# Patient Record
Sex: Female | Born: 1965
Health system: Southern US, Community
[De-identification: ages and names within clinical notes are randomized; demographics above are authoritative.]

## PROBLEM LIST (undated history)

## (undated) DIAGNOSIS — F419 Anxiety disorder, unspecified: Secondary | ICD-10-CM

## (undated) DIAGNOSIS — G4700 Insomnia, unspecified: Secondary | ICD-10-CM

## (undated) DIAGNOSIS — T7840XA Allergy, unspecified, initial encounter: Secondary | ICD-10-CM

## (undated) DIAGNOSIS — I639 Cerebral infarction, unspecified: Secondary | ICD-10-CM

## (undated) DIAGNOSIS — E785 Hyperlipidemia, unspecified: Secondary | ICD-10-CM

## (undated) DIAGNOSIS — F329 Major depressive disorder, single episode, unspecified: Secondary | ICD-10-CM

## (undated) DIAGNOSIS — F32A Depression, unspecified: Secondary | ICD-10-CM

## (undated) DIAGNOSIS — I10 Essential (primary) hypertension: Secondary | ICD-10-CM

## (undated) HISTORY — DX: Anxiety disorder, unspecified: F41.9

## (undated) HISTORY — DX: Hyperlipidemia, unspecified: E78.5

## (undated) HISTORY — PX: OTHER SURGICAL HISTORY: SHX169

## (undated) HISTORY — DX: Allergy, unspecified, initial encounter: T78.40XA

## (undated) HISTORY — DX: Insomnia, unspecified: G47.00

## (undated) HISTORY — DX: Essential (primary) hypertension: I10

## (undated) HISTORY — DX: Major depressive disorder, single episode, unspecified: F32.9

## (undated) HISTORY — PX: BREAST BIOPSY: SHX20

## (undated) HISTORY — DX: Depression, unspecified: F32.A

## (undated) HISTORY — PX: CHOLECYSTECTOMY: SHX55

## (undated) HISTORY — DX: Cerebral infarction, unspecified: I63.9

---

## 1996-03-10 HISTORY — PX: OTHER SURGICAL HISTORY: SHX169

## 1997-10-17 ENCOUNTER — Encounter: Admission: RE | Admit: 1997-10-17 | Discharge: 1997-10-17 | Payer: Self-pay | Admitting: Family Medicine

## 1998-01-17 ENCOUNTER — Encounter: Admission: RE | Admit: 1998-01-17 | Discharge: 1998-01-17 | Payer: Self-pay | Admitting: Family Medicine

## 1998-02-07 ENCOUNTER — Emergency Department (HOSPITAL_COMMUNITY): Admission: EM | Admit: 1998-02-07 | Discharge: 1998-02-07 | Payer: Self-pay | Admitting: Emergency Medicine

## 1998-02-14 ENCOUNTER — Encounter: Admission: RE | Admit: 1998-02-14 | Discharge: 1998-02-14 | Payer: Self-pay | Admitting: Family Medicine

## 1998-06-21 ENCOUNTER — Encounter: Admission: RE | Admit: 1998-06-21 | Discharge: 1998-06-21 | Payer: Self-pay | Admitting: Family Medicine

## 1998-08-23 ENCOUNTER — Encounter: Admission: RE | Admit: 1998-08-23 | Discharge: 1998-08-23 | Payer: Self-pay | Admitting: Family Medicine

## 2003-03-11 HISTORY — PX: OTHER SURGICAL HISTORY: SHX169

## 2003-08-25 ENCOUNTER — Ambulatory Visit (HOSPITAL_COMMUNITY): Admission: RE | Admit: 2003-08-25 | Discharge: 2003-08-25 | Payer: Self-pay | Admitting: General Surgery

## 2004-02-28 ENCOUNTER — Ambulatory Visit: Payer: Self-pay | Admitting: Family Medicine

## 2004-02-28 ENCOUNTER — Ambulatory Visit (HOSPITAL_COMMUNITY): Admission: RE | Admit: 2004-02-28 | Discharge: 2004-02-28 | Payer: Self-pay | Admitting: Family Medicine

## 2004-03-10 HISTORY — PX: OTHER SURGICAL HISTORY: SHX169

## 2004-03-18 ENCOUNTER — Ambulatory Visit (HOSPITAL_COMMUNITY): Admission: RE | Admit: 2004-03-18 | Discharge: 2004-03-18 | Payer: Self-pay | Admitting: Family Medicine

## 2004-03-22 ENCOUNTER — Ambulatory Visit (HOSPITAL_COMMUNITY): Admission: RE | Admit: 2004-03-22 | Discharge: 2004-03-22 | Payer: Self-pay | Admitting: Thoracic Surgery

## 2004-03-26 ENCOUNTER — Encounter (INDEPENDENT_AMBULATORY_CARE_PROVIDER_SITE_OTHER): Payer: Self-pay | Admitting: *Deleted

## 2004-03-26 ENCOUNTER — Ambulatory Visit (HOSPITAL_COMMUNITY): Admission: RE | Admit: 2004-03-26 | Discharge: 2004-03-26 | Payer: Self-pay | Admitting: Thoracic Surgery

## 2004-03-29 ENCOUNTER — Ambulatory Visit: Payer: Self-pay | Admitting: Pulmonary Disease

## 2004-04-02 ENCOUNTER — Ambulatory Visit: Admission: RE | Admit: 2004-04-02 | Discharge: 2004-04-02 | Payer: Self-pay | Admitting: Pulmonary Disease

## 2004-04-02 ENCOUNTER — Encounter (INDEPENDENT_AMBULATORY_CARE_PROVIDER_SITE_OTHER): Payer: Self-pay | Admitting: Specialist

## 2004-04-02 ENCOUNTER — Encounter (INDEPENDENT_AMBULATORY_CARE_PROVIDER_SITE_OTHER): Payer: Self-pay | Admitting: *Deleted

## 2004-04-02 ENCOUNTER — Ambulatory Visit: Payer: Self-pay | Admitting: Pulmonary Disease

## 2004-04-04 ENCOUNTER — Ambulatory Visit: Payer: Self-pay | Admitting: Pulmonary Disease

## 2004-04-09 ENCOUNTER — Ambulatory Visit: Payer: Self-pay | Admitting: Pulmonary Disease

## 2004-04-22 ENCOUNTER — Ambulatory Visit: Payer: Self-pay

## 2004-05-03 ENCOUNTER — Ambulatory Visit: Payer: Self-pay | Admitting: Pulmonary Disease

## 2004-05-20 ENCOUNTER — Ambulatory Visit (HOSPITAL_COMMUNITY): Admission: RE | Admit: 2004-05-20 | Discharge: 2004-05-20 | Payer: Self-pay | Admitting: Pulmonary Disease

## 2004-06-28 ENCOUNTER — Ambulatory Visit: Payer: Self-pay | Admitting: Pulmonary Disease

## 2004-07-04 ENCOUNTER — Ambulatory Visit: Payer: Self-pay | Admitting: Family Medicine

## 2004-07-24 ENCOUNTER — Encounter (HOSPITAL_COMMUNITY): Admission: RE | Admit: 2004-07-24 | Discharge: 2004-08-23 | Payer: Self-pay | Admitting: Oncology

## 2004-07-24 ENCOUNTER — Encounter: Admission: RE | Admit: 2004-07-24 | Discharge: 2004-07-24 | Payer: Self-pay | Admitting: Oncology

## 2004-07-24 ENCOUNTER — Ambulatory Visit (HOSPITAL_COMMUNITY): Payer: Self-pay | Admitting: Oncology

## 2004-09-06 ENCOUNTER — Ambulatory Visit: Payer: Self-pay | Admitting: Internal Medicine

## 2004-09-06 ENCOUNTER — Ambulatory Visit: Payer: Self-pay | Admitting: Infectious Diseases

## 2004-09-06 ENCOUNTER — Encounter (INDEPENDENT_AMBULATORY_CARE_PROVIDER_SITE_OTHER): Payer: Self-pay | Admitting: Specialist

## 2004-09-06 ENCOUNTER — Inpatient Hospital Stay (HOSPITAL_COMMUNITY): Admission: RE | Admit: 2004-09-06 | Discharge: 2004-09-13 | Payer: Self-pay | Admitting: Thoracic Surgery

## 2004-09-18 ENCOUNTER — Encounter: Admission: RE | Admit: 2004-09-18 | Discharge: 2004-09-18 | Payer: Self-pay | Admitting: Thoracic Surgery

## 2004-10-03 ENCOUNTER — Ambulatory Visit: Payer: Self-pay | Admitting: Family Medicine

## 2004-10-04 ENCOUNTER — Ambulatory Visit: Payer: Self-pay | Admitting: Psychiatry

## 2004-10-09 ENCOUNTER — Encounter: Admission: RE | Admit: 2004-10-09 | Discharge: 2004-10-09 | Payer: Self-pay | Admitting: Thoracic Surgery

## 2004-10-30 ENCOUNTER — Ambulatory Visit: Payer: Self-pay | Admitting: Infectious Diseases

## 2004-11-12 ENCOUNTER — Encounter: Admission: RE | Admit: 2004-11-12 | Discharge: 2004-11-12 | Payer: Self-pay | Admitting: Thoracic Surgery

## 2004-11-14 ENCOUNTER — Ambulatory Visit: Payer: Self-pay | Admitting: Family Medicine

## 2004-12-23 ENCOUNTER — Ambulatory Visit: Payer: Self-pay | Admitting: Family Medicine

## 2005-01-21 ENCOUNTER — Ambulatory Visit (HOSPITAL_COMMUNITY): Admission: RE | Admit: 2005-01-21 | Discharge: 2005-01-21 | Payer: Self-pay | Admitting: Obstetrics and Gynecology

## 2005-02-24 ENCOUNTER — Emergency Department (HOSPITAL_COMMUNITY): Admission: EM | Admit: 2005-02-24 | Discharge: 2005-02-24 | Payer: Self-pay | Admitting: Emergency Medicine

## 2005-02-24 ENCOUNTER — Ambulatory Visit: Payer: Self-pay | Admitting: Orthopedic Surgery

## 2005-03-07 ENCOUNTER — Ambulatory Visit (HOSPITAL_COMMUNITY): Admission: RE | Admit: 2005-03-07 | Discharge: 2005-03-07 | Payer: Self-pay | Admitting: Orthopedic Surgery

## 2005-04-03 ENCOUNTER — Ambulatory Visit: Payer: Self-pay | Admitting: Orthopedic Surgery

## 2005-07-10 ENCOUNTER — Ambulatory Visit (HOSPITAL_COMMUNITY): Admission: RE | Admit: 2005-07-10 | Discharge: 2005-07-10 | Payer: Self-pay | Admitting: Family Medicine

## 2005-07-10 ENCOUNTER — Ambulatory Visit: Payer: Self-pay | Admitting: Family Medicine

## 2005-08-18 ENCOUNTER — Ambulatory Visit: Payer: Self-pay | Admitting: Family Medicine

## 2006-01-01 ENCOUNTER — Ambulatory Visit: Payer: Self-pay | Admitting: Family Medicine

## 2006-01-02 ENCOUNTER — Ambulatory Visit (HOSPITAL_COMMUNITY): Admission: RE | Admit: 2006-01-02 | Discharge: 2006-01-02 | Payer: Self-pay | Admitting: Family Medicine

## 2006-01-28 IMAGING — CT CT CHEST W/ CM
1 of 2 series · 14 of 32 positions shown, 18 images · IV contrast (CONTRAST)
Comparison: none

CLINICAL DATA: Pulmonary masses in the right lower lobe.  The patient has reportedly been biopsied twice with no diagnosis of malignancy.
CT CHEST WITH INTRAVENOUS CONTRAST:
There are multiple nodules of various sizes in the right lower lobe, unchanged since several prior exams. The right middle and upper lobes and the entire left lung are clear. There is no hilar or mediastinal adenopathy.  The heart size is normal.  There is no bony abnormality.   The visualized portion of the abdomen which includes the entire liver, spleen, pancreas, adrenal glands and the majority of each kidney appear normal.

[Series 9746: — · axial · 0.64mm/px · z∈[+1563,+1813]mm · 14 of 60 slices shown, 18 images]
[im 5/60  mediastinal]
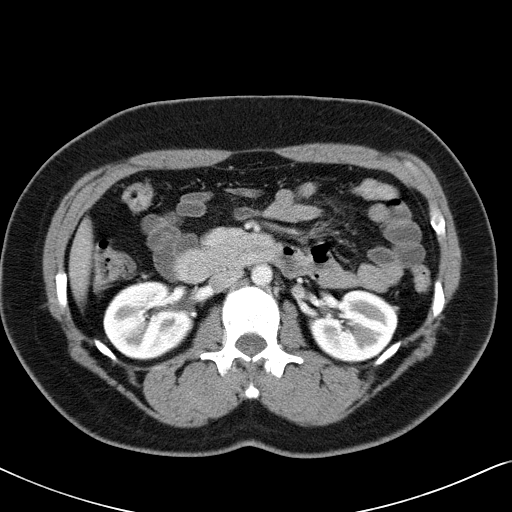
[im 5/60  lung]
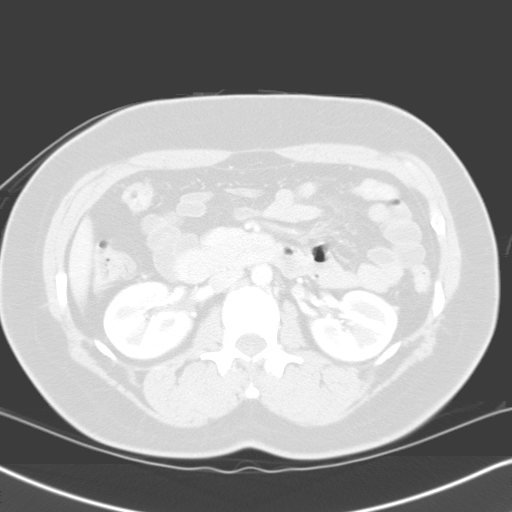
[im 10/60  lung]
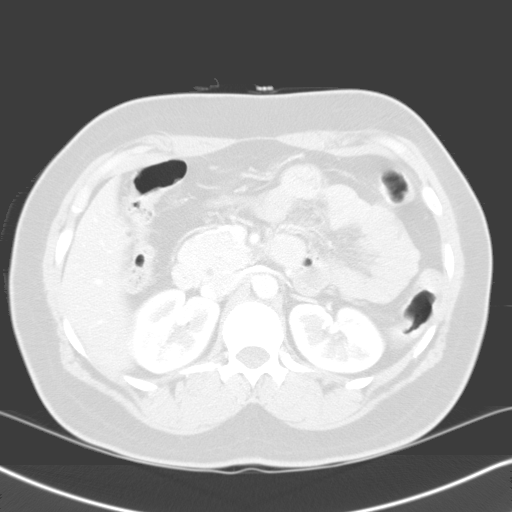
[im 14/60  lung]
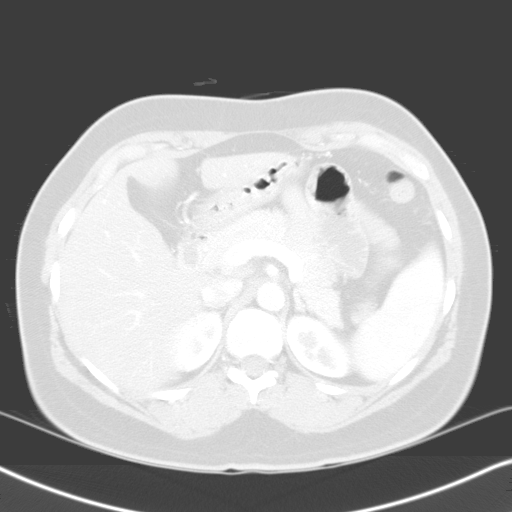
[im 19/60  lung]
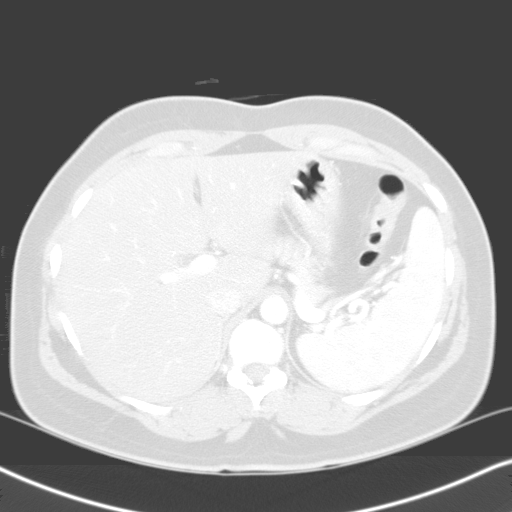
[im 23/60  mediastinal]
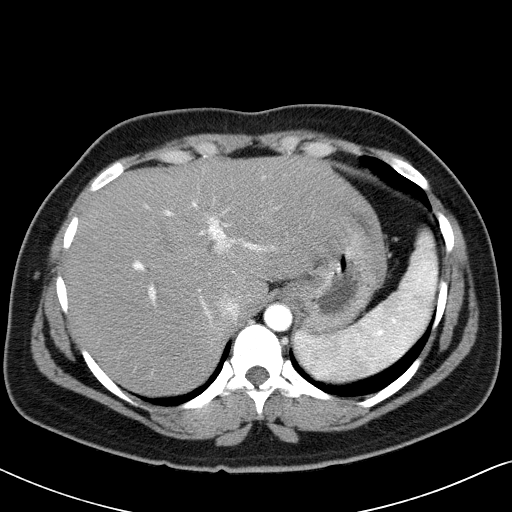
[im 23/60  lung]
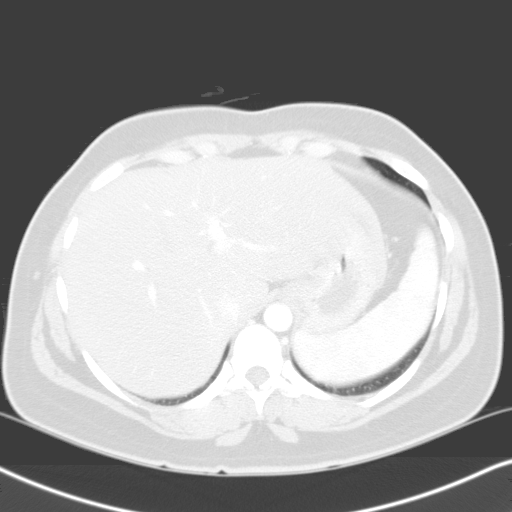
[im 28/60  lung]
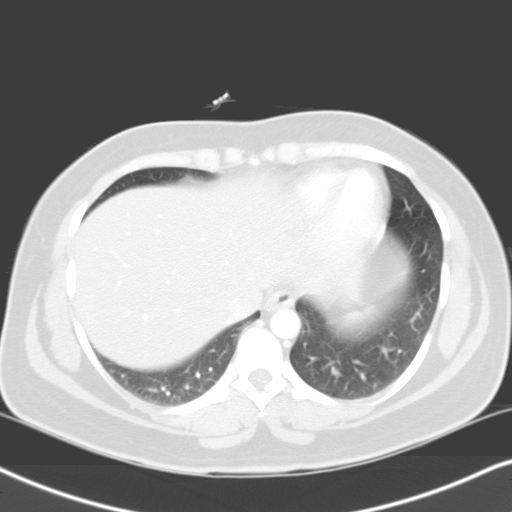
[im 29/60  lung]
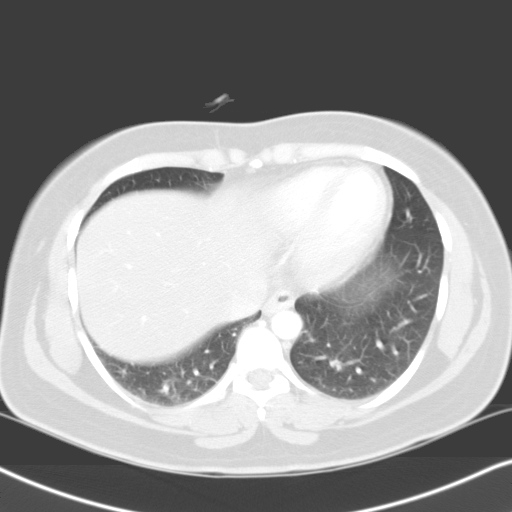
[im 30/60  lung]
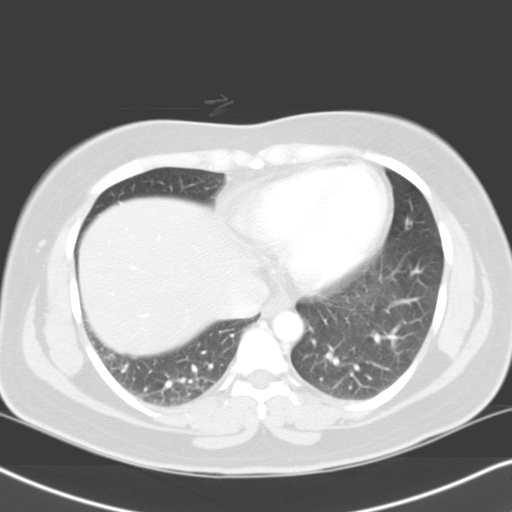
[im 32/60  mediastinal]
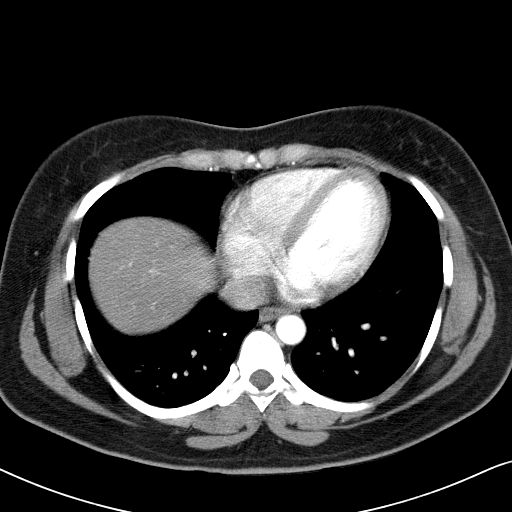
[im 32/60  lung]
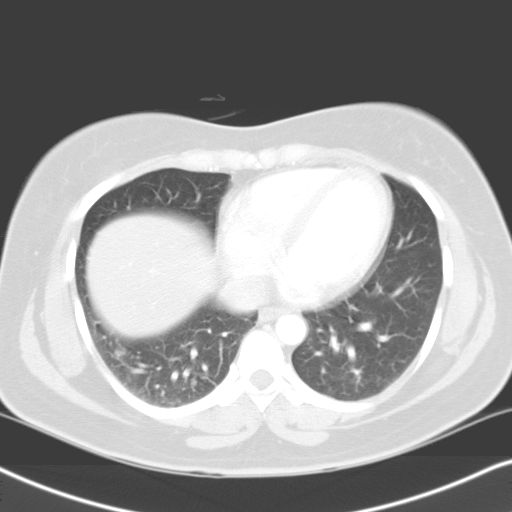
[im 37/60  lung]
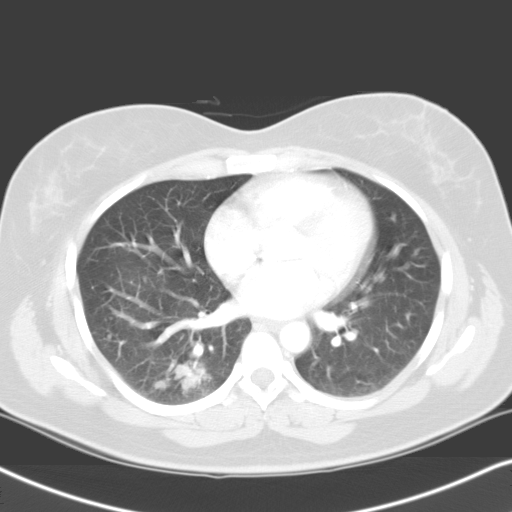
[im 41/60  lung]
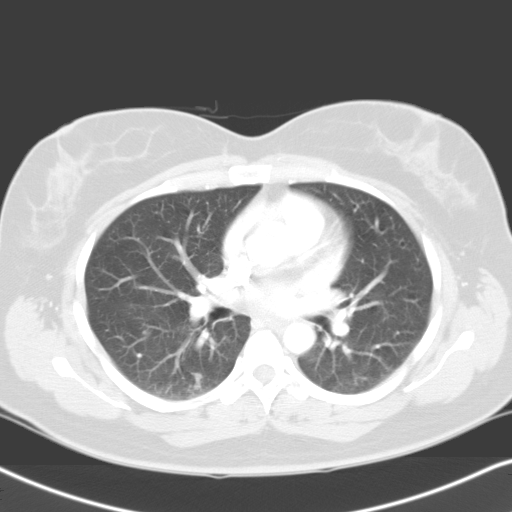
[im 46/60  lung]
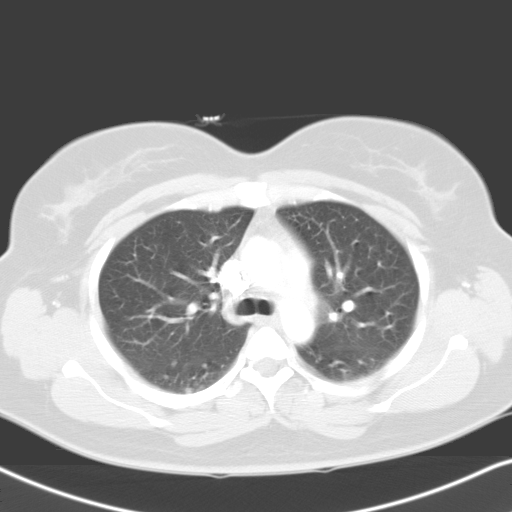
[im 50/60  mediastinal]
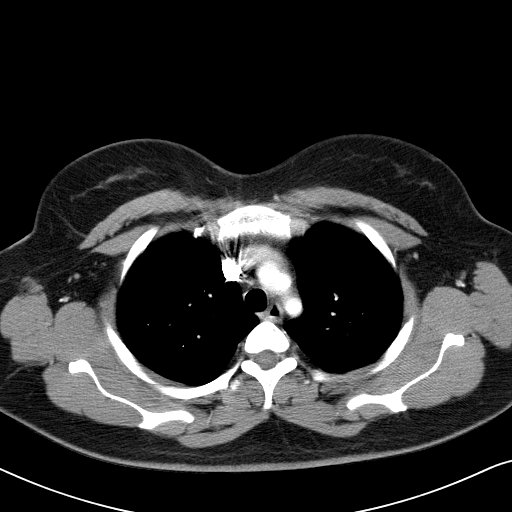
[im 50/60  lung]
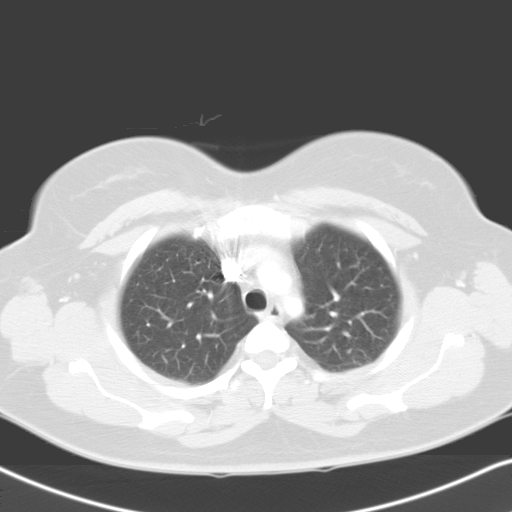
[im 55/60  lung]
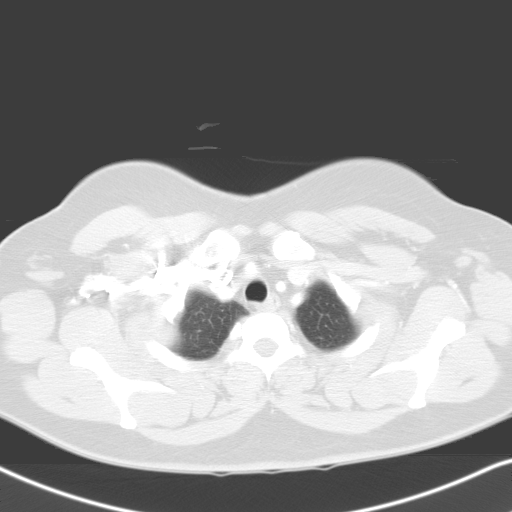

[14 of 32 positions shown; findings below may reference images not displayed]

IMPRESSION: Stable extensive pulmonary nodules in the right lower lobe. The findings are unusual, given their extent and stability and lack of adenopathy. However, an unusual malignancy should be considered.

## 2006-02-26 ENCOUNTER — Ambulatory Visit (HOSPITAL_COMMUNITY): Admission: RE | Admit: 2006-02-26 | Discharge: 2006-02-26 | Payer: Self-pay | Admitting: Obstetrics and Gynecology

## 2006-06-02 ENCOUNTER — Ambulatory Visit: Payer: Self-pay | Admitting: Family Medicine

## 2006-06-03 ENCOUNTER — Encounter: Payer: Self-pay | Admitting: Family Medicine

## 2006-06-30 ENCOUNTER — Ambulatory Visit: Payer: Self-pay | Admitting: Family Medicine

## 2007-02-02 ENCOUNTER — Ambulatory Visit: Payer: Self-pay | Admitting: Family Medicine

## 2007-02-03 ENCOUNTER — Encounter: Payer: Self-pay | Admitting: Family Medicine

## 2007-04-14 ENCOUNTER — Ambulatory Visit: Payer: Self-pay | Admitting: Family Medicine

## 2007-05-05 ENCOUNTER — Encounter: Payer: Self-pay | Admitting: Family Medicine

## 2007-05-05 LAB — CONVERTED CEMR LAB
AST: 14 units/L (ref 0–37)
Albumin: 4.3 g/dL (ref 3.5–5.2)
Alkaline Phosphatase: 80 units/L (ref 39–117)
Basophils Absolute: 0 10*3/uL (ref 0.0–0.1)
Basophils Relative: 0 % (ref 0–1)
CO2: 23 meq/L (ref 19–32)
Calcium: 9.2 mg/dL (ref 8.4–10.5)
Chloride: 105 meq/L (ref 96–112)
Eosinophils Absolute: 0.1 10*3/uL (ref 0.0–0.7)
Eosinophils Relative: 1 % (ref 0–5)
Glucose, Bld: 80 mg/dL (ref 70–99)
HCT: 39.5 % (ref 36.0–46.0)
LDL Cholesterol: 103 mg/dL — ABNORMAL HIGH (ref 0–99)
MCHC: 32.9 g/dL (ref 30.0–36.0)
MCV: 93.2 fL (ref 78.0–100.0)
Monocytes Absolute: 0.5 10*3/uL (ref 0.1–1.0)
RBC: 4.24 M/uL (ref 3.87–5.11)
Sodium: 138 meq/L (ref 135–145)
Total Bilirubin: 0.3 mg/dL (ref 0.3–1.2)
Total CHOL/HDL Ratio: 4
WBC: 9.8 10*3/uL (ref 4.0–10.5)

## 2007-05-06 ENCOUNTER — Ambulatory Visit: Payer: Self-pay | Admitting: Family Medicine

## 2007-05-26 ENCOUNTER — Ambulatory Visit (HOSPITAL_COMMUNITY): Admission: RE | Admit: 2007-05-26 | Discharge: 2007-05-26 | Payer: Self-pay | Admitting: Family Medicine

## 2007-05-26 ENCOUNTER — Ambulatory Visit: Payer: Self-pay | Admitting: Family Medicine

## 2007-05-29 ENCOUNTER — Telehealth (INDEPENDENT_AMBULATORY_CARE_PROVIDER_SITE_OTHER): Payer: Self-pay | Admitting: Internal Medicine

## 2007-05-30 ENCOUNTER — Emergency Department (HOSPITAL_COMMUNITY): Admission: EM | Admit: 2007-05-30 | Discharge: 2007-05-30 | Payer: Self-pay | Admitting: Emergency Medicine

## 2007-08-04 DIAGNOSIS — E785 Hyperlipidemia, unspecified: Secondary | ICD-10-CM | POA: Insufficient documentation

## 2007-08-04 DIAGNOSIS — F5105 Insomnia due to other mental disorder: Secondary | ICD-10-CM

## 2007-08-04 DIAGNOSIS — F409 Phobic anxiety disorder, unspecified: Secondary | ICD-10-CM | POA: Insufficient documentation

## 2007-08-04 DIAGNOSIS — R32 Unspecified urinary incontinence: Secondary | ICD-10-CM | POA: Insufficient documentation

## 2007-08-11 ENCOUNTER — Ambulatory Visit: Payer: Self-pay | Admitting: Family Medicine

## 2007-08-11 ENCOUNTER — Encounter: Payer: Self-pay | Admitting: Family Medicine

## 2007-09-07 ENCOUNTER — Ambulatory Visit (HOSPITAL_COMMUNITY): Admission: RE | Admit: 2007-09-07 | Discharge: 2007-09-07 | Payer: Self-pay | Admitting: Obstetrics and Gynecology

## 2007-10-18 ENCOUNTER — Other Ambulatory Visit: Admission: RE | Admit: 2007-10-18 | Discharge: 2007-10-18 | Payer: Self-pay | Admitting: Obstetrics and Gynecology

## 2007-11-24 ENCOUNTER — Ambulatory Visit: Payer: Self-pay | Admitting: Family Medicine

## 2007-12-24 ENCOUNTER — Emergency Department (HOSPITAL_COMMUNITY): Admission: EM | Admit: 2007-12-24 | Discharge: 2007-12-24 | Payer: Self-pay | Admitting: Emergency Medicine

## 2008-01-06 ENCOUNTER — Ambulatory Visit: Payer: Self-pay | Admitting: Family Medicine

## 2008-01-06 DIAGNOSIS — N3001 Acute cystitis with hematuria: Secondary | ICD-10-CM | POA: Insufficient documentation

## 2008-01-06 LAB — CONVERTED CEMR LAB
Glucose, Urine, Semiquant: NEGATIVE
Protein, U semiquant: NEGATIVE
Specific Gravity, Urine: 1.015
Urobilinogen, UA: 0.2

## 2008-01-07 ENCOUNTER — Encounter: Payer: Self-pay | Admitting: Family Medicine

## 2008-01-18 ENCOUNTER — Encounter: Payer: Self-pay | Admitting: Family Medicine

## 2008-02-23 ENCOUNTER — Ambulatory Visit: Payer: Self-pay | Admitting: Family Medicine

## 2008-02-23 DIAGNOSIS — R5383 Other fatigue: Secondary | ICD-10-CM

## 2008-02-23 DIAGNOSIS — R5381 Other malaise: Secondary | ICD-10-CM | POA: Insufficient documentation

## 2008-03-28 ENCOUNTER — Ambulatory Visit (HOSPITAL_COMMUNITY): Payer: Self-pay | Admitting: Psychiatry

## 2008-03-30 ENCOUNTER — Encounter: Payer: Self-pay | Admitting: Family Medicine

## 2008-05-04 ENCOUNTER — Ambulatory Visit (HOSPITAL_COMMUNITY): Payer: Self-pay | Admitting: Psychiatry

## 2008-05-09 ENCOUNTER — Telehealth: Payer: Self-pay | Admitting: Family Medicine

## 2008-06-22 ENCOUNTER — Encounter: Payer: Self-pay | Admitting: Family Medicine

## 2008-06-22 ENCOUNTER — Ambulatory Visit (HOSPITAL_COMMUNITY): Payer: Self-pay | Admitting: Psychiatry

## 2008-06-25 LAB — CONVERTED CEMR LAB
Albumin: 4.3 g/dL (ref 3.5–5.2)
Basophils Relative: 0 % (ref 0–1)
Bilirubin, Direct: 0.1 mg/dL (ref 0.0–0.3)
CO2: 24 meq/L (ref 19–32)
Calcium: 9.4 mg/dL (ref 8.4–10.5)
Cholesterol: 187 mg/dL (ref 0–200)
Eosinophils Relative: 2 % (ref 0–5)
Glucose, Bld: 100 mg/dL — ABNORMAL HIGH (ref 70–99)
HDL: 54 mg/dL (ref >39)
Lymphs Abs: 1.8 10*3/uL (ref 0.7–4.0)
MCHC: 33 g/dL (ref 30.0–36.0)
Monocytes Absolute: 0.4 10*3/uL (ref 0.1–1.0)
Monocytes Relative: 5 % (ref 3–12)
Neutro Abs: 4.4 10*3/uL (ref 1.7–7.7)
Neutrophils Relative %: 66 % (ref 43–77)
RBC: 4.28 M/uL (ref 3.87–5.11)
Total Bilirubin: 0.5 mg/dL (ref 0.3–1.2)
Total CHOL/HDL Ratio: 3.5
VLDL: 29 mg/dL (ref 0–40)
WBC: 6.7 10*3/uL (ref 4.0–10.5)

## 2008-06-26 ENCOUNTER — Ambulatory Visit: Payer: Self-pay | Admitting: Family Medicine

## 2008-07-01 DIAGNOSIS — E663 Overweight: Secondary | ICD-10-CM

## 2008-07-01 DIAGNOSIS — E66811 Obesity, class 1: Secondary | ICD-10-CM | POA: Insufficient documentation

## 2008-07-01 DIAGNOSIS — E669 Obesity, unspecified: Secondary | ICD-10-CM | POA: Insufficient documentation

## 2008-07-03 ENCOUNTER — Encounter: Payer: Self-pay | Admitting: Family Medicine

## 2008-07-28 ENCOUNTER — Encounter: Payer: Self-pay | Admitting: Family Medicine

## 2008-08-22 ENCOUNTER — Ambulatory Visit: Payer: Self-pay | Admitting: Family Medicine

## 2008-09-18 ENCOUNTER — Telehealth: Payer: Self-pay | Admitting: Family Medicine

## 2008-09-22 ENCOUNTER — Encounter: Payer: Self-pay | Admitting: Family Medicine

## 2008-12-07 ENCOUNTER — Ambulatory Visit: Payer: Self-pay | Admitting: Family Medicine

## 2008-12-10 DIAGNOSIS — F329 Major depressive disorder, single episode, unspecified: Secondary | ICD-10-CM | POA: Insufficient documentation

## 2008-12-10 DIAGNOSIS — F3289 Other specified depressive episodes: Secondary | ICD-10-CM | POA: Insufficient documentation

## 2008-12-21 ENCOUNTER — Telehealth: Payer: Self-pay | Admitting: Family Medicine

## 2008-12-28 ENCOUNTER — Encounter: Payer: Self-pay | Admitting: Family Medicine

## 2009-01-16 ENCOUNTER — Ambulatory Visit (HOSPITAL_COMMUNITY): Payer: Self-pay | Admitting: Psychiatry

## 2009-01-30 ENCOUNTER — Ambulatory Visit: Payer: Self-pay | Admitting: Family Medicine

## 2009-01-30 LAB — CONVERTED CEMR LAB
Bilirubin Urine: NEGATIVE
Glucose, Urine, Semiquant: NEGATIVE
Protein, U semiquant: NEGATIVE
Urobilinogen, UA: 0.2
pH: 6

## 2009-01-31 ENCOUNTER — Encounter: Payer: Self-pay | Admitting: Family Medicine

## 2009-02-08 ENCOUNTER — Ambulatory Visit (HOSPITAL_COMMUNITY): Payer: Self-pay | Admitting: Psychiatry

## 2009-03-19 ENCOUNTER — Telehealth: Payer: Self-pay | Admitting: Family Medicine

## 2009-05-03 ENCOUNTER — Ambulatory Visit: Payer: Self-pay | Admitting: Family Medicine

## 2009-05-03 ENCOUNTER — Encounter (INDEPENDENT_AMBULATORY_CARE_PROVIDER_SITE_OTHER): Payer: Self-pay

## 2009-05-03 DIAGNOSIS — J209 Acute bronchitis, unspecified: Secondary | ICD-10-CM | POA: Insufficient documentation

## 2009-06-08 ENCOUNTER — Ambulatory Visit (HOSPITAL_COMMUNITY): Admission: RE | Admit: 2009-06-08 | Discharge: 2009-06-08 | Payer: Self-pay | Admitting: Obstetrics and Gynecology

## 2009-06-14 ENCOUNTER — Ambulatory Visit (HOSPITAL_COMMUNITY): Payer: Self-pay | Admitting: Psychiatry

## 2009-08-01 ENCOUNTER — Other Ambulatory Visit: Admission: RE | Admit: 2009-08-01 | Discharge: 2009-08-01 | Payer: Self-pay | Admitting: Obstetrics and Gynecology

## 2009-08-15 ENCOUNTER — Ambulatory Visit: Payer: Self-pay | Admitting: Family Medicine

## 2009-08-16 ENCOUNTER — Encounter: Payer: Self-pay | Admitting: Family Medicine

## 2009-08-16 LAB — CONVERTED CEMR LAB
AST: 14 units/L (ref 0–37)
Albumin: 4.4 g/dL (ref 3.5–5.2)
Alkaline Phosphatase: 73 units/L (ref 39–117)
Basophils Relative: 0 % (ref 0–1)
CO2: 25 meq/L (ref 19–32)
Calcium: 9.3 mg/dL (ref 8.4–10.5)
Chloride: 100 meq/L (ref 96–112)
Creatinine, Ser: 0.73 mg/dL (ref 0.40–1.20)
Eosinophils Absolute: 0.1 10*3/uL (ref 0.0–0.7)
Eosinophils Relative: 2 % (ref 0–5)
Hemoglobin: 13.2 g/dL (ref 12.0–15.0)
Hgb A1c MFr Bld: 5.6 % (ref <5.7)
Indirect Bilirubin: 0.3 mg/dL (ref 0.0–0.9)
Lymphocytes Relative: 35 % (ref 12–46)
Lymphs Abs: 2.6 10*3/uL (ref 0.7–4.0)
Potassium: 4 meq/L (ref 3.5–5.3)
RBC: 4.21 M/uL (ref 3.87–5.11)
RDW: 13.6 % (ref 11.5–15.5)
Sodium: 137 meq/L (ref 135–145)
Total Bilirubin: 0.4 mg/dL (ref 0.3–1.2)
Total CHOL/HDL Ratio: 4.3
VLDL: 37 mg/dL (ref 0–40)
WBC: 7.4 10*3/uL (ref 4.0–10.5)

## 2009-08-17 ENCOUNTER — Encounter: Payer: Self-pay | Admitting: Family Medicine

## 2009-10-11 ENCOUNTER — Encounter: Payer: Self-pay | Admitting: Family Medicine

## 2009-11-16 ENCOUNTER — Ambulatory Visit: Payer: Self-pay | Admitting: Family Medicine

## 2009-11-16 DIAGNOSIS — E1159 Type 2 diabetes mellitus with other circulatory complications: Secondary | ICD-10-CM | POA: Insufficient documentation

## 2009-11-16 DIAGNOSIS — R7303 Prediabetes: Secondary | ICD-10-CM

## 2009-11-16 DIAGNOSIS — E1149 Type 2 diabetes mellitus with other diabetic neurological complication: Secondary | ICD-10-CM | POA: Insufficient documentation

## 2009-11-16 DIAGNOSIS — E1169 Type 2 diabetes mellitus with other specified complication: Secondary | ICD-10-CM | POA: Insufficient documentation

## 2009-12-12 ENCOUNTER — Encounter: Payer: Self-pay | Admitting: Family Medicine

## 2010-01-07 ENCOUNTER — Telehealth: Payer: Self-pay | Admitting: Family Medicine

## 2010-01-08 ENCOUNTER — Encounter: Payer: Self-pay | Admitting: Family Medicine

## 2010-02-11 ENCOUNTER — Encounter: Payer: Self-pay | Admitting: Family Medicine

## 2010-02-11 ENCOUNTER — Telehealth: Payer: Self-pay | Admitting: Family Medicine

## 2010-03-18 ENCOUNTER — Encounter: Payer: Self-pay | Admitting: Family Medicine

## 2010-03-18 ENCOUNTER — Ambulatory Visit
Admission: RE | Admit: 2010-03-18 | Discharge: 2010-03-18 | Payer: Self-pay | Source: Home / Self Care | Attending: Family Medicine | Admitting: Family Medicine

## 2010-03-24 DIAGNOSIS — R5382 Chronic fatigue, unspecified: Secondary | ICD-10-CM | POA: Insufficient documentation

## 2010-03-24 DIAGNOSIS — G9332 Myalgic encephalomyelitis/chronic fatigue syndrome: Secondary | ICD-10-CM | POA: Insufficient documentation

## 2010-03-31 ENCOUNTER — Encounter: Payer: Self-pay | Admitting: Family Medicine

## 2010-03-31 ENCOUNTER — Encounter: Payer: Self-pay | Admitting: Thoracic Surgery

## 2010-03-31 ENCOUNTER — Encounter: Payer: Self-pay | Admitting: Obstetrics and Gynecology

## 2010-04-09 NOTE — Letter (Signed)
Summary: Out of Work  [facility]  [address]   [place], [place] [address]   Phone: [phone]  Fax: [phone]    May 03, 2009   Employee:  SHIRLEYMAE HAUTH    To Whom It May Concern:   For Medical reasons, please excuse the above named employee from work for the following dates:  Start:   05/03/2009  End:   05/07/2009 Without Restrictions  If you need additional information, please feel free to contact our office.         Sincerely,    Milus Mallick. Lodema Hong, M.D.

## 2010-04-09 NOTE — Medication Information (Signed)
Summary: Tax adviser   Imported By: Lind Guest 01/09/2010 11:31:12  _____________________________________________________________________  External Attachment:    Type:   Image     Comment:   External Document

## 2010-04-09 NOTE — Progress Notes (Signed)
Summary: rx  Phone Note Call from Patient   Summary of Call: wants to know is her rx ready scott is in [place]  left message call let her know Initial call taken by: Lind Guest,  January 07, 2010 1:01 PM  Follow-up for Phone Call        called patient, no answer Follow-up by: Adella Hare LPN,  January 07, 2010 1:32 PM  Additional Follow-up for Phone Call Additional follow up Details #1::        called patient, no answer Additional Follow-up by: Adella Hare LPN,  January 08, 2010 8:58 AM

## 2010-04-09 NOTE — Progress Notes (Signed)
Summary: sick  Phone Note Call from Patient   Summary of Call: pt son has a stomach virus and she is getting it. would you please call her in [place]. [facility] [place]. [phone] Initial call taken by: Rudene Anda,  March 19, 2009 2:35 PM  Follow-up for Phone Call        adevise and erx phenergan 25mg  Take 1 tablet by mouth three times a day as needed #20 refill zero, al;so offer lomotil Take 1 tablet by mouth four times a day as needed #20 refill zero discuss bRAt, offer zofran 4mg  im x 1 if she needs it in am Follow-up by: Syliva Overman MD,  March 19, 2009 3:40 PM  Additional Follow-up for Phone Call Additional follow up Details #1::        rx sent , patient aware, states no diarrhea Additional Follow-up by: Worthy Keeler LPN,  March 19, 2009 3:43 PM    New/Updated Medications: PROMETHAZINE HCL 25 MG TABS (PROMETHAZINE HCL) one tab by mouth three times a day as needed Prescriptions: PROMETHAZINE HCL 25 MG TABS (PROMETHAZINE HCL) one tab by mouth three times a day as needed  #20 x 0   Entered by:   Worthy Keeler LPN   Authorized by:   Syliva Overman MD   Signed by:   Worthy Keeler LPN on 14/78/2956   Method used:   Electronically to        [facility]* (retail)       [address]       [address] Shipping/mailing       [place], [place]  [address]       Ph: [phone]       Fax: [phone]   RxID:   [id number]

## 2010-04-09 NOTE — Assessment & Plan Note (Signed)
Summary: FEVER, COUGH, BRON   Vital Signs:  Patient profile:   45 year old female Menstrual status:  regular Height:      60 inches Weight:      159 pounds BMI:     31.16 O2 Sat:      95 % Temp:     98.8 degrees F oral Pulse rate:   106 / minute Pulse rhythm:   regular Resp:     16 per minute BP sitting:   122 / 84  (left arm) Cuff size:   regular  Vitals Entered By: Everitt Amber LPN (May 03, 2009 10:45 AM)  Nutrition Counseling: Patient's BMI is greater than 25 and therefore counseled on weight management options. CC: cough, chest tightness, producing yellowish thick phlegm, fever started yesterday. Been feeling bad for a week now. Coughing so bad at night she can't sleep   CC:  cough, chest tightness, producing yellowish thick phlegm, and fever started yesterday. Been feeling bad for a week now. Coughing so bad at night she can't sleep.  History of Present Illness: 1 week h/o cough , chills fever , Ribs hurt alot from coughing, using xopenex she has available to her chest is tight, yellow sputum. No sinus pressure, sore throat or ear pain. She still has chronic fatigue, was recently changed by psych towellbutrin, no improvement noted, wants to resumephentermine.  Allergies: No Known Drug Allergies  Review of Systems      See HPI General:  Complains of chills, fatigue, loss of appetite, malaise, and weakness. Eyes:  Denies discharge and red eye. ENT:  See HPI. CV:  Denies chest pain or discomfort, palpitations, and swelling of feet. Resp:  Complains of cough and sputum productive. GI:  Denies abdominal pain, constipation, diarrhea, nausea, and vomiting blood. GU:  Denies dysuria and urinary frequency. MS:  Complains of mid back pain; denies stiffness. Neuro:  Denies headaches, seizures, and sensation of room spinning. Psych:  Denies suicidal thoughts/plans, thoughts of violence, and unusual visions or sounds.  Physical Exam  General:  alert, well-developed, and  well-nourished.  ill appearing, coughing repeatedly. HEENT: No facial asymmetry,  EOMI, No sinus tenderness, TM's Clear, oropharynx  pink and moist.   Chest: decreased air entry, bilateral crackles , few wheezes CVS: S1, S2, No murmurs, No S3.   Abd: Soft, Nontender.  MS: Adequate ROM spine, hips, shoulders and knees.  Ext: No edema.   CNS: CN 2-12 intact, power tone and sensation normal throughout.   Skin: Intact, no visible lesions or rashes.  Psych: Good eye contact, normal affect.  Memory intact, not anxious or depressed appearing.    Impression & Recommendations:  Problem # 1:  ACUTE BRONCHITIS (ICD-466.0) Assessment Comment Only  Her updated medication list for this problem includes:    Ciprofloxacin Hcl 500 Mg Tabs (Ciprofloxacin hcl) ..... One tab by mouth bid    Penicillin V Potassium 500 Mg Tabs (Penicillin v potassium) .Marland Kitchen... Take 1 tablet by mouth three times a day    Tessalon Perles 100 Mg Caps (Benzonatate) .Marland Kitchen... Take 1 capsule by mouth three times a day    Tussionex Pennkinetic Er 8-10 Mg/66ml Lqcr (Chlorpheniramine-hydrocodone) ..... One teaspoon twice daily  Orders: Rocephin  250mg  (Y1017) Depo- Medrol 80mg  (J1040) Admin of Therapeutic Inj  intramuscular or subcutaneous (51025)  Problem # 2:  OBESITY (ICD-278.00) Assessment: Unchanged  Ht: 60 (05/03/2009)   Wt: 159 (05/03/2009)   BMI: 31.16 (05/03/2009)  Problem # 3:  FATIGUE (ICD-780.79) Assessment: Unchanged  Orders:  T-Basic Metabolic Panel [id number]) T-CBC w/Diff [id number]) T-TSH [phone])  Problem # 4:  INSOMNIA (ICD-780.52) Assessment: Deteriorated  Her updated medication list for this problem includes:    Restoril 15 Mg Caps (Temazepam) .Marland Kitchen... Take 1 capsule by mouth at bedtime  Discussed sleep hygiene.   Complete Medication List: 1)  Enablex 15 Mg Tb24 (Darifenacin hydrobromide) .... Take 1 tablet by mouth once a day 2)  Furosemide 20 Mg Tabs (Furosemide) .... Take 1 tablet by mouth  once a day as needed 3)  Prempro 0.45-1.5 Mg Tabs (Conj estrog-medroxyprogest ace) .... Take 1 tablet by mouth once a day 4)  Restoril 15 Mg Caps (Temazepam) .... Take 1 capsule by mouth at bedtime 5)  Venlafaxine Hcl 75 Mg Tabs (Venlafaxine hcl) .... One tab by mouth bid 6)  Phentermine Hcl 37.5 Mg Tabs (Phentermine hcl) .... Take 1 tablet by mouth once a day 7)  Ciprofloxacin Hcl 500 Mg Tabs (Ciprofloxacin hcl) .... One tab by mouth bid 8)  Fluconazole 150 Mg Tabs (Fluconazole) .... One tab by mouth 9)  Promethazine Hcl 25 Mg Tabs (Promethazine hcl) .... One tab by mouth three times a day as needed 10)  Penicillin V Potassium 500 Mg Tabs (Penicillin v potassium) .... Take 1 tablet by mouth three times a day 11)  Tessalon Perles 100 Mg Caps (Benzonatate) .... Take 1 capsule by mouth three times a day 12)  Prednisone (pak) 5 Mg Tabs (Prednisone) .... Use as directed 13)  Phentermine Hcl 37.5 Mg Caps (Phentermine hcl) .... Take 1 tablet by mouth once a day 14)  Tussionex Pennkinetic Er 8-10 Mg/63ml Lqcr (Chlorpheniramine-hydrocodone) .... One teaspoon twice daily  Other Orders: T-Hepatic Function [id number]) T-Lipid Profile [id number]) T-Vitamin D (25-Hydroxy) [id number])  Patient Instructions: 1)  Please schedule a follow-up appointment in 3 .5months. 2)  It is important that you exercise regularly at least 20 minutes 5 times a week. If you develop chest pain, have severe difficulty breathing, or feel very tired , stop exercising immediately and seek medical attention. 3)  You need to lose weight. Consider a lower calorie diet and regular exercise. 4)  you are being treated for bronchitis injections in the office and meds sent in 5)  BMP prior to visit, ICD-9: 6)  Hepatic Panel prior to visit, ICD-9: 7)  Lipid Panel prior to visit, ICD-9: 8)  TSH prior to visit, ICD-9:   fasting last week in April  9)  CBC w/ Diff prior to visit, ICD-9: 10)  vit d level Prescriptions: TUSSIONEX  PENNKINETIC ER 8-10 MG/5ML LQCR (CHLORPHENIRAMINE-HYDROCODONE) one teaspoon twice daily  #300cc x 0   Entered and Authorized by:   Syliva Overman MD   Signed by:   Syliva Overman MD on 05/03/2009   Method used:   Printed then faxed to ...       [facility]* (retail)       [address]       [address] Shipping/mailing       [place], [place]  [address]       Ph: [phone]       Fax: [phone]   RxID:   [id number] PHENTERMINE HCL 37.5 MG CAPS (PHENTERMINE HCL) Take 1 tablet by mouth once a day  #30 x 1   Entered and Authorized by:   Syliva Overman MD   Signed by:   Syliva Overman MD on 05/03/2009   Method used:   Printed then faxed to .Marland KitchenMarland Kitchen  [facility]* (retail)       [address]       [address] Shipping/mailing       [place], [place]  [address]       Ph: [phone]       Fax: [phone]   RxID:   [id number] PREDNISONE (PAK) 5 MG TABS (PREDNISONE) Use as directed  #21 x 0   Entered and Authorized by:   Syliva Overman MD   Signed by:   Syliva Overman MD on 05/03/2009   Method used:   Electronically to        [facility]* (retail)       [address]       [address] Shipping/mailing       [place], [place]  [address]       Ph: [phone]       Fax: [phone]   RxID:   [id number] TESSALON PERLES 100 MG CAPS (BENZONATATE) Take 1 capsule by mouth three times a day  #30 x 0   Entered and Authorized by:   Syliva Overman MD   Signed by:   Syliva Overman MD on 05/03/2009   Method used:   Electronically to        [facility]* (retail)       [address]       [address] Shipping/mailing       [place], [place]  [address]       Ph: [phone]       Fax: [phone]   RxID:   [id number] PENICILLIN V POTASSIUM 500 MG TABS (PENICILLIN V POTASSIUM) Take 1 tablet by mouth three times a day  #30 x 0   Entered and Authorized by:   Syliva Overman MD    Signed by:   Syliva Overman MD on 05/03/2009   Method used:   Electronically to        [facility]* (retail)       [address]       [address] Shipping/mailing       [place], [place]  [address]       Ph: [phone]       Fax: [phone]   RxID:   [id number]    Medication Administration  Injection # 1:    Medication: Rocephin  250mg     Diagnosis: ACUTE BRONCHITIS (ICD-466.0)    Route: IM    Site: RUOQ gluteus    Exp Date: 06/2011    Lot #: [id number]    Mfr: [email or web address]    Comments: 500mg  given     Patient tolerated injection without complications    Given by: Everitt Amber LPN (May 03, 2009 11:35 AM)  Injection # 2:    Medication: Depo- Medrol 80mg     Diagnosis: ACUTE BRONCHITIS (ICD-466.0)    Route: IM    Site: LUOQ gluteus    Exp Date: 01/2010    Lot #: [id number]    Mfr: Pharmacia    Comments: 80mg  given     Patient tolerated injection without complications    Given by: Everitt Amber LPN (May 03, 2009 11:36 AM)  Orders Added: 1)  Est. Patient Level IV [10258] 2)  T-Basic Metabolic Panel [52778-24235] 3)  T-Hepatic Function [80076-22960] 4)  T-Lipid Profile [80061-22930] 5)  T-CBC w/Diff [36144-31540] 6)  T-TSH [08676-19509] 7)  T-Vitamin D (25-Hydroxy) [32671-24580] 8)  Rocephin  250mg  [J0696] 9)  Depo- Medrol 80mg  [J1040] 10)  Admin of Therapeutic Inj  intramuscular or subcutaneous [95284]

## 2010-04-09 NOTE — Miscellaneous (Signed)
Summary: NARC REFILL  Clinical Lists Changes  Medications: Rx of ADDERALL XR 10 MG XR24H-CAP (AMPHETAMINE-DEXTROAMPHETAMINE) One tab every morning;  #30 x 0;  Signed;  Entered by: Everitt Amber LPN;  Authorized by: Syliva Overman MD;  Method used: Handwritten    Prescriptions: ADDERALL XR 10 MG XR24H-CAP (AMPHETAMINE-DEXTROAMPHETAMINE) One tab every morning  #30 x 0   Entered by:   Everitt Amber LPN   Authorized by:   Syliva Overman MD   Signed by:   Everitt Amber LPN on 16/12/9602   Method used:   Handwritten   RxID:   [id number]

## 2010-04-09 NOTE — Assessment & Plan Note (Signed)
Summary: office visit   Vital Signs:  Patient profile:   45 year old female Menstrual status:  regular Height:      60 inches Weight:      155.13 pounds BMI:     30.41 O2 Sat:      98 % Pulse rate:   80 / minute Pulse rhythm:   regular Resp:     16 per minute BP sitting:   138 / 84  (left arm)  Vitals Entered By: Syliva Overman MD (August 15, 2009 10:39 AM)  Nutrition Counseling: Patient's BMI is greater than 25 and therefore counseled on weight management options. CC: Follow up chronic problems   CC:  Follow up chronic problems.  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 She denies overt depression symptoms, but has noted recent mopod instability.  She was last started on cymbalta, which she did not tolerate at all, in fact it seemed it made her more tearful.  She had a disagreement , with her treating psychiatrist who refused to treat chronic fatigue, he  wanted to have neuropsych evaluate her, she is unwilling to do this and neither wants to go back to him or to se another doc, she just wants to be treated through this office. She otherwise has no concerns and is doing well. She has modified her diet , is more active , has a Education officer, community and feels at home in her community. Work is going well, she works for 2 days per week.  Current Medications (verified): 1)  Restoril 15 Mg Caps (Temazepam) .... Take 1 Capsule By Mouth At Bedtime 2)  Phentermine Hcl 37.5 Mg Caps (Phentermine Hcl) .... Take 1 Tablet By Mouth Once A Day 3)  Est Estrogens-Methyltest Hs 0.625-1.25 Mg Tabs (Est Estrogens-Methyltest) .... Take 1 Tablet By Mouth Once A Day 4)  Zyrtec Hives Relief 10 Mg Tabs (Cetirizine Hcl) .... Take 1 Tablet By Mouth Once A Day  Allergies (verified): No Known Drug  Allergies  Review of Systems General:  Complains of fatigue and sleep disorder; denies chills and fever. Eyes:  Denies blurring and discharge. ENT:  Denies nasal congestion, postnasal drainage, sinus pressure, and sore throat. CV:  Denies chest pain or discomfort, difficulty breathing while lying down, palpitations, and swelling of feet. Resp:  Denies cough and sputum productive. GI:  Denies abdominal pain, constipation, diarrhea, nausea, and vomiting. GU:  Complains of incontinence; denies dysuria and urinary frequency; improved and less dependent on meds. MS:  Denies joint pain and stiffness. Derm:  Denies itching, lesion(s), and rash. Neuro:  Denies headaches, poor balance, seizures, and visual disturbances. Psych:  Complains of anxiety and depression; denies easily angered, suicidal thoughts/plans, thoughts of violence, and unusual visions or sounds; mild symptoms. Endo:  Denies cold intolerance, excessive hunger, excessive thirst, excessive urination, heat intolerance, polyuria, and weight change. Heme:  Denies abnormal bruising and enlarge lymph nodes. Allergy:  Complains of seasonal allergies; denies hives or rash and itching eyes; mild.  Physical Exam  General:  alert, well-developed, and well-nourished. HEENT: No facial asymmetry,  EOMI, No sinus tenderness, TM's Clear, oropharynx  pink and moist.   Chest: clrear to ascultation CVS: S1, S2, No murmurs, No S3.   Abd: Soft, Nontender.  MS: Adequate ROM spine, hips, shoulders and  knees.  Ext: No edema.   CNS: CN 2-12 intact, power tone and sensation normal throughout.   Skin: Intact, no visible lesions or rashes.  Psych: Good eye contact, normal affect.  Memory intact, not anxious or depressed appearing.    Impression & Recommendations:  Problem # 1:  FATIGUE (ICD-780.79) Assessment Deteriorated tril of adderall for chronic fatigue symptoms  Problem # 2:  DEPRESSION, CHRONIC (ICD-311) Assessment: Improved  The  following medications were removed from the medication list:    Venlafaxine Hcl 75 Mg Tabs (Venlafaxine hcl) ..... One tab by mouth bid Her updated medication list for this problem includes:    Sertraline Hcl 25 Mg Tabs (Sertraline hcl) .Marland Kitchen... Take 1 tablet by mouth once a day  Problem # 3:  URINARY INCONTINENCE (ICD-788.30) Assessment: Improved  Problem # 4:  OBESITY (ICD-278.00) Assessment: Improved  Ht: 60 (08/15/2009)   Wt: 155.13 (08/15/2009)   BMI: 30.41 (08/15/2009)  Problem # 5:  DYSLIPIDEMIA (ICD-272.4) Assessment: Comment Only  Labs Reviewed: SGOT: 15 (06/22/2008)   SGPT: 13 (06/22/2008)   HDL:54 (06/22/2008), 50 (05/05/2007)  LDL:104 (06/22/2008), 103 (05/05/2007)  Chol:187 (06/22/2008), 199 (05/05/2007)  Trig:144 (06/22/2008), 229 (05/05/2007), fasting labs past due, low fat diet discussed and encouraged  Complete Medication List: 1)  Restoril 15 Mg Caps (Temazepam) .... Take 1 capsule by mouth at bedtime 2)  Est Estrogens-methyltest Hs 0.625-1.25 Mg Tabs (Est estrogens-methyltest) .... Take 1 tablet by mouth once a day 3)  Zyrtec Hives Relief 10 Mg Tabs (Cetirizine hcl) .... Take 1 tablet by mouth once a day 4)  Sertraline Hcl 25 Mg Tabs (Sertraline hcl) .... Take 1 tablet by mouth once a day 5)  Adderall Xr 10 Mg Xr24h-cap (Amphetamine-dextroamphetamine) .... One tab every morning  Patient Instructions: 1)  Please schedule a follow-up appointment in 3 months. 2)  Med changes as discussed. Prescriptions: SERTRALINE HCL 25 MG TABS (SERTRALINE HCL) Take 1 tablet by mouth once a day  #90 x 0   Entered and Authorized by:   Syliva Overman MD   Signed by:   Syliva Overman MD on 08/15/2009   Method used:   Electronically to        [facility]* (retail)       [address]       [address] Shipping/mailing       [place], [place]  [address]       Ph: [phone]       Fax: [phone]   RxID:   [id number]

## 2010-04-09 NOTE — Letter (Signed)
Summary: narcotic contract  narcotic contract   Imported By: Lind Guest 08/17/2009 16:18:51  _____________________________________________________________________  External Attachment:    Type:   Image     Comment:   External Document

## 2010-04-09 NOTE — Miscellaneous (Signed)
Summary: NARC REFILL  Clinical Lists Changes  Medications: Rx of ADDERALL XR 10 MG XR24H-CAP (AMPHETAMINE-DEXTROAMPHETAMINE) One tab every morning;  #30 x 0;  Signed;  Entered by: Everitt Amber LPN;  Authorized by: Syliva Overman MD;  Method used: Historical    Prescriptions: ADDERALL XR 10 MG XR24H-CAP (AMPHETAMINE-DEXTROAMPHETAMINE) One tab every morning  #30 x 0   Entered by:   Everitt Amber LPN   Authorized by:   Syliva Overman MD   Signed by:   Everitt Amber LPN on 16/12/9602   Method used:   Historical   RxID:   [id number]

## 2010-04-09 NOTE — Assessment & Plan Note (Signed)
Summary: F UP   Vital Signs:  Patient profile:   45 year old female Menstrual status:  regular Height:      60 inches Weight:      159.75 pounds BMI:     31.31 O2 Sat:      98 % Pulse rate:   75 / minute Pulse rhythm:   regular Resp:     16 per minute BP sitting:   124 / 82  (left arm) Cuff size:   regular  Vitals Entered By: Everitt Amber LPN (November 16, 2009 8:28 AM)  Nutrition Counseling: Patient's BMI is greater than 25 and therefore counseled on weight management options. CC: Follow up chronic problems   CC:  Follow up chronic problems.  History of Present Illness: Reports  that she is much improved on adderall. She "fels normal". Sheis able to function without disabling fatigue. She has been involved with a Systems analyst, her diet has notbeen as good as it should have been, she is somewhat dosappointedwith her weight , but will strive to improve this.  Denies recent fever or chills. Denies sinus pressure, nasal congestion , ear pain or sore throat. Denies chest congestion, or cough productive of sputum. Denies chest pain, palpitations, PND, orthopnea or leg swelling. Denies abdominal pain, nausea, vomitting, diarrhea or constipation. Denies change in bowel movements or bloody stool. Denies dysuria , frequency, or hesitancy. Denies  joint pain, swelling, or reduced mobility. Denies headaches, vertigo, seizures. Denies depression, anxiety or insomnia.she is on medicatio for these Denies  rash, lesions, or itch.     Current Medications (verified): 1)  Restoril 15 Mg Caps (Temazepam) .... Take 1 Capsule By Mouth At Bedtime 2)  Est Estrogens-Methyltest Hs 0.625-1.25 Mg Tabs (Est Estrogens-Methyltest) .... Take 1 Tablet By Mouth Once A Day 3)  Zyrtec Hives Relief 10 Mg Tabs (Cetirizine Hcl) .... Take 1 Tablet By Mouth Once A Day 4)  Sertraline Hcl 25 Mg Tabs (Sertraline Hcl) .... Take 1 Tablet By Mouth Once A Day 5)  Adderall Xr 10 Mg Xr24h-Cap  (Amphetamine-Dextroamphetamine) .... One Tab Every Morning  Allergies (verified): No Known Drug Allergies  Review of Systems      See HPI General:  Complains of fatigue and sleep disorder. Eyes:  Denies blurring and discharge. GU:  Complains of incontinence; chronic stress incontinence. Psych:  Complains of anxiety and depression; denies mental problems, panic attacks, suicidal thoughts/plans, thoughts of violence, and unusual visions or sounds; improved, will try to taper off zoloft. Endo:  Denies excessive thirst and excessive urination. Heme:  Denies bleeding. Allergy:  Complains of seasonal allergies; denies hives or rash and itching eyes.  Physical Exam  General:  alert, well-developed, and well-nourished. HEENT: No facial asymmetry,  EOMI, No sinus tenderness, TM's Clear, oropharynx  pink and moist.   Chest: clrear to ascultation CVS: S1, S2, No murmurs, No S3.   Abd: Soft, Nontender.  MS: Adequate ROM spine, hips, shoulders and knees.  Ext: No edema.   CNS: CN 2-12 intact, power tone and sensation normal throughout.   Skin: Intact, no visible lesions or rashes.  Psych: Good eye contact, normal affect.  Memory intact, not anxious or depressed appearing.    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.21) Assessment Comment Only  Orders: T-Glucose, Blood (59563-87564) T- Hemoglobin A1C (33295-18841)  Labs Reviewed: Creat: 0.73 (08/15/2009)   Pt advised to reduce carbohydrate intake, espescially sweets, and to start regular physical activity, at least 30 minutes  5 days weekly, to enable weight loss, and reduce the risk of becoming diabetic   Problem # 2:  FATIGUE (ICD-780.79) Assessment: Improved  Problem # 3:  OBESITY (ICD-278.00) Assessment: Deteriorated  Ht: 60 (11/16/2009)   Wt: 159.75 (11/16/2009)   BMI: 31.31 (11/16/2009)  Problem # 4:  INSOMNIA (ICD-780.52) Assessment: Improved  Her updated medication list for this  problem includes:    Restoril 15 Mg Caps (Temazepam) .Marland Kitchen... Take 1 capsule by mouth at bedtime  Discussed sleep hygiene.   Problem # 5:  DYSLIPIDEMIA (ICD-272.4) Assessment: Improved  Labs Reviewed: SGOT: 14 (08/15/2009)   SGPT: 12 (08/15/2009)   HDL:36 (08/15/2009), 54 (06/22/2008)  LDL:81 (08/15/2009), 104 (34/74/2595)  Chol:154 (08/15/2009), 187 (06/22/2008)  Trig:187 (08/15/2009), 144 (06/22/2008) Low fat diet discussed and encouraged, and patient given low fat diet information also.  Complete Medication List: 1)  Restoril 15 Mg Caps (Temazepam) .... Take 1 capsule by mouth at bedtime 2)  Est Estrogens-methyltest Hs 0.625-1.25 Mg Tabs (Est estrogens-methyltest) .... Take 1 tablet by mouth once a day 3)  Zyrtec Hives Relief 10 Mg Tabs (Cetirizine hcl) .... Take 1 tablet by mouth once a day 4)  Sertraline Hcl 25 Mg Tabs (Sertraline hcl) .... Take 1 tablet by mouth once a day 5)  Adderall Xr 10 Mg Xr24h-cap (Amphetamine-dextroamphetamine) .... One tab every morning  Patient Instructions: 1)  F/U in mid January. 2)  It is important that you exercise regularly at least 40 minutes 6 times a week. If you develop chest pain, have severe difficulty breathing, or feel very tired , stop exercising immediately and seek medical attention. 3)  You need to lose weight. Consider a lower calorie diet and regular exercise.  4)  Fasting blood sugar and HBA1C October 9 or after 5)  You will get a 1500 cal diet sheet and also a low carb diet equivalent. 6)  Pls  reduce the zoloft to half tablet, daily for the next 4 weeks, if this works tnen discontinue the zoloft entirely after 1 month.  7)  If you do not feel as normal when you try to cut back the dose pls continue te way you are currently tasking it. 8)  Call and lv me a msg to contacyt you if you are able to stop the drug pls. 9)  Pls cut down on fats Prescriptions: SERTRALINE HCL 25 MG TABS (SERTRALINE HCL) Take 1 tablet by mouth once a day  #90 x  0   Entered by:   Everitt Amber LPN   Authorized by:   Syliva Overman MD   Signed by:   Everitt Amber LPN on 63/87/5643   Method used:   Printed then faxed to ...       [facility]* (retail)       [address]       [address]       [place], [place]  [address]       Ph: [phone]       Fax: [phone]   RxID:   [id number] RESTORIL 15 MG CAPS (TEMAZEPAM) Take 1 capsule by mouth at bedtime  #30 x 3   Entered by:   Everitt Amber LPN   Authorized by:   Syliva Overman MD   Signed by:   Everitt Amber LPN on 62/83/1517   Method used:   Printed then faxed to ...       [facility]* (retail)  [address]       [address]mail[address]       [place], [place]  [address]       Ph: [phone]       Fax: [phone]   RxID:   [id number] ADDERALL XR 10 MG XR24H-CAP (AMPHETAMINE-DEXTROAMPHETAMINE) One tab every morning  #30 x 0   Entered by:   Everitt Amber LPN   Authorized by:   Syliva Overman MD   Signed by:   Everitt Amber LPN on 84/13/2440   Method used:   Handwritten   RxID:   [id number]

## 2010-04-09 NOTE — Miscellaneous (Signed)
Summary: [facility] REFILL  Clinical Lists Changes  Medications: Added new medication of ADDERALL XR 10 MG XR24H-CAP (AMPHETAMINE-DEXTROAMPHETAMINE) One tab every morning - Signed Rx of ADDERALL XR 10 MG XR24H-CAP (AMPHETAMINE-DEXTROAMPHETAMINE) One tab every morning;  #30 x 0;  Signed;  Entered by: Everitt Amber LPN;  Authorized by: Syliva Overman MD;  Method used: Handwritten    Prescriptions: ADDERALL XR 10 MG XR24H-CAP (AMPHETAMINE-DEXTROAMPHETAMINE) One tab every morning  #30 x 0   Entered by:   Everitt Amber LPN   Authorized by:   Syliva Overman MD   Signed by:   Everitt Amber LPN on 16/12/9602   Method used:   Handwritten   RxID:   [id number]

## 2010-04-09 NOTE — Progress Notes (Signed)
Summary: Medication  Phone Note Call from Patient   Summary of Call: patient called wanted to pick up medication today for Adderrall Initial call taken by: Eugenio Hoes,  February 11, 2010 10:30 AM  Follow-up for Phone Call        script picked up today Follow-up by: Adella Hare LPN,  February 11, 2010 4:57 PM

## 2010-04-11 NOTE — Medication Information (Signed)
Summary: Tax adviser   Imported By: Lind Guest 03/18/2010 13:05:41  _____________________________________________________________________  External Attachment:    Type:   Image     Comment:   External Document

## 2010-04-11 NOTE — Assessment & Plan Note (Signed)
Summary: office visit   Vital Signs:  Patient profile:   45 year old female Menstrual status:  regular Height:      60 inches Weight:      158.25 pounds BMI:     31.02 O2 Sat:      99 % Pulse rate:   109 / minute Pulse rhythm:   regular Resp:     16 per minute BP sitting:   140 / 90  (left arm) Cuff size:   regular  Vitals Entered By: Everitt Amber LPN (March 18, 2010 9:13 AM)  Nutrition Counseling: Patient's BMI is greater than 25 and therefore counseled on weight management options. CC: Follow up visit   CC:  Follow up visit.  History of Present Illness: Reports  that she has been  doing well. She has noted however inc fatigue in the early afternioon, and actually uses her adderall on a as needed basis. She hjas also not been as active as previously. She did attempt to wean off her zoloft, but within 2 weeks she resumed this. Today she is tired, having worked all night, states her bP is normal when she checks it generally.  Denies recent fever or chills. Denies sinus pressure, nasal congestion , ear pain or sore throat. Denies chest congestion, or cough productive of sputum. Denies chest pain, palpitations, PND, orthopnea or leg swelling. Denies abdominal pain, nausea, vomitting, diarrhea or constipation. Denies change in bowel movements or bloody stool. Denies dysuria , frequency, incontinence or hesitancy.Has stopped med for incontiinencetemporarily, but anticipates the need for it once she resumes exercise. Denies  joint pain, swelling, or reduced mobility. Denies headaches, vertigo, seizures.  Denies  rash, lesions, or itch.     Current Medications (verified): 1)  Restoril 15 Mg Caps (Temazepam) .... Take 1 Capsule By Mouth At Bedtime 2)  Zyrtec Hives Relief 10 Mg Tabs (Cetirizine Hcl) .... Take 1 Tablet By Mouth Once A Day 3)  Sertraline Hcl 25 Mg Tabs (Sertraline Hcl) .... Take 1 Tablet By Mouth Once A Day 4)  Adderall Xr 10 Mg Xr24h-Cap  (Amphetamine-Dextroamphetamine) .... One Tab Every Morning  Allergies (verified): No Known Drug Allergies  Review of Systems      See HPI General:  Complains of fatigue and sleep disorder. Eyes:  Denies blurring and discharge. Psych:  Complains of anxiety and depression. Endo:  Denies cold intolerance, excessive hunger, excessive thirst, and excessive urination. Heme:  Denies abnormal bruising and bleeding. Allergy:  Complains of seasonal allergies; denies hives or rash and itching eyes.  Physical Exam  General:  Well-developed,well-nourished,in no acute distress; alert,appropriate and cooperative throughout examination HEENT: No facial asymmetry,  EOMI, No sinus tenderness, TM's Clear, oropharynx  pink and moist.   Chest: Clear to auscultation bilaterally.  CVS: S1, S2, No murmurs, No S3.   Abd: Soft, Nontender.  MS: Adequate ROM spine, hips, shoulders and knees.  Ext: No edema.   CNS: CN 2-12 intact, power tone and sensation normal throughout.   Skin: Intact, no visible lesions or rashes.  Psych: Good eye contact, normal affect.  Memory intact, not anxious or depressed appearing.    Impression & Recommendations:  Problem # 1:  DEPRESSION, CHRONIC (ICD-311) Assessment Unchanged  Her updated medication list for this problem includes:    Sertraline Hcl 25 Mg Tabs (Sertraline hcl) .Marland Kitchen... Take 1 tablet by mouth once a day  Problem # 2:  OBESITY (ICD-278.00) Assessment: Unchanged  Ht: 60 (03/18/2010)   Wt: 158.25 (03/18/2010)  BMI: 31.02 (03/18/2010) therapeutic lifestyle change discussed and encouraged  Problem # 3:  URINARY INCONTINENCE (ICD-788.30) Assessment: Improved  Problem # 4:  INSOMNIA (ICD-780.52) Assessment: Improved  Her updated medication list for this problem includes:    Restoril 15 Mg Caps (Temazepam) .Marland Kitchen... Take 1 capsule by mouth at bedtime  Discussed sleep hygiene.   Problem # 5:  CHRONIC FATIGUE SYNDROME (ICD-780.71) Assessment:  Deteriorated increased dose of adderall to be std  Complete Medication List: 1)  Restoril 15 Mg Caps (Temazepam) .... Take 1 capsule by mouth at bedtime 2)  Zyrtec Hives Relief 10 Mg Tabs (Cetirizine hcl) .... Take 1 tablet by mouth once a day 3)  Sertraline Hcl 25 Mg Tabs (Sertraline hcl) .... Take 1 tablet by mouth once a day 4)  Adderall Xr 15 Mg Xr24h-cap (Amphetamine-dextroamphetamine) .... Take 1 capsule by mouth once a day  Other Orders: T- Hemoglobin A1C ([id number])  Patient Instructions: 1)  Please schedule a follow-up appointment in 4 months. 2)  It is important that you exercise regularly at least 20 minutes 5 times a week. If you develop chest pain, have severe difficulty breathing, or feel very tired , stop exercising immediately and seek medical attention. 3)  You need to lose weight. Consider a lower calorie diet and regular exercise.  4)  Dose increase as discussed  Prescriptions: ADDERALL XR 15 MG XR24H-CAP (AMPHETAMINE-DEXTROAMPHETAMINE) Take 1 capsule by mouth once a day  #30 x 0   Entered by:   Adella Hare LPN   Authorized by:   Syliva Overman MD   Signed by:   Adella Hare LPN on 09/81/1914   Method used:   Handwritten   RxID:   [id number]    Orders Added: 1)  Est. Patient Level IV [[address]] 2)  T- Hemoglobin A1C [[address]-23375]

## 2010-04-12 NOTE — Medication Information (Signed)
Summary: Tax adviser   Imported By: Lind Guest 02/12/2010 09:33:15  _____________________________________________________________________  External Attachment:    Type:   Image     Comment:   External Document

## 2010-04-17 ENCOUNTER — Encounter: Payer: Self-pay | Admitting: Family Medicine

## 2010-04-25 NOTE — Medication Information (Signed)
Summary: Tax adviser   Imported By: Lind Guest 04/17/2010 11:09:14  _____________________________________________________________________  External Attachment:    Type:   Image     Comment:   External Document

## 2010-05-14 ENCOUNTER — Encounter: Payer: Self-pay | Admitting: Family Medicine

## 2010-05-21 NOTE — Miscellaneous (Signed)
Summary: [facility] REFILL  Clinical Lists Changes  Medications: Rx of ADDERALL XR 15 MG XR24H-CAP (AMPHETAMINE-DEXTROAMPHETAMINE) Take 1 capsule by mouth once a day;  #30 x 0;  Signed;  Entered by: Everitt Amber LPN;  Authorized by: Syliva Overman MD;  Method used: Handwritten    Prescriptions: ADDERALL XR 15 MG XR24H-CAP (AMPHETAMINE-DEXTROAMPHETAMINE) Take 1 capsule by mouth once a day  #30 x 0   Entered by:   Everitt Amber LPN   Authorized by:   Syliva Overman MD   Signed by:   Everitt Amber LPN on 51/88/4166   Method used:   Handwritten   RxID:   [id number]

## 2010-06-07 ENCOUNTER — Other Ambulatory Visit: Payer: Self-pay | Admitting: *Deleted

## 2010-06-07 ENCOUNTER — Other Ambulatory Visit: Payer: Self-pay

## 2010-06-07 MED ORDER — AMPHETAMINE-DEXTROAMPHET ER 10 MG PO CP24: 10.0000 mg | ORAL_CAPSULE | ORAL | Status: DC

## 2010-06-07 MED ORDER — AMPHETAMINE-DEXTROAMPHET ER 15 MG PO CP24: 15.0000 mg | ORAL_CAPSULE | ORAL | Status: DC

## 2010-07-05 ENCOUNTER — Other Ambulatory Visit: Payer: Self-pay

## 2010-07-05 MED ORDER — AMPHETAMINE-DEXTROAMPHET ER 15 MG PO CP24: 15.0000 mg | ORAL_CAPSULE | ORAL | Status: DC

## 2010-07-16 ENCOUNTER — Telehealth: Payer: Self-pay | Admitting: Family Medicine

## 2010-07-16 NOTE — Telephone Encounter (Signed)
Carmen Mcintyre picked up script

## 2010-07-23 ENCOUNTER — Other Ambulatory Visit: Payer: Self-pay | Admitting: Orthopedic Surgery

## 2010-07-23 DIAGNOSIS — IMO0002 Reserved for concepts with insufficient information to code with codable children: Secondary | ICD-10-CM

## 2010-07-23 MED ORDER — CEPHALEXIN 500 MG PO CAPS: 500.0000 mg | ORAL_CAPSULE | Freq: Four times a day (QID) | ORAL | Status: AC

## 2010-07-23 NOTE — Progress Notes (Signed)
Pain along the edge of the finger with tenderness and swelling   Advised to soak and take antibiotic

## 2010-07-26 NOTE — Op Note (Signed)
Carmen Mcintyre, Carmen Mcintyre                ACCOUNT NO.:  [id number]   MEDICAL RECORD NO.:  [id number]          PATIENT TYPE:  INP   LOCATION:  2899                         FACILITY:  [facility]   PHYSICIAN:  Ines Bloomer, M.D. DATE OF BIRTH:  Sep 04, 1965   DATE OF PROCEDURE:  09/06/2004  DATE OF DISCHARGE:                                 OPERATIVE REPORT   PREOPERATIVE DIAGNOSIS:  Multiple lesions right lower lobe.   POSTOPERATIVE DIAGNOSIS:  Necrotizing granulomatous process right lower  lobe.   OPERATION PERFORMED:  Right VATS, minithoracotomy, right lower lobectomy.   SURGEON:  Ines Bloomer, M.D.   FIRST ASSISTANT:  Rowe Clack, P.A.-C.   ANESTHESIA:  General.   DESCRIPTION OF PROCEDURE:  After percutaneous insertion of all monitoring  lines, the patient underwent general anesthesia, was prepped and draped in  the usual sterile manner.  She was turned to the right lateral thoracotomy  position.  A dual-lumen tube was inserted.  Two trocar sites were made in  the anterior and posterior axillary line at the seventh intercostal space.  Two trocars were inserted.  A 0-degree scope was inserted, and we could see  the lesions in the right lower lobe.  The rest of her lung appeared to be  normal.  There were actually multiple lesions in the right lower lobe, the  largest being 3 cm in size but extended from the superior segment and all  other basilar segments of one region.  A third incision was made over the  sixth intercostal space about 7 cm long.  The latissimus was partially  divided.  The serratus was split, and a small pediatric retractor was  inserted.  It was grasped with the [facility] ring forceps and resected with two  applications of the EZ45 stapler.  It was sent for culture, and on frozen  section revealed a necrotizing granulomatous process.  Because of this, it  was decided to do a right lower lobectomy, since we could not identify  really if this was the whole process or  not and because of the extensive  involvement of the right lower lobe.  The inferior pulmonary ligament was  taken down with electrocautery, and the inferior pulmonary vein was  dissected out, stapled, and divided with the Autosuture 30 white reticular.  Attention was then turned to the fissure, and dissection was carried down to  the pulmonary artery, dissecting out 11R nodes.  The superior portion of the  fissure was divided with the EZ45 stapler, and the pulmonary artery was  dissected up and stapled and divided with the Autosuture stapler.  The  inferior portion of the fissure was then divided with the EZ45 stapler.  This left the bronchus which was then stapled after making sure that the  right upper lobe and right lower lobe expanded.  Stapled and divided with  the EZ45 stapler.  The right lower lobe was removed.  CoSeal was applied to  the staple line.  Two chest tubes were run through the trocar sites and tied  in place with 0 silk.  Prior to placing  the chest tubes, though, a stab  wound was made at the posterior axillary line, and a tunneler was used to  tunnel in the __________ catheter, and this was done under direct vision  with the scope.  A Marcaine block was also  done.  Then, the chest was closed with two pericostals, #1 Vicryl in the  muscle layer, and 3-0 Vicryl as a subcuticular stitch.  The patient  tolerated the procedure well and was returned to the recovery room in stable  condition.       DPB/MEDQ  D:  09/06/2004  T:  09/06/2004  Job:  [id number]   cc:   Ines Bloomer, M.D.  [address]  [place]  [place] [address]

## 2010-07-26 NOTE — H&P (Signed)
Carmen Mcintyre, Carmen Mcintyre                          ACCOUNT NO.:  [id number]   MEDICAL RECORD NO.:  [id number]                   PATIENT TYPE:  AMB   LOCATION:  DAY                                  FACILITY:  [facility]   PHYSICIAN:  Dalia Heading, M.D.               DATE OF BIRTH:  [birth year]   DATE OF ADMISSION:  DATE OF DISCHARGE:                                HISTORY & PHYSICAL   CHIEF COMPLAINT:  Biliary colic.   HISTORY OF PRESENT ILLNESS:  The patient is a 45 year old white female who  is referred for evaluation and treatment of biliary colic secondary to  cholelithiasis.  She has been having intermittent episodes of right upper  quadrant abdominal pain with radiation to the right flank, nausea, bloating  for several weeks.  She was admitted to a hospital in [place] and found to  have cholelithiasis.  Some fatty food intolerance has been noted.  No fever,  chills or jaundice has been noted.  The symptoms have been worsening.   PAST MEDICAL HISTORY:  Unremarkable.   PAST SURGICAL HISTORY:  C sections.   CURRENT MEDICATIONS:  Phenergan p.r.n.   ALLERGIES:  No known drug allergies.   REVIEW OF SYSTEMS:  Noncontributory.   PHYSICAL EXAMINATION:  GENERAL APPEARANCE:  The patient is a well-developed,  well-nourished white female in no acute distress.  VITAL SIGNS:  Afebrile and vital signs are stable.  HEENT:  No scleral icterus.  LUNGS:  Clear to auscultation with good breath sounds bilaterally.  HEART:  Regular rate and rhythm without S3, S4 or murmurs.  ABDOMEN:  Soft with slight tenderness in the right upper quadrant to  palpation.  No hepatosplenomegaly, masses or hernias are identified.   STUDIES:  Ultrasound of the gallbladder reveals cholelithiasis with the  common bile duct being 5 mm.  Liver tests and CBC were within normal limits  in [place].   IMPRESSION:  Cholecystitis, cholelithiasis.   PLAN:  The patient was scheduled for laparoscopic cholecystectomy on August 25, 2003.  Risks and benefits of the procedure including bleeding,  infection, hepatobiliary injury and the possibility of an open procedure  were fully explained to the patient.  Gave informed consent.     ___________________________________________                                         Dalia Heading, M.D.   MAJ/MEDQ  D:  08/24/2003  T:  08/24/2003  Job:  [id number]

## 2010-07-26 NOTE — Op Note (Signed)
Carmen Mcintyre, Carmen Mcintyre                ACCOUNT NO.:  [id number]   MEDICAL RECORD NO.:  [id number]          PATIENT TYPE:  AMB   LOCATION:  CARD                         FACILITY[facility]  [facility]   PHYSICIAN:  Oley Balm. Sung Amabile, M.D. [facility] OF BIRTH:  1965/06/21   DATE OF PROCEDURE:  04/02/2004  DATE OF DISCHARGE:                                 OPERATIVE REPORT   PROCEDURE:  Bronchoscopy.   OPERATOR:  Oley Balm. Sung Amabile, M.D.   INDICATIONS FOR PROCEDURE:  Cavitary lesion in the right lower lobe.   SEDATION:  Versed 4 mg IV, fentanyl 100 mcg IV.   ANESTHESIA:  Topical anesthesia was applied to the nose and throat, and 40  mL of 1% lidocaine were used during the course of the procedure.   DESCRIPTION OF PROCEDURE:  After adequate sedation and anesthesia, the  bronchoscope was introduced via the right naris and advanced into the  posterior pharynx.  This demonstrated a normal upper airway anatomy.  The  vocal cords moved symmetrically.  Further anesthesia was achieved with 1%  lidocaine, and the scope was advanced into the trachea.  Complete airway  anesthesia was achieved with 1% lidocaine, and an airway examination was  performed.  This demonstrated normal bronchial mucosa, normal bronchial  anatomy and no endobronchial tumors, masses, or foreign bodies.  After the  completion of the airway examination, the bronchoscope was advanced into the  medial basilar segment of the right lower lobe, corresponding to the area of  abnormality on the CT scan of the chest.  Washings, brushings and trans-  bronchial biopsies were obtained in this segment.  There were no immediate  complications other than a cough.  There was minimal bleeding, which had  stopped by the time the scope was removed.  A follow-up chest x-ray is  pending.   SPECIMENS:  The bronchial specimens were sent for routine, AFB and fungal  studies, as well as cytology and surgical pathology.      DBS/MEDQ  D:  04/02/2004  T:  04/02/2004   Job:  [id number]   cc:   Milus Mallick. Lodema Hong, M.D.  [address]  [place], [place] [address]  Fax: [phone]   Ines Bloomer, M.D.  [address]  [place]  [place] [address]

## 2010-07-26 NOTE — H&P (Signed)
NAMEHIAWATHA, Carmen Mcintyre                ACCOUNT NO.:  [id number]   MEDICAL RECORD NO.:  [id number]          PATIENT TYPE:  INP   LOCATION:  NA                           FACILITY:  [facility]   PHYSICIAN:  Ines Bloomer, M.D. DATE OF BIRTH:  [birth year]   DATE OF ADMISSION:  09/05/2004  DATE OF DISCHARGE:                                HISTORY & PHYSICAL   PRIMARY DISCHARGE DIAGNOSIS:  Right lower lobe mass.   HISTORY AND PHYSICAL AND HOSPITAL COURSE:  Carmen Mcintyre is a pleasant 45-year-  old Caucasian female who had an episode of acute viral syndrome in December  of 2006.  Patient experienced aching and significant pain x2 days.  Also  experienced headache, fever and chills.  Chest x-ray was done at this time  showing a right lower lobe lesion.  CT scan done showed a 3 x 2.1 cm right  lower lobe mass with additional smaller masses in right lower lobe.  Two  others, one measuring 1.2 cm and the other measuring 1.0 cm.  Also noted a  lot of subcentimeter nodules.  No adenopathy noted.  PET scan done showed an  SUV to be 2.9.  Patient denies any weight loss, hemoptysis, excess sputum.  Noted, patient underwent video bronchoscopy by Dr. Edwyna Shell on March 22, 2004, showing no endobronchial lesions.  Patient states that several days  ago she had a tick bite and was placed on doxycycline due to her friend  coming back positive for rocky [place] spotted fever who was also bitten by  a tick same day as patient.   PAST MEDICAL HISTORY:  None.   PAST SURGICAL HISTORY:  1.  Cholecystectomy.  2.  C-section x2.   ALLERGIES:  NO KNOWN DRUG ALLERGIES.   MEDICATIONS:  1.  Ortho Tri-Cyclen p.o. daily.  2.  Lunesta 2 mg p.r.n.  3.  Doxycycline, patient unaware of dose but will be taking it for 7 days.  4.  Niravam 0.25 mg t.i.d.   SOCIAL HISTORY:  Currently patient married with 2 children, lives at home  with family.  She denies any tobacco, alcohol or illicit drug use.  Patient  currently employed,  working at [employer] as a Designer, jewellery on the ICU.  Currently drives.  She lives in [place].  No medical history in mother or  father __________.   REVIEW OF SYSTEMS:  See HPI for pertinent positives and negatives, otherwise  review of systems within normal limits.   PHYSICAL EXAM:  GENERAL:  A well-developed, well-nourished white female in  no acute distress.  VITAL SIGNS:  Blood pressure 120/64, pulse of 84, respirations of 18.  HEENT:  Normocephalic, atraumatic.  Pupils equal, round and reactive to  light and accommodation.  Extraocular movements intact. Oral mucosa is pink  and moist.  NECK:  Supple.  No carotid bruits noted.  RESPIRATORY:  Clear to auscultation bilaterally.  CARDIAC:  Regular rate and rhythm with S1 and S2 noted.  No murmurs, gallops  or rubs noted.  ABDOMEN:  Bowel sounds x4.  Soft, nontender on palpitation.  No  hepatosplenomegaly  noted.  GENITALIA:  Deferred.  RECTAL:  Deferred.  EXTREMITIES:  No edema, cyanosis or clubbing noted.  Patient has 2+  bilateral radial, femoral, dorsalis pedis and posterior tibial pulses noted.  NEUROLOGIC:  Cranial nerves II-XII intact.  Patient is alert and oriented  x4.  Gait steady.  Muscle strength 5/5 noted bilateral upper and lower  extremities.  Deep tendon reflexes 2+.   IMPRESSION AND PLAN:  Carmen Mcintyre is seen with a right lower lobe mass with PET  scan showing SUV to be 2.9.  Patient status post video bronchoscopy showing  no endobronchial lesions.  Patient seen and evaluated by Dr. Edwyna Shell.  Dr.  Edwyna Shell has discussed with the patient undergoing a right VATS with biopsy of  right lower lobe mass.  He then discussed with patient if frozen section  shows positive he may need to do a right lower lobectomy, converting the  right VATS into a right mini thoracotomy.  He discussed the risks and  benefits of this procedure.  Patient acknowledged her understanding and  wished to proceed.  She was scheduled to undergo VATS  biopsy of right lower  lobe mass with possible right lower lobectomy and mini thoracotomy on September 05, 2004.  Patient has already done her preadmission testing on September 03, 2004, at 9:00 a.m.      Ria Bush   KMD/MEDQ  D:  09/03/2004  T:  09/03/2004  Job:  [id number]

## 2010-07-26 NOTE — Op Note (Signed)
NAMEISLA, SABREE                ACCOUNT NO.:  [id number]   MEDICAL RECORD NO.:  [id number]          PATIENT TYPE:  OUT   LOCATION:  XRAY                         FACILITY:  [facility]   PHYSICIAN:  Ines Bloomer, M.D. DATE OF BIRTH:  05-03-1965   DATE OF PROCEDURE:  DATE OF DISCHARGE:  03/22/2004                                 OPERATIVE REPORT   PREOPERATIVE DIAGNOSIS:  Right lower lobe inflammatory lesions.   POSTOPERATIVE DIAGNOSIS:  Right lower lobe inflammatory lesions.   OPERATION PERFORMED:  Video bronchoscopy.   SURGEON:  Ines Bloomer, M.D.   ANESTHESIA:  General.  Local anesthesia with Cetacaine and Xylocaine.   After IV sedation and local anesthesia with Cetacaine and Xylocaine, the  fiberoptic bronchoscope was passed through the mouth and the cords were  normal.  The carina was in the midline.  The left main stem, left upper lobe  and left lower lobe orifices were normal.  The right upper lobe orifice and  right main stem bronchus, the right and middle lobe and right lower lobe  orifices were also normal.  No endobronchial lesions were seen.  Brushings  were taken and washings were taken from the right lobe.  The bronchoscope  was removed.  The patient returned to the recovery room in stable condition.       DPB/MEDQ  D:  03/26/2004  T:  03/26/2004  Job:  [id number]

## 2010-07-26 NOTE — Discharge Summary (Signed)
Carmen Mcintyre, Carmen Mcintyre                ACCOUNT NO.:  [id number]   MEDICAL RECORD NO.:  [id number]          PATIENT TYPE:  INP   LOCATION:  3310                         FACILITY:  [facility]   PHYSICIAN:  Carmen Mcintyre, M.D. DATE OF BIRTH:  1965-09-20   DATE OF ADMISSION:  09/06/2004  DATE OF DISCHARGE:  09/13/2004                                 DISCHARGE SUMMARY   HISTORY OF PRESENT ILLNESS:  The patient is a 45 year old Caucasian female  who had an episode of an acute viral syndrome in December __________.  She  experienced aching and significant pain x2 days, associated with headaches,  fever and chills.  Chest x-ray done at that time showed a right lower lobe  lesion.  CT scan showed a 3 x 2.1 cm right lower lobe mass with additional  smaller masses in the right lower lobe.  Two others, one measuring 1.2 and  the other measuring 1.0 cm.  Also noted was some sub-centimeter nodules.  No  adenopathy was noted.  A PET scan showed an SUV to be 2.9.  The patient  denied weight loss, hemoptysis, excess sputum.  The patient underwent video  bronchoscopy by Dr. Edwyna Shell on September 19, 2004 showing no endobronchial  lesions.  The patient also had a history of a recent tick bite and was  placed on Doxycycline.  She was seen in thoracic surgical consultation by  Dr. Edwyna Shell who recommended admitting the patient for video-assisted  thoracoscopy and possible thoracotomy for resection of these lesions.   PAST MEDICAL HISTORY:  No other significant.   PAST SURGICAL HISTORY:  1.  Cholecystectomy.  2.  C-section x2.   ALLERGIES:  NO KNOWN DRUG ALLERGIES.   MEDICATIONS PRIOR TO ADMISSION:  1.  Ortho Tri-Cyclen p.o. daily.  2.  Lunesta 2 mg p.r.n.  3.  Doxycycline unaware of dose, but was taking for a 7-day course.  4.  NIRAVAM 0.25 mg t.i.d.   Family history, social history, review of systems and physical exam --  please see History and Physical done at the time of admission.   HOSPITAL COURSE:  The  patient was admitted electively and on September 06, 2004  at taken to the operating room where she underwent a right video-assisted  thoracoscopy with thoracotomy and right lower lobe wedge resection, right  lower lobe lobectomy and lymph node sampling.  Frozen sections revealed  necrotizing granulomas.  She tolerated the procedure well and was taken to  the postanesthesia care unit in stable condition.   POSTOPERATIVE HOSPITAL COURSE:  The patient was seen in Infectious Disease  consultation due to the granulomatous lung disease. Multiple tests and  cultures were done and final status was positive for cryptococcal disease.  The patient's routine lines, monitors and drainage devices were discontinued  in a standard fashion.  The patient was placed on a course of Diflucan for a  total of two weeks.  The patient's overall status was deemed to be  acceptable for discharge on September 13, 2004.   MEDICATIONS ON DISCHARGE:  Diflucan 400 mg daily for additional  11 days.  She was also instructed to continue her:  1.  Ortho Tri-Cyclen.  2.  Lunesta.  3.  NIRAVAM.  4.  For pain Darvocet-N 100 1-2 every 4-6 hours as needed.   INSTRUCTIONS:  The patient was to follow-up with Dr. Edwyna Shell on September 18, 2004  at 10:30.   OTHER INSTRUCTIONS:  The patient received written instructions regarding  medications, activity, diet, wound care, and followup.   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   OTHER DIAGNOSES:  As previously listed per the history.   CONDITION ON DISCHARGE:  Stable and improved.      Rowe Clack, P.A.-C.    ______________________________  Carmen Mcintyre, M.D.    Sherryll Burger  D:  12/11/2004  T:  12/11/2004  Job:  [id number]   cc:   Carmen Mcintyre, M.D.  [address]  [place]  [place] [address]   Lacretia Leigh. Ninetta Lights, M.D.  Fax: [phone]

## 2010-07-26 NOTE — Op Note (Signed)
Carmen Mcintyre, Carmen Mcintyre                          ACCOUNT NO.:  [id number]   MEDICAL RECORD NO.:  [id number]                   PATIENT TYPE:  AMB   LOCATION:  DAY                                  FACILITY:  [facility]   PHYSICIAN:  Dalia Heading, M.D.               DATE OF BIRTH:  11/27/1965   DATE OF PROCEDURE:  08/25/2003  DATE OF DISCHARGE:                                 OPERATIVE REPORT   PREOPERATIVE DIAGNOSIS:  Cholecystitis, cholelithiasis.   POSTOPERATIVE DIAGNOSIS:  Cholecystitis, cholelithiasis.   PROCEDURE:  Laparoscopic cholecystectomy   SURGEON:  Dalia Heading, M.D.   ANESTHESIA:  General endotracheal.   INDICATIONS:  The patient is a 45 year old white female who presents with  biliary colic secondary to cholelithiasis.  The risks and benefits of the  procedure including bleeding, infection, hepatobiliary injury, and the  possibility of an open procedure were fully explained to the patient, who  gave informed consent.   DESCRIPTION OF PROCEDURE:  The patient was placed in the supine position.  After induction of general endotracheal anesthesia, the abdomen was prepped  and draped using the usual sterile technique with Betadine.  Surgical site  confirmation was performed.   A supraumbilical incision was made down to the fascia.  A Veress needle was  introduced into the abdominal cavity and confirmation of placement was done  using the saline drop test.  The abdomen was then insufflated to 16 mmHg  pressure.  An 11-mm trocar was introduced into the abdominal cavity under  direct visualization without difficulty.  The patient was placed in reverse  Trendelenburg position and an additional 11-mm trocar was placed in the  epigastric region and 5-mm trocars were placed in the right upper quadrant  and right flank regions. The liver was inspected and noted to be within  normal limits.   The gallbladder was retracted superiorly and laterally.  The dissection was  begun  around the infundibulum of the gallbladder.  The cystic duct was first  identified.  Its juncture to the infundibulum fully identified.  Endoclips  were placed proximally and distally on the cystic duct; and the cystic duct  was divided.  This was likewise done on the cystic artery.  The gallbladder  was then freed away from the gallbladder fossa using Bovie electrocautery.  The gallbladder was delivered through the epigastric trocar site using an  EndoCatch bag.  The gallbladder fossa was inspected and no abnormal bleeding  or bile leakage was noted.  Surgicel was placed in the gallbladder fossa.  All fluid and air were then evacuated from the abdominal cavity prior to  removal of the trocars.   All wounds were irrigated with normal saline.  All wounds were injected with  0.5% Sensorcaine.  The supraumbilical fascia was reapproximated using an #0  Vicryl interrupted suture. All skin incisions were closed using staples.  Betadine ointment and dry sterile dressings were  applied.   All tape and needle counts correct at the end of the procedure.  The patient  was extubated in the operating room and went back to recovery room in awake  and stable condition.   COMPLICATIONS:  None.   SPECIMENS:  Gallbladder with stones.   BLOOD LOSS:  Minimal.      ___________________________________________                                            Dalia Heading, M.D.   MAJ/MEDQ  D:  08/25/2003  T:  08/26/2003  Job:  [id number]   cc:   Dalia Heading, M.D.  [address] [place]  [place] [address]  Fax: [phone]

## 2010-08-09 ENCOUNTER — Other Ambulatory Visit: Payer: Self-pay

## 2010-08-09 MED ORDER — AMPHETAMINE-DEXTROAMPHET ER 15 MG PO CP24: 15.0000 mg | ORAL_CAPSULE | Freq: Every day | ORAL | Status: DC

## 2010-08-12 ENCOUNTER — Encounter: Payer: Self-pay | Admitting: Orthopedic Surgery

## 2010-08-12 ENCOUNTER — Ambulatory Visit (INDEPENDENT_AMBULATORY_CARE_PROVIDER_SITE_OTHER): Payer: 59 | Admitting: Orthopedic Surgery

## 2010-08-12 VITALS — Ht 60.0 in | Wt 150.0 lb

## 2010-08-12 DIAGNOSIS — IMO0002 Reserved for concepts with insufficient information to code with codable children: Secondary | ICD-10-CM

## 2010-08-12 MED ORDER — SULFAMETHOXAZOLE-TRIMETHOPRIM 800-160 MG PO TABS: 1.0000 | ORAL_TABLET | Freq: Two times a day (BID) | ORAL | Status: DC

## 2010-08-12 MED ORDER — SULFAMETHOXAZOLE-TRIMETHOPRIM 800-160 MG PO TABS: 1.0000 | ORAL_TABLET | Freq: Two times a day (BID) | ORAL | Status: AC

## 2010-08-12 MED ORDER — HYDROCODONE-ACETAMINOPHEN 5-325 MG PO TABS: 1.0000 | ORAL_TABLET | ORAL | Status: DC | PRN

## 2010-08-12 NOTE — Progress Notes (Signed)
X-rays RIGHT hand.  Reason for x-ray, paronychia, RIGHT long finger.  Bony alignment is normal. There is no evidence of periosteal erosion. There is soft tissue swelling at the distal portion of the RIGHT long finger.  Impression soft tissue swelling, RIGHT long finger, distal aspect

## 2010-08-12 NOTE — Progress Notes (Signed)
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 ago patient noted swelling along the edge of the nail of the RIGHT long finger on the RIGHT hand. I ordered some Keflex for her and soaks she did not improve. The redness is tracking across the top of the finger.  She came in for evaluation complaining of increasing pain. Increasing swelling.  We did take an x-ray. There is no evidence of infection.  Onset gradual  No injury Duration 10 days  Treatments soaks and keflex 2/10 pain  Swelling    General: The patient is normally developed, with normal grooming and hygiene. There are no gross deformities. The body habitus is normal   CDV: The pulse and perfusion of the extremities are normal   LYMPH: There is no gross lymphadenopathy in the extremities   Skin: There are no rashes, ulcers or cafe-au-lait spot   Psyche: The patient is alert, awake and oriented.  Mood is normal   Neuro:  The coordination and balance are normal.  Sensation is normal. Reflexes are 2+ and equal   Musculoskeletal  RLF tender nail edge With redness and swelling consistent with paronychia. Joint is not involved.  A digital block was done with 5 cc of plain lidocaine on each side of the Finger.  The border of the nail was removed, and a paronychia was decompressed. There was no purulent drainage. A wick was placed. A dressing was placed.  She will start on Bactrim, and she was given Norco for pain.  Return tomorrow  for dressing change

## 2010-08-13 ENCOUNTER — Ambulatory Visit (INDEPENDENT_AMBULATORY_CARE_PROVIDER_SITE_OTHER): Payer: 59 | Admitting: Orthopedic Surgery

## 2010-08-13 DIAGNOSIS — IMO0002 Reserved for concepts with insufficient information to code with codable children: Secondary | ICD-10-CM

## 2010-08-13 NOTE — Progress Notes (Signed)
Status post removal of nail for paronychia.  Wound check.  Wound looks clean.  Patient on Bactrim twice a day.  New dressing applied. Patient given instructions to change daily and soak with his salt water and drop of discharge.  Return one week for wound check.

## 2010-08-20 ENCOUNTER — Ambulatory Visit: Payer: 59 | Admitting: Orthopedic Surgery

## 2010-08-21 ENCOUNTER — Ambulatory Visit (INDEPENDENT_AMBULATORY_CARE_PROVIDER_SITE_OTHER): Payer: 59 | Admitting: Orthopedic Surgery

## 2010-08-21 DIAGNOSIS — IMO0002 Reserved for concepts with insufficient information to code with codable children: Secondary | ICD-10-CM

## 2010-08-21 NOTE — Patient Instructions (Signed)
Finish antibiotics  Band aid dressing

## 2010-08-21 NOTE — Progress Notes (Signed)
Status post removal of nail for paronychia. June 4 Wound check.  Patient on Bactrim twice a day.  Followup visit  RIGHT ring finger paronychia status post removal of portion of the nail.  Other than some mild soreness. The redness has resolved. The drainage has resolved. The motion is normal. The finger looks good.  Recommend cover with a Band-Aid. Finish antibiotics call us back if any problems develop.

## 2010-09-09 ENCOUNTER — Other Ambulatory Visit: Payer: Self-pay

## 2010-09-09 MED ORDER — AMPHETAMINE-DEXTROAMPHET ER 15 MG PO CP24: 15.0000 mg | ORAL_CAPSULE | Freq: Every day | ORAL | Status: DC

## 2010-09-18 ENCOUNTER — Encounter: Payer: Self-pay | Admitting: Family Medicine

## 2010-09-20 ENCOUNTER — Other Ambulatory Visit: Payer: Self-pay | Admitting: Family Medicine

## 2010-09-24 ENCOUNTER — Ambulatory Visit (INDEPENDENT_AMBULATORY_CARE_PROVIDER_SITE_OTHER): Payer: 59 | Admitting: Family Medicine

## 2010-09-24 VITALS — BP 120/80 | HR 86 | Resp 14 | Ht 60.5 in | Wt 158.1 lb

## 2010-09-24 DIAGNOSIS — F411 Generalized anxiety disorder: Secondary | ICD-10-CM

## 2010-09-24 DIAGNOSIS — J302 Other seasonal allergic rhinitis: Secondary | ICD-10-CM

## 2010-09-24 DIAGNOSIS — G9332 Myalgic encephalomyelitis/chronic fatigue syndrome: Secondary | ICD-10-CM

## 2010-09-24 DIAGNOSIS — R7301 Impaired fasting glucose: Secondary | ICD-10-CM

## 2010-09-24 DIAGNOSIS — J309 Allergic rhinitis, unspecified: Secondary | ICD-10-CM

## 2010-09-24 DIAGNOSIS — F419 Anxiety disorder, unspecified: Secondary | ICD-10-CM | POA: Insufficient documentation

## 2010-09-24 DIAGNOSIS — G4700 Insomnia, unspecified: Secondary | ICD-10-CM

## 2010-09-24 DIAGNOSIS — R5382 Chronic fatigue, unspecified: Secondary | ICD-10-CM

## 2010-09-24 MED ORDER — ALPRAZOLAM 0.25 MG PO TABS: ORAL_TABLET | ORAL | Status: DC

## 2010-09-24 NOTE — Patient Instructions (Signed)
F/u in 6 months.  A healthy diet is rich in fruit, vegetables and whole grains. Poultry fish, nuts and beans are a healthy choice for protein rather then red meat. A low sodium diet and drinking 64 ounces of water daily is generally recommended. Oils and sweet should be limited. Carbohydrates especially for those who are diabetic or overweight, should be limited to 34-45 gram per meal. It is important to eat on a regular schedule, at least 3 times daily. Snacks should be primarily fruits, vegetables or nuts.   It is important that you exercise regularly at least 30 minutes 5 times a week. If you develop chest pain, have severe difficulty breathing, or feel very tired, stop exercising immediately and seek medical attention    pls get fasting lipid, chem 7 cbc and tSH in the fall and leave labs here.   hBA1C in 6 months, non fasting  MAMMOGRAM and pap are past due , please schedule asap

## 2010-09-26 ENCOUNTER — Encounter: Payer: Self-pay | Admitting: Family Medicine

## 2010-09-26 DIAGNOSIS — J302 Other seasonal allergic rhinitis: Secondary | ICD-10-CM | POA: Insufficient documentation

## 2010-09-26 NOTE — Assessment & Plan Note (Signed)
Mild symptoms currently, uses med as needed only

## 2010-09-26 NOTE — Assessment & Plan Note (Signed)
Reports no response to restoril, increased symptoms due to inc work hours and swing shifts, requests xanax as needed. Pt aware of potential addictive potential , and will be responsible in her use of the xanax

## 2010-09-26 NOTE — Progress Notes (Signed)
  Subjective:    Patient ID: Carmen Mcintyre, female    DOB: [birth year], 45 y.o.   MRN: [id number]  HPI The PT is here for follow up and re-evaluation of chronic medical conditions, medication management and review of recent lab and radiology data.  Preventive health is updated, specifically  Cancer screening,  and Immunization.    The PT denies any adverse reactions to current medications since the last visit. She has actually cut back significantly on medications which she takes regulalrly There are no new concerns.  There are no specific complaints with the exception of increased difficulty sleeping primarily due toinc workload and swing shifts.      Review of Systems Denies recent fever or chills. Denies sinus pressure, nasal congestion, ear pain or sore throat. Denies chest congestion, productive cough or wheezing. Denies chest pains, palpitations, paroxysmal nocturnal dyspnea, orthopnea and leg swelling Denies abdominal pain, nausea, vomiting,diarrhea or constipation.  Denies rectal bleeding or change in bowel movement. Denies dysuria, frequency, hesitancy or incontinence. Denies joint pain, swelling and limitation in mobility. Denies headaches, seizure, numbness, or tingling. Denies depression and has been off zoloft for approx 3 months, will call in the winter if she needs to resume Denies skin break down or rash.        Objective:   Physical Exam Patient alert and oriented and in no Cardiopulmonary distress.  HEENT: No facial asymmetry, EOMI, no sinus tenderness, TM's clear, Oropharynx pink and moist.  Neck supple no adenopathy.  Chest: Clear to auscultation bilaterally.  CVS: S1, S2 no murmurs, no S3.  ABD: Soft non tender. Bowel sounds normal.  Ext: No edema  MS: Adequate ROM spine, shoulders, hips and knees.  Skin: Intact, no ulcerations or rash noted.  Psych: Good eye contact, normal affect. Memory intact not anxious or depressed appearing.  CNS: CN 2-12  intact, power, tone and sensation normal throughout.        Assessment & Plan:

## 2010-09-26 NOTE — Assessment & Plan Note (Signed)
Excellent response to adderall which she will continue indefinitely

## 2010-09-26 NOTE — Assessment & Plan Note (Signed)
Unchanged, continued dietary change with weight loss needed, also needs to increase exercise

## 2010-10-14 ENCOUNTER — Other Ambulatory Visit: Payer: Self-pay

## 2010-10-14 MED ORDER — AMPHETAMINE-DEXTROAMPHET ER 15 MG PO CP24: 15.0000 mg | ORAL_CAPSULE | Freq: Every day | ORAL | Status: DC

## 2010-10-16 ENCOUNTER — Telehealth: Payer: Self-pay | Admitting: Family Medicine

## 2010-10-16 ENCOUNTER — Other Ambulatory Visit: Payer: Self-pay | Admitting: Family Medicine

## 2010-10-16 DIAGNOSIS — B001 Herpesviral vesicular dermatitis: Secondary | ICD-10-CM

## 2010-10-16 MED ORDER — ACYCLOVIR 400 MG PO TABS: 400.0000 mg | ORAL_TABLET | Freq: Every day | ORAL | Status: DC

## 2010-10-16 NOTE — Telephone Encounter (Signed)
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

## 2010-10-16 NOTE — Telephone Encounter (Signed)
noted 

## 2010-10-17 ENCOUNTER — Other Ambulatory Visit: Payer: Self-pay | Admitting: Family Medicine

## 2010-10-17 ENCOUNTER — Other Ambulatory Visit: Payer: Self-pay | Admitting: *Deleted

## 2010-10-17 ENCOUNTER — Telehealth: Payer: Self-pay | Admitting: *Deleted

## 2010-10-17 DIAGNOSIS — B001 Herpesviral vesicular dermatitis: Secondary | ICD-10-CM

## 2010-10-17 MED ORDER — ACYCLOVIR 5 % EX CREA: 1.0000 "application " | TOPICAL_CREAM | CUTANEOUS | Status: DC

## 2010-10-17 MED ORDER — ACYCLOVIR 400 MG PO TABS: 400.0000 mg | ORAL_TABLET | Freq: Every day | ORAL | Status: AC

## 2010-10-17 NOTE — Telephone Encounter (Signed)
Patient aware, med sent 

## 2010-10-17 NOTE — Telephone Encounter (Signed)
Cream entered historically pls send in [facility] pharmacy of her choice

## 2010-12-02 LAB — CBC
MCHC: 35.7
MCV: 90.3
Platelets: 268
RDW: 12.4

## 2010-12-02 LAB — DIFFERENTIAL
Basophils Absolute: 0
Basophils Relative: 0
Eosinophils Absolute: 0
Neutrophils Relative %: 82 — ABNORMAL HIGH

## 2010-12-02 LAB — URINALYSIS, ROUTINE W REFLEX MICROSCOPIC
Glucose, UA: NEGATIVE
Leukocytes, UA: NEGATIVE
Specific Gravity, Urine: 1.015
Urobilinogen, UA: 0.2

## 2010-12-02 LAB — BASIC METABOLIC PANEL
BUN: 10
CO2: 25
Calcium: 10.1
Chloride: 96
Creatinine, Ser: 0.7

## 2010-12-02 LAB — URINE MICROSCOPIC-ADD ON

## 2010-12-19 ENCOUNTER — Telehealth: Payer: Self-pay | Admitting: Family Medicine

## 2010-12-19 NOTE — Telephone Encounter (Signed)
Letter written pls type

## 2010-12-25 NOTE — Telephone Encounter (Signed)
Patient aware letter is typed

## 2011-01-07 ENCOUNTER — Telehealth: Payer: Self-pay | Admitting: Family Medicine

## 2011-01-10 ENCOUNTER — Other Ambulatory Visit: Payer: Self-pay

## 2011-01-10 MED ORDER — AMPHETAMINE-DEXTROAMPHET ER 15 MG PO CP24: 15.0000 mg | ORAL_CAPSULE | Freq: Every day | ORAL | Status: DC

## 2011-01-13 NOTE — Telephone Encounter (Signed)
Already collected  

## 2011-02-19 ENCOUNTER — Other Ambulatory Visit: Payer: Self-pay | Admitting: Obstetrics and Gynecology

## 2011-02-19 DIAGNOSIS — Z139 Encounter for screening, unspecified: Secondary | ICD-10-CM

## 2011-02-24 ENCOUNTER — Ambulatory Visit (HOSPITAL_COMMUNITY)
Admission: RE | Admit: 2011-02-24 | Discharge: 2011-02-24 | Disposition: A | Discharge status: Home / Self Care | Payer: 59 | Source: Ambulatory Visit | Attending: Obstetrics and Gynecology | Admitting: Obstetrics and Gynecology

## 2011-02-24 ENCOUNTER — Ambulatory Visit (HOSPITAL_COMMUNITY): Payer: 59

## 2011-02-24 DIAGNOSIS — Z139 Encounter for screening, unspecified: Secondary | ICD-10-CM

## 2011-02-24 DIAGNOSIS — Z1231 Encounter for screening mammogram for malignant neoplasm of breast: Secondary | ICD-10-CM | POA: Insufficient documentation

## 2011-02-27 ENCOUNTER — Ambulatory Visit (HOSPITAL_COMMUNITY): Payer: 59

## 2011-04-02 ENCOUNTER — Encounter: Payer: Self-pay | Admitting: Family Medicine

## 2011-04-04 ENCOUNTER — Encounter: Payer: Self-pay | Admitting: Family Medicine

## 2011-04-07 ENCOUNTER — Ambulatory Visit (INDEPENDENT_AMBULATORY_CARE_PROVIDER_SITE_OTHER): Payer: 59 | Admitting: Family Medicine

## 2011-04-07 ENCOUNTER — Ambulatory Visit: Payer: 59 | Admitting: Family Medicine

## 2011-04-07 ENCOUNTER — Encounter: Payer: Self-pay | Admitting: Family Medicine

## 2011-04-07 VITALS — BP 120/82 | HR 87 | Resp 16 | Ht 60.5 in | Wt 160.0 lb

## 2011-04-07 DIAGNOSIS — R5381 Other malaise: Secondary | ICD-10-CM

## 2011-04-07 DIAGNOSIS — R7301 Impaired fasting glucose: Secondary | ICD-10-CM

## 2011-04-07 DIAGNOSIS — F419 Anxiety disorder, unspecified: Secondary | ICD-10-CM

## 2011-04-07 DIAGNOSIS — F411 Generalized anxiety disorder: Secondary | ICD-10-CM

## 2011-04-07 DIAGNOSIS — F3289 Other specified depressive episodes: Secondary | ICD-10-CM

## 2011-04-07 DIAGNOSIS — F32A Depression, unspecified: Secondary | ICD-10-CM

## 2011-04-07 DIAGNOSIS — E785 Hyperlipidemia, unspecified: Secondary | ICD-10-CM

## 2011-04-07 DIAGNOSIS — F324 Major depressive disorder, single episode, in partial remission: Secondary | ICD-10-CM | POA: Insufficient documentation

## 2011-04-07 DIAGNOSIS — F418 Other specified anxiety disorders: Secondary | ICD-10-CM | POA: Insufficient documentation

## 2011-04-07 DIAGNOSIS — F329 Major depressive disorder, single episode, unspecified: Secondary | ICD-10-CM

## 2011-04-07 DIAGNOSIS — G4700 Insomnia, unspecified: Secondary | ICD-10-CM

## 2011-04-07 DIAGNOSIS — E669 Obesity, unspecified: Secondary | ICD-10-CM

## 2011-04-07 MED ORDER — SERTRALINE HCL 25 MG PO TABS: 25.0000 mg | ORAL_TABLET | Freq: Every day | ORAL | Status: DC

## 2011-04-07 MED ORDER — TEMAZEPAM 15 MG PO CAPS: 15.0000 mg | ORAL_CAPSULE | Freq: Every evening | ORAL | Status: AC | PRN

## 2011-04-07 MED ORDER — ALPRAZOLAM 0.25 MG PO TABS: ORAL_TABLET | ORAL | Status: DC

## 2011-04-07 NOTE — Patient Instructions (Addendum)
F/u in 2 months.  Fasting cbc, chem 7, lipid, hepatic, hBA1C, tsh asap.Past due.   Please start getting on the treadmill twice weekly with music, have fun, aim for 30 mins each time.  Continue tennis.  Resume restoril for improved sleep and zoloft for mood/energy.Continue aderall as before  Xanax will be sent in, use only if absolutely needed for sleep Hope you feel better soon, call if not doing any better in the next month

## 2011-04-14 ENCOUNTER — Other Ambulatory Visit: Payer: Self-pay

## 2011-04-14 MED ORDER — AMPHETAMINE-DEXTROAMPHET ER 15 MG PO CP24: 15.0000 mg | ORAL_CAPSULE | Freq: Every day | ORAL | Status: DC

## 2011-04-14 NOTE — Assessment & Plan Note (Signed)
Deteriorated. Patient re-educated about  the importance of commitment to a  minimum of 150 minutes of exercise per week. The importance of healthy food choices with portion control discussed. Encouraged to start a food diary, count calories and to consider  joining a support group. Sample diet sheets offered. Goals set by the patient for the next several months.    

## 2011-04-14 NOTE — Progress Notes (Signed)
  Subjective:    Patient ID: Carmen Mcintyre, female    DOB: 11/13/1965, 46 y.o.   MRN: [id number]  HPI The PT is here for follow up and re-evaluation of chronic medical conditions, medication management and review of any available recent lab and radiology data.  Preventive health is updated, specifically  Cancer screening and Immunization.   Questions or concerns regarding consultations or procedures which the PT has had in the interim are  addressed. The PT denies any adverse reactions to current medications since the last visit.  She reports decompensating in the last 4 months, not much energy, excessive fatigue, poor concentration, poor sleep, feeling overwhelmed, feels tearful though denies excesssive crying. No regular exercise      Review of Systems See HPI Denies recent fever or chills. Denies sinus pressure, nasal congestion, ear pain or sore throat. Denies chest congestion, productive cough or wheezing. Denies chest pains, palpitations and leg swelling Denies abdominal pain, nausea, vomiting,diarrhea or constipation.   Denies dysuria, frequency, hesitancy or incontinence. Denies joint pain, swelling and limitation in mobility. Denies headaches, seizures, numbness, or tingling. Denies skin break down or rash.        Objective:   Physical Exam  Patient alert and oriented and in no cardiopulmonary distress.  HEENT: No facial asymmetry, EOMI, no sinus tenderness,  oropharynx pink and moist.  Neck supple no adenopathy.  Chest: Clear to auscultation bilaterally.  CVS: S1, S2 no murmurs, no S3.  ABD: Soft non tender. Bowel sounds normal.  Ext: No edema  MS: Adequate ROM spine, shoulders, hips and knees.  Skin: Intact, no ulcerations or rash noted.  Psych: Good eye contact, normal affect. Memory intact not anxious mildly  depressed appearing.  CNS: CN 2-12 intact, power, tone and sensation normal throughout.       Assessment & Plan:

## 2011-04-14 NOTE — Assessment & Plan Note (Signed)
Updated lipids ae past due , pt to have this done asap

## 2011-04-14 NOTE — Assessment & Plan Note (Signed)
Deteriorated, though not overtly sad has multiple symptoms which provide a positive screen. 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

## 2011-04-14 NOTE — Assessment & Plan Note (Addendum)
Deteriorated, sleep hygiene discussed and encouraged, pt to resume restoril, xanax to be used only as needed, sparingly

## 2011-05-09 ENCOUNTER — Other Ambulatory Visit: Payer: Self-pay

## 2011-05-09 MED ORDER — AMPHETAMINE-DEXTROAMPHET ER 15 MG PO CP24: 15.0000 mg | ORAL_CAPSULE | Freq: Every day | ORAL | Status: DC

## 2011-06-05 ENCOUNTER — Ambulatory Visit: Payer: 59 | Admitting: Family Medicine

## 2011-06-16 ENCOUNTER — Encounter: Payer: Self-pay | Admitting: Family Medicine

## 2011-06-16 ENCOUNTER — Ambulatory Visit (INDEPENDENT_AMBULATORY_CARE_PROVIDER_SITE_OTHER): Payer: 59 | Admitting: Family Medicine

## 2011-06-16 VITALS — BP 114/78 | HR 55 | Resp 15 | Ht 60.5 in | Wt 156.1 lb

## 2011-06-16 DIAGNOSIS — F329 Major depressive disorder, single episode, unspecified: Secondary | ICD-10-CM

## 2011-06-16 DIAGNOSIS — E663 Overweight: Secondary | ICD-10-CM

## 2011-06-16 DIAGNOSIS — F32A Depression, unspecified: Secondary | ICD-10-CM

## 2011-06-16 DIAGNOSIS — J0120 Acute ethmoidal sinusitis, unspecified: Secondary | ICD-10-CM

## 2011-06-16 DIAGNOSIS — F3289 Other specified depressive episodes: Secondary | ICD-10-CM

## 2011-06-16 DIAGNOSIS — J45909 Unspecified asthma, uncomplicated: Secondary | ICD-10-CM

## 2011-06-16 DIAGNOSIS — Z6825 Body mass index (BMI) 25.0-25.9, adult: Secondary | ICD-10-CM

## 2011-06-16 MED ORDER — PREDNISONE (PAK) 5 MG PO TABS: 5.0000 mg | ORAL_TABLET | ORAL | Status: DC

## 2011-06-16 MED ORDER — PROMETHAZINE-DM 6.25-15 MG/5ML PO SYRP
ORAL_SOLUTION | ORAL | Status: AC
Start: 2011-06-16 — End: 2011-06-23

## 2011-06-16 MED ORDER — FLUCONAZOLE 150 MG PO TABS: ORAL_TABLET | ORAL | Status: AC

## 2011-06-16 MED ORDER — ALBUTEROL SULFATE HFA 108 (90 BASE) MCG/ACT IN AERS: 2.0000 | INHALATION_SPRAY | Freq: Four times a day (QID) | RESPIRATORY_TRACT | Status: DC | PRN

## 2011-06-16 MED ORDER — SULFAMETHOXAZOLE-TRIMETHOPRIM 800-160 MG PO TABS: 1.0000 | ORAL_TABLET | Freq: Two times a day (BID) | ORAL | Status: AC

## 2011-06-16 NOTE — Progress Notes (Signed)
  Subjective:    Patient ID: Carmen Mcintyre, female    DOB: September 05, 1965, 46 y.o.   MRN: [id number]  HPI  2 week h/o ethmoid sinus pressure with yellow post nasal drainage, cough with deep breathing and on lying down down, intermittent chills, no documented fever. Feels sick. Pt has been working on lifestyle change for improved health sporadically, anticipates more diligence in exercise as the weather changes. Mental health improved on current regime  Review of Systems See HPI Denies chest pains, palpitations and leg swelling Denies abdominal pain, nausea, vomiting,diarrhea or constipation.   Denies dysuria, frequency, hesitancy or incontinence. Denies joint pain, swelling and limitation in mobility. Denies headaches, seizures, numbness, or tingling. Denies depression, anxiety or insomnia. Denies skin break down or rash.        Objective:   Physical Exam  Patient alert and oriented and in no cardiopulmonary distress.Ill appearing  HEENT: No facial asymmetry, EOMI, ethmoidal  sinus tenderness,  oropharynx pink and moist.  Neck supple no adenopathy.  Chest: adequate air entry, scattered crackles, no wheezes CVS: S1, S2 no murmurs, no S3.  ABD: Soft non tender. Bowel sounds normal.  Ext: No edema  MS: Adequate ROM spine, shoulders, hips and knees.  Skin: Intact, no ulcerations or rash noted.  Psych: Good eye contact, normal affect. Memory intact not anxious or depressed appearing.  CNS: CN 2-12 intact, power, tone and sensation normal throughout.       Assessment & Plan:

## 2011-06-16 NOTE — Patient Instructions (Addendum)
F/u in 3 month  You are being treated for ethmoidal sinusitis and bronchitis.  Steroids, antibioitcs inhaler and cough suppressant are prescribed.  Call if no improvement in the next 2 weeks

## 2011-06-23 NOTE — Assessment & Plan Note (Signed)
Antibiotic prescribed 

## 2011-06-23 NOTE — Assessment & Plan Note (Signed)
Improved , continue current medication 

## 2011-06-23 NOTE — Assessment & Plan Note (Signed)
Steroid dose pack prescribed

## 2011-06-23 NOTE — Assessment & Plan Note (Signed)
Improved. Pt applauded on succesful weight loss through lifestyle change, and encouraged to continue same. Weight loss goal set for the next several months.  

## 2011-07-17 ENCOUNTER — Other Ambulatory Visit: Payer: Self-pay

## 2011-07-17 MED ORDER — AMPHETAMINE-DEXTROAMPHET ER 15 MG PO CP24: 15.0000 mg | ORAL_CAPSULE | Freq: Every day | ORAL | Status: DC

## 2011-09-15 ENCOUNTER — Encounter: Payer: Self-pay | Admitting: Family Medicine

## 2011-09-15 ENCOUNTER — Ambulatory Visit (INDEPENDENT_AMBULATORY_CARE_PROVIDER_SITE_OTHER): Payer: 59 | Admitting: Family Medicine

## 2011-09-15 VITALS — BP 150/94 | HR 91 | Resp 16 | Ht 60.5 in | Wt 150.1 lb

## 2011-09-15 DIAGNOSIS — F32A Depression, unspecified: Secondary | ICD-10-CM

## 2011-09-15 DIAGNOSIS — Z6825 Body mass index (BMI) 25.0-25.9, adult: Secondary | ICD-10-CM

## 2011-09-15 DIAGNOSIS — F3289 Other specified depressive episodes: Secondary | ICD-10-CM

## 2011-09-15 DIAGNOSIS — F329 Major depressive disorder, single episode, unspecified: Secondary | ICD-10-CM

## 2011-09-15 DIAGNOSIS — G9332 Myalgic encephalomyelitis/chronic fatigue syndrome: Secondary | ICD-10-CM

## 2011-09-15 DIAGNOSIS — R5382 Chronic fatigue, unspecified: Secondary | ICD-10-CM

## 2011-09-15 DIAGNOSIS — R7301 Impaired fasting glucose: Secondary | ICD-10-CM

## 2011-09-15 DIAGNOSIS — E663 Overweight: Secondary | ICD-10-CM

## 2011-09-15 MED ORDER — SERTRALINE HCL 50 MG PO TABS: 50.0000 mg | ORAL_TABLET | Freq: Every day | ORAL | Status: DC

## 2011-09-15 NOTE — Patient Instructions (Addendum)
F/u in 2 month, blood pressure slightly elevated today, I believe physical and emotional stress are the major factors  Dose increase on zoloft, as discussed   Fasting labs due , cBc, chem 7, lipid, hBA1C, tSH   It is important that you exercise regularly at least 30 minutes 5 times a week. If you develop chest pain, have severe difficulty breathing, or feel very tired, stop exercising immediately and seek medical attention    A healthy diet is rich in fruit, vegetables and whole grains. Poultry fish, nuts and beans are a healthy choice for protein rather then red meat. A low sodium diet and drinking 64 ounces of water daily is generally recommended. Oils and sweet should be limited. Carbohydrates especially for those who are diabetic or overweight, should be limited to 30-45 gram per meal. It is important to eat on a regular schedule, at least 3 times daily. Snacks should be primarily fruits, vegetables or nuts.

## 2011-09-25 ENCOUNTER — Other Ambulatory Visit: Payer: Self-pay | Admitting: Family Medicine

## 2011-10-01 NOTE — Assessment & Plan Note (Signed)
Improved. Pt applauded on succesful weight loss through lifestyle change, and encouraged to continue same. Weight loss goal set for the next several months.  

## 2011-10-01 NOTE — Progress Notes (Signed)
  Subjective:    Patient ID: Carmen Mcintyre, female    DOB: February 25, 1966, 46 y.o.   MRN: [id number]  HPI The PT is here for follow up and re-evaluation of chronic medical conditions, medication management and review of any available recent lab and radiology data.  Preventive health is updated, specifically  Cancer screening and Immunization.   Questions or concerns regarding consultations or procedures which the PT has had in the interim are  addressed. The PT denies any adverse reactions to current medications since the last visit.  Currently under increased stress due to illness of her stepfather in [place], she i travelling and spending 3 days per week continually for the last 6 to 8 weeks. Independently increased the dose of zoloft to help her cope. She has been comiting to exercise to some extent as well as  Healthy food choices and has had succesful weight loss      Review of Systems See HPI Denies recent fever or chills. Denies sinus pressure, nasal congestion, ear pain or sore throat. Denies chest congestion, productive cough or wheezing. Denies chest pains, palpitations and leg swelling Denies abdominal pain, nausea, vomiting,diarrhea or constipation.   Denies dysuria, frequency, hesitancy or incontinence. Denies joint pain, swelling and limitation in mobility. Denies headaches, seizures, numbness, or tingling. C/o increased stress, depression, anxiety and more difficulty sleeping depending on her day was Denies skin break down or rash.        Objective:   Physical Exam Patient alert and oriented and in no cardiopulmonary distress.  HEENT: No facial asymmetry, EOMI, no sinus tenderness,  oropharynx pink and moist.  Neck supple no adenopathy.  Chest: Clear to auscultation bilaterally.  CVS: S1, S2 no murmurs, no S3.  ABD: Soft non tender. Bowel sounds normal.  Ext: No edema  MS: Adequate ROM spine, shoulders, hips and knees.  Skin: Intact, no ulcerations or rash  noted.  Psych: Good eye contact, normal affect. Memory intact , tearful, anxious and depressed appearing.  CNS: CN 2-12 intact, power, tone and sensation normal throughout.        Assessment & Plan:

## 2011-10-01 NOTE — Assessment & Plan Note (Signed)
Good response to adderal continue same

## 2011-10-01 NOTE — Assessment & Plan Note (Signed)
Low carb diet encouraged updated labs needed

## 2011-10-01 NOTE — Assessment & Plan Note (Signed)
Increased stress with illness of her stepfather, dose increase on zoloft

## 2011-10-16 ENCOUNTER — Other Ambulatory Visit: Payer: Self-pay

## 2011-10-16 MED ORDER — AMPHETAMINE-DEXTROAMPHET ER 15 MG PO CP24: 15.0000 mg | ORAL_CAPSULE | Freq: Every day | ORAL | Status: DC

## 2011-11-13 ENCOUNTER — Encounter: Payer: Self-pay | Admitting: Family Medicine

## 2011-11-13 ENCOUNTER — Ambulatory Visit (INDEPENDENT_AMBULATORY_CARE_PROVIDER_SITE_OTHER): Payer: 59 | Admitting: Family Medicine

## 2011-11-13 VITALS — BP 116/68 | HR 86 | Resp 18 | Ht 60.5 in | Wt 150.1 lb

## 2011-11-13 DIAGNOSIS — R5382 Chronic fatigue, unspecified: Secondary | ICD-10-CM

## 2011-11-13 DIAGNOSIS — F3289 Other specified depressive episodes: Secondary | ICD-10-CM

## 2011-11-13 DIAGNOSIS — J302 Other seasonal allergic rhinitis: Secondary | ICD-10-CM

## 2011-11-13 DIAGNOSIS — E663 Overweight: Secondary | ICD-10-CM

## 2011-11-13 DIAGNOSIS — J309 Allergic rhinitis, unspecified: Secondary | ICD-10-CM

## 2011-11-13 DIAGNOSIS — G9332 Myalgic encephalomyelitis/chronic fatigue syndrome: Secondary | ICD-10-CM

## 2011-11-13 DIAGNOSIS — F329 Major depressive disorder, single episode, unspecified: Secondary | ICD-10-CM

## 2011-11-13 DIAGNOSIS — F32A Depression, unspecified: Secondary | ICD-10-CM

## 2011-11-13 DIAGNOSIS — Z6825 Body mass index (BMI) 25.0-25.9, adult: Secondary | ICD-10-CM

## 2011-11-13 DIAGNOSIS — G4700 Insomnia, unspecified: Secondary | ICD-10-CM

## 2011-11-13 NOTE — Patient Instructions (Addendum)
F/u in January.   It is important that you exercise regularly at least 30 minutes 5 times a week. If you develop chest pain, have severe difficulty breathing, or feel very tired, stop exercising immediately and seek medical attention   A healthy diet is rich in fruit, vegetables and whole grains. Poultry fish, nuts and beans are a healthy choice for protein rather then red meat. A low sodium diet and drinking 64 ounces of water daily is generally recommended. Oils and sweet should be limited. Carbohydrates especially for those who are diabetic or overweight, should be limited to 34-45 gram per meal. It is important to eat on a regular schedule, at least 3 times daily. Snacks should be primarily fruits, vegetables or nuts.   Weight loss goal of 5 to 8 pounds  Ensure you get pelvic exam and mammogram by december

## 2011-11-14 LAB — LIPID PANEL
Cholesterol: 149 mg/dL (ref 0–200)
HDL: 44 mg/dL (ref >39)
Triglycerides: 144 mg/dL (ref <150)

## 2011-11-14 LAB — CBC WITH DIFFERENTIAL/PLATELET
Basophils Absolute: 0 10*3/uL (ref 0.0–0.1)
Basophils Relative: 0 % (ref 0–1)
HCT: 40 % (ref 36.0–46.0)
MCHC: 34.3 g/dL (ref 30.0–36.0)
Monocytes Absolute: 0.4 10*3/uL (ref 0.1–1.0)
Neutro Abs: 3.2 10*3/uL (ref 1.7–7.7)
Platelets: 278 10*3/uL (ref 150–400)
RDW: 13.1 % (ref 11.5–15.5)
WBC: 5.9 10*3/uL (ref 4.0–10.5)

## 2011-11-14 LAB — HEPATIC FUNCTION PANEL
ALT: 16 U/L (ref 0–35)
Albumin: 3.9 g/dL (ref 3.5–5.2)
Alkaline Phosphatase: 72 U/L (ref 39–117)
Indirect Bilirubin: 0.3 mg/dL (ref 0.0–0.9)
Total Protein: 6.7 g/dL (ref 6.0–8.3)

## 2011-11-14 LAB — BASIC METABOLIC PANEL
BUN: 11 mg/dL (ref 6–23)
Creat: 0.71 mg/dL (ref 0.50–1.10)
Potassium: 4.2 mEq/L (ref 3.5–5.3)

## 2011-11-15 NOTE — Progress Notes (Signed)
  Subjective:    Patient ID: Carmen Mcintyre, female    DOB: [birth year], 46 y.o.   MRN: [id number]  HPI The PT is here for follow up and re-evaluation of chronic medical conditions, medication management and review of any available recent lab and radiology data.  Preventive health is updated, specifically  Cancer screening and Immunization.   Questions or concerns regarding consultations or procedures which the PT has had in the interim are  Addressed.Needs annual gyne exam, family h/o ovarian cancer in grandparent, will further address with gyne.Had question re genetic testing for breast cancer, however, no close family member, as in first degree relative with breast ca, therefore  No indicaion The PT denies any adverse reactions to current medications since the last visit.  There are no new concerns. Still has difficulty with sleep, very sound sensitive, states the crickets in a nearby pond disturb her, also snoring. I recommend ear plugs There are no specific complaints . Now looking for increased employment opportunity, children are growing up. Decreased zoloft dose , states felt zonked/spaced out. Since last visit, her stepfather has passed, Mom is doing fairly well Continues to work on healthy lifestyle, has had weight loss success since her last visit      Review of Systems See HPI Denies recent fever or chills. Denies sinus pressure, nasal congestion, ear pain or sore throat. Denies chest congestion, productive cough or wheezing. Denies chest pains, palpitations and leg swelling Denies abdominal pain, nausea, vomiting,diarrhea or constipation.   Denies dysuria, frequency, hesitancy or incontinence. Denies joint pain, swelling and limitation in mobility. Denies headaches, seizures, numbness, or tingling. Denies uncontrolled  depression, anxiety or insomnia. Denies skin break down or rash.        Objective:   Physical Exam  Patient alert and oriented and in no cardiopulmonary  distress.  HEENT: No facial asymmetry, EOMI, no sinus tenderness,  oropharynx pink and moist.  Neck supple no adenopathy.  Chest: Clear to auscultation bilaterally.  CVS: S1, S2 no murmurs, no S3.  ABD: Soft non tender. Bowel sounds normal.  Ext: No edema  MS: Adequate ROM spine, shoulders, hips and knees.  Skin: Intact, no ulcerations or rash noted.  Psych: Good eye contact, normal affect. Memory intact not anxious or depressed appearing.  CNS: CN 2-12 intact, power, tone and sensation normal throughout.       Assessment & Plan:

## 2011-11-15 NOTE — Assessment & Plan Note (Signed)
Controlled, no change in medication  

## 2011-11-15 NOTE — Assessment & Plan Note (Signed)
Pt to ensure no noise in background while sleeping, she is encouraged to use earplugs

## 2011-11-15 NOTE — Assessment & Plan Note (Signed)
Improved and controlled on lower dose of zoloft.Hopefully employment opportunities will also present themselves, this would help

## 2011-11-15 NOTE — Assessment & Plan Note (Signed)
Improved. Pt applauded on succesful weight loss through lifestyle change, and encouraged to continue same. Weight loss goal set for the next several months.  

## 2011-11-15 NOTE — Assessment & Plan Note (Signed)
Pt encouraged to continue daily exercise, increase to 1 hour daily

## 2012-01-12 ENCOUNTER — Telehealth: Payer: Self-pay | Admitting: Family Medicine

## 2012-01-13 ENCOUNTER — Other Ambulatory Visit: Payer: Self-pay

## 2012-01-13 MED ORDER — AMPHETAMINE-DEXTROAMPHET ER 15 MG PO CP24: 15.0000 mg | ORAL_CAPSULE | Freq: Every day | ORAL | Status: DC

## 2012-01-13 NOTE — Telephone Encounter (Signed)
Pt aware.

## 2012-03-17 ENCOUNTER — Ambulatory Visit: Payer: 59 | Admitting: Family Medicine

## 2012-04-13 ENCOUNTER — Other Ambulatory Visit: Payer: Self-pay

## 2012-04-13 MED ORDER — AMPHETAMINE-DEXTROAMPHET ER 15 MG PO CP24: 15.0000 mg | ORAL_CAPSULE | Freq: Every day | ORAL | Status: DC

## 2012-04-14 ENCOUNTER — Other Ambulatory Visit: Payer: Self-pay | Admitting: Family Medicine

## 2012-04-28 ENCOUNTER — Other Ambulatory Visit (HOSPITAL_COMMUNITY)
Admission: RE | Admit: 2012-04-28 | Discharge: 2012-04-28 | Disposition: A | Discharge status: Home / Self Care | Payer: 59 | Source: Ambulatory Visit | Attending: Obstetrics and Gynecology | Admitting: Obstetrics and Gynecology

## 2012-04-28 ENCOUNTER — Other Ambulatory Visit: Payer: Self-pay | Admitting: Obstetrics and Gynecology

## 2012-04-28 DIAGNOSIS — Z01419 Encounter for gynecological examination (general) (routine) without abnormal findings: Secondary | ICD-10-CM | POA: Insufficient documentation

## 2012-04-28 DIAGNOSIS — Z1151 Encounter for screening for human papillomavirus (HPV): Secondary | ICD-10-CM | POA: Insufficient documentation

## 2012-07-13 ENCOUNTER — Other Ambulatory Visit: Payer: Self-pay

## 2012-07-13 MED ORDER — AMPHETAMINE-DEXTROAMPHET ER 15 MG PO CP24: 15.0000 mg | ORAL_CAPSULE | Freq: Every day | ORAL | Status: DC

## 2012-08-25 ENCOUNTER — Other Ambulatory Visit: Payer: Self-pay

## 2012-08-25 MED ORDER — ALPRAZOLAM 0.25 MG PO TABS: ORAL_TABLET | ORAL | Status: DC

## 2012-09-27 ENCOUNTER — Telehealth: Payer: Self-pay | Admitting: Family Medicine

## 2012-09-28 NOTE — Telephone Encounter (Signed)
I contacted pt, she needs routine f/u in next 2 weeks. No labs needed before that visit. Pls sched and let her know (Late entry in computer, we discussed already, so if already done just sig off, thanks

## 2012-10-04 ENCOUNTER — Encounter: Payer: Self-pay | Admitting: Family Medicine

## 2012-10-04 ENCOUNTER — Ambulatory Visit (INDEPENDENT_AMBULATORY_CARE_PROVIDER_SITE_OTHER): Payer: 59 | Admitting: Family Medicine

## 2012-10-04 VITALS — BP 138/84 | HR 95 | Resp 16 | Ht 60.5 in | Wt 163.0 lb

## 2012-10-04 DIAGNOSIS — R5383 Other fatigue: Secondary | ICD-10-CM

## 2012-10-04 DIAGNOSIS — Z139 Encounter for screening, unspecified: Secondary | ICD-10-CM

## 2012-10-04 DIAGNOSIS — F32A Depression, unspecified: Secondary | ICD-10-CM

## 2012-10-04 DIAGNOSIS — Z6825 Body mass index (BMI) 25.0-25.9, adult: Secondary | ICD-10-CM

## 2012-10-04 DIAGNOSIS — G4700 Insomnia, unspecified: Secondary | ICD-10-CM

## 2012-10-04 DIAGNOSIS — E785 Hyperlipidemia, unspecified: Secondary | ICD-10-CM

## 2012-10-04 DIAGNOSIS — R5381 Other malaise: Secondary | ICD-10-CM

## 2012-10-04 DIAGNOSIS — E663 Overweight: Secondary | ICD-10-CM

## 2012-10-04 DIAGNOSIS — R5382 Chronic fatigue, unspecified: Secondary | ICD-10-CM

## 2012-10-04 DIAGNOSIS — F329 Major depressive disorder, single episode, unspecified: Secondary | ICD-10-CM

## 2012-10-04 DIAGNOSIS — F3289 Other specified depressive episodes: Secondary | ICD-10-CM

## 2012-10-04 DIAGNOSIS — G9332 Myalgic encephalomyelitis/chronic fatigue syndrome: Secondary | ICD-10-CM

## 2012-10-04 DIAGNOSIS — R7301 Impaired fasting glucose: Secondary | ICD-10-CM

## 2012-10-04 DIAGNOSIS — Z23 Encounter for immunization: Secondary | ICD-10-CM

## 2012-10-04 NOTE — Progress Notes (Signed)
  Subjective:    Patient ID: Carmen Mcintyre, female    DOB: 1965-05-13, 47 y.o.   MRN: [id number]  HPI  Excessive chronic fatigue which has worsened considerably in the last 4 month, actually recently doubled her dose of aderalwhen she had company. States that early afternoon totally whooped  Inconsistent exercise,, approx 3 times per week, overeating, and not much energiy to go out  Mom has CAd over age 56  Recently had head congestion attributed to uncontrolled allergies responded to flonase, no associated fever , chills , cough or green drainage, states she feels like she did when she had to have her lobectomy   Review of Systems See HPI Denies recent fever or chills. Denies sinus pressure, nasal congestion, ear pain or sore throat. Denies chest congestion, productive cough or wheezing.  Denies abdominal pain, nausea, vomiting,diarrhea or constipation.   Denies dysuria, frequency, hesitancy or incontinence. Denies joint pain, swelling and limitation in mobility. Denies headaches, seizures, numbness, or tingling. Denies depression, anxiety or insomnia. Denies skin break down or rash.        Objective:   Physical Exam  Patient alert and oriented and in no cardiopulmonary distress.  HEENT: No facial asymmetry, EOMI, no sinus tenderness,  oropharynx pink and moist.  Neck supple no adenopathy.  Chest: Clear to auscultation bilaterally.  CVS: S1, S2 no murmurs, no S3.  ABD: Soft non tender. Bowel sounds normal.  Ext: No edema  MS: Adequate ROM spine, shoulders, hips and knees.  Skin: Intact, no ulcerations or rash noted.  Psych: Good eye contact, normal affect. Memory intact not anxious or depressed appearing.  CNS: CN 2-12 intact, power, tone and sensation normal throughout.       Assessment & Plan:

## 2012-10-04 NOTE — Patient Instructions (Addendum)
F/u in mid to end September. Call if you need me before   CXR today. Fasting lipid, cmp, TSh, CBc, HBA1C, and vit D asap   You are  Referred for cardiology eval in [facility].  EKG today   No changes in medication at this time  I am SO HAPPY  THAT I CALLED YOU!  TDaP today  Please commit to walking every evening, remember you felt much better and weight less when you were training with your son  Ask your husband to become a part of your evening exercise program even 3 of the 5 work days initially, good for both of you as far as physical and mental health are concerned  Please try to increase vegetable and fruit intake and cut back on carbs sweet and fatty foods  After cardiology has evaluated you , we will reconsider addition of an appetite suppressant if you still need some help with weight loss

## 2012-10-06 ENCOUNTER — Encounter: Payer: Self-pay | Admitting: Cardiology

## 2012-10-06 ENCOUNTER — Ambulatory Visit (INDEPENDENT_AMBULATORY_CARE_PROVIDER_SITE_OTHER): Payer: 59 | Admitting: Cardiology

## 2012-10-06 ENCOUNTER — Ambulatory Visit: Payer: 59 | Admitting: Cardiology

## 2012-10-06 ENCOUNTER — Ambulatory Visit (HOSPITAL_COMMUNITY)
Admission: RE | Admit: 2012-10-06 | Discharge: 2012-10-06 | Disposition: A | Discharge status: Home / Self Care | Payer: 59 | Source: Ambulatory Visit | Attending: Family Medicine | Admitting: Family Medicine

## 2012-10-06 VITALS — BP 142/92 | HR 106 | Ht 67.0 in | Wt 162.0 lb

## 2012-10-06 DIAGNOSIS — R5383 Other fatigue: Secondary | ICD-10-CM

## 2012-10-06 DIAGNOSIS — R000 Tachycardia, unspecified: Secondary | ICD-10-CM

## 2012-10-06 DIAGNOSIS — I498 Other specified cardiac arrhythmias: Secondary | ICD-10-CM

## 2012-10-06 DIAGNOSIS — R5381 Other malaise: Secondary | ICD-10-CM

## 2012-10-06 DIAGNOSIS — R0602 Shortness of breath: Secondary | ICD-10-CM | POA: Insufficient documentation

## 2012-10-06 LAB — CBC WITH DIFFERENTIAL/PLATELET
Hemoglobin: 13.5 g/dL (ref 12.0–15.0)
Lymphocytes Relative: 37 % (ref 12–46)
Lymphs Abs: 2 10*3/uL (ref 0.7–4.0)
Monocytes Relative: 9 % (ref 3–12)
Neutro Abs: 2.9 10*3/uL (ref 1.7–7.7)
Neutrophils Relative %: 52 % (ref 43–77)
RBC: 4.29 MIL/uL (ref 3.87–5.11)

## 2012-10-06 LAB — COMPREHENSIVE METABOLIC PANEL
BUN: 12 mg/dL (ref 6–23)
CO2: 24 mEq/L (ref 19–32)
Calcium: 8.9 mg/dL (ref 8.4–10.5)
Chloride: 103 mEq/L (ref 96–112)
Creat: 0.8 mg/dL (ref 0.50–1.10)

## 2012-10-06 LAB — HEMOGLOBIN A1C
Hgb A1c MFr Bld: 5.3 % (ref <5.7)
Mean Plasma Glucose: 105 mg/dL (ref <117)

## 2012-10-06 LAB — LIPID PANEL
Cholesterol: 167 mg/dL (ref 0–200)
HDL: 34 mg/dL — ABNORMAL LOW (ref >39)
Triglycerides: 159 mg/dL — ABNORMAL HIGH (ref <150)
VLDL: 32 mg/dL (ref 0–40)

## 2012-10-06 LAB — TSH: TSH: 1.317 u[IU]/mL (ref 0.350–4.500)

## 2012-10-06 NOTE — Assessment & Plan Note (Signed)
I do not see any cardiac etiology for her fatigue or malaise. Her history and exam are unremarkable. She does have sinus tachycardia but she says she's always runs fast even before taking Adderall. She needs to followup on her blood work particularly her A1c, hemoglobin, and TSH. If these are normal, I would recommend daily exercise and increasing her Adderall dose per Dr. Lodema Hong. She needs to lose 15 pounds which make her feel better as well. I will see her back when necessary.

## 2012-10-06 NOTE — Patient Instructions (Addendum)
Your physician recommends that you schedule a follow-up appointment in: as needed  

## 2012-10-06 NOTE — Progress Notes (Signed)
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 Lodema Hong for the evaluation of fatigue.  She has a history of chronic fatigue syndrome. She is treated with Adderall. However, her symptoms of fatigue have been worse recently.  She generally sleeps well. She is he feels rested in the morning. By midafternoon  she feels like she hits a [place]. She has to lay down and rest.  She denies any dyspnea on exertion, orthopnea, edema, PND. She's had no tachycardia palpitations. Her heart rates 100 beats per minute today in sinus tach. She says she's always run high he before she started taking Adderall.  Her exercise tolerance is the same. She enjoys walking. She has gained about 15 pounds over the past 6 months two-year period she had blood work done today which is pending. 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 There is no cardiomegaly. TSH was checked today but has been normal in the past. Her lipids are unremarkable. Hemoglobin A1c has been below 6% of the last several years.  Past Medical History  Diagnosis Date  . Urinary incontinence   . Anxiety disorder   . Insomnia   . Dyslipidemia     Current Outpatient Prescriptions  Medication Sig Dispense Refill  . ALPRAZolam (XANAX) 0.25 MG tablet TAKE 1 TABLET BY MOUTH AT BEDTIME AS NEEDED FOR SLEEP, NOT TO EXCEED 3 TABLETS WEEKLY  90 tablet  0  . amphetamine-dextroamphetamine (ADDERALL XR) 15 MG 24 hr capsule Take 1 capsule (15 mg total) by mouth daily.  90 capsule  0  . cetirizine (ZYRTEC HIVES RELIEF) 10 MG tablet Take 10 mg by mouth daily.        Marland Kitchen estrogen-methyltestosterone (EST ESTROGENS-METHYLTEST HS) 0.625-1.25 MG per tablet Take 1 tablet by mouth daily.        . sertraline (ZOLOFT) 25 MG tablet Take 1 tablet (25 mg total) by mouth daily.  90 tablet  2   No current facility-administered medications for this visit.    Allergies  Allergen  Reactions  . Hydrocodone     nausea    Family History  Problem Relation Age of Onset  . Diabetes Mother   . Arthritis      History   Social History  . Marital Status: Married    Spouse Name: N/A    Number of Children: 2  . Years of Education: N/A   Occupational History  . Employed     Social History Main Topics  . Smoking status: Never Smoker   . Smokeless tobacco: Not on file  . Alcohol Use: Yes     Comment: occ.  . Drug Use: No  . Sexually Active: Not on file   Other Topics Concern  . Not on file   Social History Narrative  . No narrative on file    ROS ALL NEGATIVE EXCEPT THOSE NOTED IN HPI  PE  General Appearance: well developed, well nourished in no acute distress, overweight HEENT: symmetrical face, PERRLA, good dentition  Neck: no JVD, thyromegaly, or adenopathy, trachea midline Chest: symmetric without deformity Cardiac: PMI non-displaced, RRR, normal S1, S2, no gallop or murmur Lung: clear to ausculation and percussion Vascular: all pulses full without bruits  Abdominal: nondistended, nontender, good bowel sounds, no HSM, no bruits Extremities: no cyanosis, clubbing or edema, no sign of DVT, no varicosities  Skin: normal color, no rashes Neuro: alert and oriented x 3, non-focal Pysch: normal affect  EKG Sinus tachycardia, otherwise unremarkable. BMET    Component Value Date/Time   NA 137 11/13/2011 0847   K 4.2 11/13/2011 0847   CL 102 11/13/2011 0847   CO2 29 11/13/2011 0847   GLUCOSE 93 11/13/2011 0847   BUN 11 11/13/2011 0847   CREATININE 0.71 11/13/2011 0847   CREATININE 0.73 08/15/2009 1952   CALCIUM 9.0 11/13/2011 0847   GFRNONAA >60 05/30/2007 0840   GFRAA  Value: >60        The eGFR has been calculated using the MDRD equation. This calculation has not been validated in all clinical 05/30/2007 0840    Lipid Panel     Component Value Date/Time   CHOL 149 11/13/2011 0847   TRIG 144 11/13/2011 0847   HDL 44 11/13/2011 0847   CHOLHDL 3.4 11/13/2011 0847    VLDL 29 11/13/2011 0847   LDLCALC 76 11/13/2011 0847    CBC    Component Value Date/Time   WBC 5.9 11/13/2011 0847   RBC 4.35 11/13/2011 0847   HGB 13.7 11/13/2011 0847   HCT 40.0 11/13/2011 0847   PLT 278 11/13/2011 0847   MCV 92.0 11/13/2011 0847   MCH 31.5 11/13/2011 0847   MCHC 34.3 11/13/2011 0847   RDW 13.1 11/13/2011 0847   LYMPHSABS 2.2 11/13/2011 0847   MONOABS 0.4 11/13/2011 0847   EOSABS 0.1 11/13/2011 0847   BASOSABS 0.0 11/13/2011 0847

## 2012-10-08 ENCOUNTER — Other Ambulatory Visit: Payer: Self-pay | Admitting: Family Medicine

## 2012-10-08 ENCOUNTER — Telehealth: Payer: Self-pay

## 2012-10-08 ENCOUNTER — Other Ambulatory Visit: Payer: Self-pay

## 2012-10-08 MED ORDER — AMPHETAMINE-DEXTROAMPHET ER 15 MG PO CP24: 15.0000 mg | ORAL_CAPSULE | Freq: Every day | ORAL | Status: DC

## 2012-10-08 MED ORDER — AMPHETAMINE-DEXTROAMPHETAMINE 20 MG PO TABS: 20.0000 mg | ORAL_TABLET | Freq: Every day | ORAL | Status: DC

## 2012-10-08 NOTE — Telephone Encounter (Signed)
Dose increase effective 10/08/2012.Pt aware and she will collect script this pm

## 2012-10-10 NOTE — Assessment & Plan Note (Signed)
deteriorated Dietary modification and regular exercise with weight loss needed

## 2012-10-10 NOTE — Assessment & Plan Note (Signed)
Worsening symptoms in the setting of sporadic exercise and weight gain. Needs lifestyle change and will solicit support from her spouse Will increase adderal dose after cardiology clearance

## 2012-10-10 NOTE — Assessment & Plan Note (Signed)
Deterioration in symptoms, with f/h of CAD Office EKG normal, will still have cardiology input. Updated labs needed rept CXr also

## 2012-10-10 NOTE — Assessment & Plan Note (Signed)
Deteriorated. Patient re-educated about  the importance of commitment to a  minimum of 150 minutes of exercise per week. The importance of healthy food choices with portion control discussed. Encouraged to start a food diary, count calories and to consider  joining a support group. Sample diet sheets offered. Goals set by the patient for the next several months.    

## 2012-10-10 NOTE — Assessment & Plan Note (Signed)
Takes zoloft sporadically only , will consider discontinuing again though when this was done last year she had to go back on the medication I suspect that she may benefit more from daily med adherence will re address at next visit

## 2012-10-10 NOTE — Assessment & Plan Note (Signed)
Improved, since no longer working

## 2012-10-20 ENCOUNTER — Encounter: Payer: Self-pay | Admitting: Family Medicine

## 2012-11-02 ENCOUNTER — Other Ambulatory Visit: Payer: Self-pay

## 2012-11-02 DIAGNOSIS — F32A Depression, unspecified: Secondary | ICD-10-CM

## 2012-11-02 DIAGNOSIS — F329 Major depressive disorder, single episode, unspecified: Secondary | ICD-10-CM

## 2012-11-02 MED ORDER — SERTRALINE HCL 25 MG PO TABS: 25.0000 mg | ORAL_TABLET | Freq: Every day | ORAL | Status: DC

## 2012-11-22 ENCOUNTER — Other Ambulatory Visit: Payer: Self-pay | Admitting: Family Medicine

## 2012-11-22 ENCOUNTER — Ambulatory Visit (INDEPENDENT_AMBULATORY_CARE_PROVIDER_SITE_OTHER): Payer: 59 | Admitting: Family Medicine

## 2012-11-22 ENCOUNTER — Encounter: Payer: Self-pay | Admitting: Family Medicine

## 2012-11-22 VITALS — BP 126/78 | HR 96 | Resp 18 | Ht 60.5 in | Wt 164.1 lb

## 2012-11-22 DIAGNOSIS — Z23 Encounter for immunization: Secondary | ICD-10-CM

## 2012-11-22 DIAGNOSIS — Z139 Encounter for screening, unspecified: Secondary | ICD-10-CM

## 2012-11-22 DIAGNOSIS — N951 Menopausal and female climacteric states: Secondary | ICD-10-CM

## 2012-11-22 DIAGNOSIS — F32A Depression, unspecified: Secondary | ICD-10-CM

## 2012-11-22 DIAGNOSIS — F329 Major depressive disorder, single episode, unspecified: Secondary | ICD-10-CM

## 2012-11-22 DIAGNOSIS — R232 Flushing: Secondary | ICD-10-CM

## 2012-11-22 DIAGNOSIS — F3289 Other specified depressive episodes: Secondary | ICD-10-CM

## 2012-11-22 DIAGNOSIS — E663 Overweight: Secondary | ICD-10-CM

## 2012-11-22 DIAGNOSIS — Z6825 Body mass index (BMI) 25.0-25.9, adult: Secondary | ICD-10-CM

## 2012-11-22 DIAGNOSIS — G9332 Myalgic encephalomyelitis/chronic fatigue syndrome: Secondary | ICD-10-CM

## 2012-11-22 DIAGNOSIS — E785 Hyperlipidemia, unspecified: Secondary | ICD-10-CM

## 2012-11-22 DIAGNOSIS — R5382 Chronic fatigue, unspecified: Secondary | ICD-10-CM

## 2012-11-22 MED ORDER — PHENTERMINE HCL 37.5 MG PO TABS: ORAL_TABLET | ORAL | Status: DC

## 2012-11-22 MED ORDER — VENLAFAXINE HCL ER 37.5 MG PO CP24: 37.5000 mg | ORAL_CAPSULE | Freq: Every day | ORAL | Status: DC

## 2012-11-22 NOTE — Progress Notes (Signed)
  Subjective:    Patient ID: Carmen Mcintyre, female    DOB: 13-May-1965, 47 y.o.   MRN: [id number]  HPI The PT is here for follow up and re-evaluation of chronic medical conditions, medication management and review of any available recent lab and radiology data.  Preventive health is updated, specifically  Cancer screening and Immunization.   Questions or concerns regarding consultations or procedures which the PT has had in the interim are  Addressed.Has been evaluated byy cardiology, no pathology identified to contribute to excessive fatigue, advised by cardiology to adopt a lifestyle of regular exercise with weight loss to health weight  The PT denies any adverse reactions to current medications since the last visit.  C/o worsening hot flashes , disturbing sleep and contributing to fatigue and an overall feeling of malaise. Has started more regular exercise with her spouse, this has helped     Review of Systems See HPI Denies recent fever or chills. Denies sinus pressure, nasal congestion, ear pain or sore throat. Denies chest congestion, productive cough or wheezing. Denies chest pains, palpitations and leg swelling Denies abdominal pain, nausea, vomiting,diarrhea or constipation.   Denies dysuria, frequency, hesitancy or incontinence. Denies joint pain, swelling and limitation in mobility. Denies headaches, seizures, numbness, or tingling. Denies depression, anxiety or insomnia. Denies skin break down or rash.        Objective:   Physical Exam  Patient alert and oriented and in no cardiopulmonary distress.  HEENT: No facial asymmetry, EOMI, no sinus tenderness,  oropharynx pink and moist.  Neck supple no adenopathy.  Chest: Clear to auscultation bilaterally.  CVS: S1, S2 no murmurs, no S3.  ABD: Soft non tender. Bowel sounds normal.  Ext: No edema  MS: Adequate ROM spine, shoulders, hips and knees.  Skin: Intact, no ulcerations or rash noted.  Psych: Good eye  contact, normal affect. Memory intact not anxious or depressed appearing.  CNS: CN 2-12 intact, power, tone and sensation normal throughout.       Assessment & Plan:

## 2012-11-22 NOTE — Patient Instructions (Signed)
F/u in 3 month, call if you need me before   New for hot flashes , is effexor, discontinue zoloft as soon as you start the effexor please.  We will also provide information on hot flashes  New to help with weight loss is phentermine HALF daily, call if you develop any adverse side efffects as in palpitations or shortness of breath, and stop the medicine. This will also help with energy.  Please continue to have open discussions amongst your family and also with parents in similar situations regarding college safety issues, so that by next Fall, everyone is ready to take the plunge as far as whatever decision is made in your situation  Pls send me a message as far as effectiveness of medication changes, espescially if an issue  Weight loss goal of 2.5 pounds per month

## 2012-11-23 ENCOUNTER — Ambulatory Visit (HOSPITAL_COMMUNITY)
Admission: RE | Admit: 2012-11-23 | Discharge: 2012-11-23 | Disposition: A | Discharge status: Home / Self Care | Payer: 59 | Source: Ambulatory Visit | Attending: Family Medicine | Admitting: Family Medicine

## 2012-11-23 DIAGNOSIS — Z139 Encounter for screening, unspecified: Secondary | ICD-10-CM

## 2012-11-23 DIAGNOSIS — Z1231 Encounter for screening mammogram for malignant neoplasm of breast: Secondary | ICD-10-CM | POA: Insufficient documentation

## 2013-01-03 ENCOUNTER — Telehealth: Payer: Self-pay | Admitting: Adult Health

## 2013-01-03 DIAGNOSIS — R232 Flushing: Secondary | ICD-10-CM | POA: Insufficient documentation

## 2013-01-03 NOTE — Assessment & Plan Note (Signed)
Change to effexpor, as increased hot flashes, she has done well on effexor in the past

## 2013-01-03 NOTE — Assessment & Plan Note (Signed)
Worsened, behavioral modification discussed. Start effexor

## 2013-01-03 NOTE — Assessment & Plan Note (Signed)
Deteriorated. Patient re-educated about  the importance of commitment to a  minimum of 150 minutes of exercise per week. The importance of healthy food choices with portion control discussed. Encouraged to start a food diary, count calories and to consider  joining a support group. Sample diet sheets offered. Goals set by the patient for the next several months.   Start phentermine half daily 

## 2013-01-03 NOTE — Telephone Encounter (Signed)
Pt requesting Dr. Emelda Fear to order breast ultrasound due to fibroglandular tissue noted on last mammogram and after researching online. Pt informed recommendation was mammogram in 1 year, however pt would still like to have breast u/s done.

## 2013-01-03 NOTE — Assessment & Plan Note (Signed)
Hyperlipidemia:Low fat diet discussed and encouraged.  No meds indicated currently

## 2013-01-03 NOTE — Assessment & Plan Note (Signed)
Continue current dose aderall Pt encouraged to develop regular exercise routine, and to get sufficient rest

## 2013-01-04 ENCOUNTER — Other Ambulatory Visit: Payer: Self-pay

## 2013-01-04 MED ORDER — AMPHETAMINE-DEXTROAMPHETAMINE 20 MG PO TABS: 20.0000 mg | ORAL_TABLET | Freq: Every day | ORAL | Status: DC

## 2013-01-05 NOTE — Telephone Encounter (Signed)
Patient has read on internet about fibroglandular tissue ,, and is interested in u/s. Mammogram  Report reviewed , no suspicious areas noted.  Lengthy discussion of br cancer risk factors.  Pt fam history negative for first degree relatives with br ca, or premenopausal cancers.  Fam hx pos for distant hx of grandmother's sister with br ca, not thought to be premenopausal.(3rd degree relative). Pt risk felt normal for age. Pt advised to discuss with insurance, as it may be uncovered service, given absence of risk factors. If pt desires u/s even if uncovered, will arrange, pt to make appt after discussion and further info obtained.

## 2013-02-17 ENCOUNTER — Ambulatory Visit: Payer: 59 | Admitting: Family Medicine

## 2013-02-21 ENCOUNTER — Telehealth: Payer: Self-pay | Admitting: Obstetrics and Gynecology

## 2013-02-21 NOTE — Telephone Encounter (Signed)
See my last note from 10/14, please call pt , offer appt.

## 2013-02-23 ENCOUNTER — Encounter: Payer: Self-pay | Admitting: Family Medicine

## 2013-02-23 ENCOUNTER — Telehealth: Payer: Self-pay | Admitting: Obstetrics and Gynecology

## 2013-02-23 ENCOUNTER — Ambulatory Visit (INDEPENDENT_AMBULATORY_CARE_PROVIDER_SITE_OTHER): Payer: 59 | Admitting: Family Medicine

## 2013-02-23 VITALS — BP 120/70 | HR 100 | Resp 18 | Ht 60.5 in | Wt 163.1 lb

## 2013-02-23 DIAGNOSIS — E669 Obesity, unspecified: Secondary | ICD-10-CM

## 2013-02-23 DIAGNOSIS — E66811 Obesity, class 1: Secondary | ICD-10-CM

## 2013-02-23 DIAGNOSIS — J302 Other seasonal allergic rhinitis: Secondary | ICD-10-CM

## 2013-02-23 DIAGNOSIS — R7301 Impaired fasting glucose: Secondary | ICD-10-CM

## 2013-02-23 DIAGNOSIS — R232 Flushing: Secondary | ICD-10-CM

## 2013-02-23 DIAGNOSIS — J309 Allergic rhinitis, unspecified: Secondary | ICD-10-CM

## 2013-02-23 DIAGNOSIS — N951 Menopausal and female climacteric states: Secondary | ICD-10-CM

## 2013-02-23 DIAGNOSIS — E785 Hyperlipidemia, unspecified: Secondary | ICD-10-CM

## 2013-02-23 MED ORDER — PHENTERMINE HCL 37.5 MG PO TABS: 37.5000 mg | ORAL_TABLET | Freq: Every day | ORAL | Status: DC

## 2013-02-23 NOTE — Assessment & Plan Note (Signed)
  Unchnaged Patient re-educated about  the importance of commitment to a  minimum of 150 minutes of exercise per week. The importance of healthy food choices with portion control discussed. Encouraged to start a food diary, count calories and to consider  joining a support group. Sample diet sheets offered. Goals set by the patient for the next several months. Needs to change to daily phentermine and commit to daly exercise

## 2013-02-23 NOTE — Patient Instructions (Addendum)
F/u in 4 month  Call if you need me before    Weight loss goal of 2 pounds per month  Plan on at least 20 minutes of exercise every day  Continue phentermine one daily,you need to take this every day   HBA1C in 4 month

## 2013-02-23 NOTE — Assessment & Plan Note (Addendum)
Hyperlipidemia:Low fat diet discussed and encouraged.  Updated  lab next Summer

## 2013-02-23 NOTE — Assessment & Plan Note (Signed)
Pt states she has responded well to the effexor and will continue on this

## 2013-02-23 NOTE — Progress Notes (Signed)
   Subjective:    Patient ID: Carmen Mcintyre, female    DOB: 1965-10-07, 47 y.o.   MRN: [id number]  HPI The PT is here for follow up and re-evaluation of chronic medical conditions, medication management and review of any available recent lab and radiology data.  Preventive health is updated, specifically  Cancer screening and Immunization.   Questions or concerns regarding consultations or procedures which the PT has had in the interim are  addressed. The PT denies any adverse reactions to current medications since the last visit.  There are no new concerns.  There are no specific complaints       Review of Systems See HPI Denies recent fever or chills. Denies sinus pressure, nasal congestion, ear pain or sore throat. Denies chest congestion, productive cough or wheezing. Denies chest pains, palpitations and leg swelling Denies abdominal pain, nausea, vomiting,diarrhea or constipation.   Denies dysuria, frequency, hesitancy or incontinence. Denies joint pain, swelling and limitation in mobility. Denies headaches, seizures, numbness, or tingling. Denies depression, anxiety or insomnia. Denies skin break down or rash.        Objective:   Physical Exam  Patient alert and oriented and in no cardiopulmonary distress.  HEENT: No facial asymmetry, EOMI, no sinus tenderness,  oropharynx pink and moist.  Neck supple no adenopathy.  Chest: Clear to auscultation bilaterally.  CVS: S1, S2 no murmurs, no S3.  ABD: Soft non tender. Bowel sounds normal.  Ext: No edema  MS: Adequate ROM spine, shoulders, hips and knees.  Skin: Intact, no ulcerations or rash noted.  Psych: Good eye contact, normal affect. Memory intact not anxious or depressed appearing.  CNS: CN 2-12 intact, power, tone and sensation normal throughout.       Assessment & Plan:

## 2013-02-23 NOTE — Assessment & Plan Note (Signed)
Controlled, no change in medication Using oTC meds only

## 2013-03-07 NOTE — Telephone Encounter (Signed)
Pt call was transferred to front staff for an appt to be made with Dr. Emelda Fear to discuss us/mammogram. Per Dr. Emelda Fear note.

## 2013-03-09 ENCOUNTER — Other Ambulatory Visit: Payer: Self-pay | Admitting: Family Medicine

## 2013-03-15 ENCOUNTER — Ambulatory Visit (INDEPENDENT_AMBULATORY_CARE_PROVIDER_SITE_OTHER): Payer: 59 | Admitting: Obstetrics and Gynecology

## 2013-03-15 ENCOUNTER — Encounter: Payer: Self-pay | Admitting: Obstetrics and Gynecology

## 2013-03-15 VITALS — BP 140/72 | Ht 61.0 in | Wt 166.0 lb

## 2013-03-15 DIAGNOSIS — Z711 Person with feared health complaint in whom no diagnosis is made: Secondary | ICD-10-CM

## 2013-03-15 NOTE — Progress Notes (Signed)
Subjective:  Pt concern over breast description as "dense" on mammogram,  Family hx : maternal grandmother's Half-sister(shared same mother)had breast cancer at young age , died from Br Ca in 92's. PT mother age70, no breast concerns. Pt with no sisters. No female br cancers in hx.    Patient ID: Carmen Mcintyre, female   DOB: 09/30/1965, 48 y.o.   MRN: [id number]  HPI  Currently no no HT, was briefly on HT for perimenopausal vasomotor sx in 2012 Review of Systems     Objective:   Physical Exam    defer Assessment:     Concern over breast cancer risk.      Plan:     Re-review mammogram , ask Dr Clarise Cruz input. See note in "remind me" function.    f/iu by phone 2 wk.

## 2013-04-15 ENCOUNTER — Other Ambulatory Visit: Payer: Self-pay

## 2013-04-15 MED ORDER — AMPHETAMINE-DEXTROAMPHETAMINE 20 MG PO TABS: 20.0000 mg | ORAL_TABLET | Freq: Every day | ORAL | Status: DC

## 2013-06-01 ENCOUNTER — Other Ambulatory Visit: Payer: Self-pay | Admitting: Family Medicine

## 2013-06-16 ENCOUNTER — Other Ambulatory Visit: Payer: Self-pay

## 2013-06-16 MED ORDER — AMPHETAMINE-DEXTROAMPHETAMINE 20 MG PO TABS: 20.0000 mg | ORAL_TABLET | Freq: Every day | ORAL | Status: DC

## 2013-06-29 ENCOUNTER — Ambulatory Visit: Payer: 59 | Admitting: Family Medicine

## 2013-07-20 ENCOUNTER — Encounter: Payer: Self-pay | Admitting: Family Medicine

## 2013-07-20 ENCOUNTER — Ambulatory Visit (INDEPENDENT_AMBULATORY_CARE_PROVIDER_SITE_OTHER): Payer: 59 | Admitting: Family Medicine

## 2013-07-20 VITALS — BP 118/80 | HR 71 | Resp 16 | Ht 60.5 in | Wt 162.0 lb

## 2013-07-20 DIAGNOSIS — R5382 Chronic fatigue, unspecified: Secondary | ICD-10-CM

## 2013-07-20 DIAGNOSIS — J309 Allergic rhinitis, unspecified: Secondary | ICD-10-CM

## 2013-07-20 DIAGNOSIS — E669 Obesity, unspecified: Secondary | ICD-10-CM

## 2013-07-20 DIAGNOSIS — J302 Other seasonal allergic rhinitis: Secondary | ICD-10-CM

## 2013-07-20 DIAGNOSIS — R5381 Other malaise: Secondary | ICD-10-CM

## 2013-07-20 DIAGNOSIS — R209 Unspecified disturbances of skin sensation: Secondary | ICD-10-CM

## 2013-07-20 DIAGNOSIS — E785 Hyperlipidemia, unspecified: Secondary | ICD-10-CM

## 2013-07-20 DIAGNOSIS — R5383 Other fatigue: Secondary | ICD-10-CM

## 2013-07-20 DIAGNOSIS — G9332 Myalgic encephalomyelitis/chronic fatigue syndrome: Secondary | ICD-10-CM

## 2013-07-20 DIAGNOSIS — R7301 Impaired fasting glucose: Secondary | ICD-10-CM

## 2013-07-20 DIAGNOSIS — M25522 Pain in left elbow: Secondary | ICD-10-CM | POA: Insufficient documentation

## 2013-07-20 DIAGNOSIS — R200 Anesthesia of skin: Secondary | ICD-10-CM | POA: Insufficient documentation

## 2013-07-20 DIAGNOSIS — M25529 Pain in unspecified elbow: Secondary | ICD-10-CM

## 2013-07-20 DIAGNOSIS — E8881 Metabolic syndrome: Secondary | ICD-10-CM

## 2013-07-20 MED ORDER — IBUPROFEN 800 MG PO TABS: 800.0000 mg | ORAL_TABLET | Freq: Three times a day (TID) | ORAL | Status: DC

## 2013-07-20 MED ORDER — PREDNISONE 5 MG PO TABS: 5.0000 mg | ORAL_TABLET | Freq: Two times a day (BID) | ORAL | Status: DC

## 2013-07-20 MED ORDER — PHENTERMINE HCL 37.5 MG PO TABS: 37.5000 mg | ORAL_TABLET | Freq: Every day | ORAL | Status: DC

## 2013-07-20 NOTE — Progress Notes (Signed)
Subjective:    Patient ID: Carmen Mcintyre, female    DOB: 09-26-1965, 48 y.o.   MRN: [id number]  HPI The PT is here for follow up and re-evaluation of chronic medical conditions, medication management and review of any available recent lab and radiology data.  Preventive health is updated, specifically  Cancer screening and Immunization.    The PT denies any adverse reactions to current medications since the last visit.  2 month h/o left elbow pain limiting flexion, initally was in both shoulders, direct pressure is painful also  2 month h/o tingling and numbness on right 3rd finger last  2 joints, now mainly at tip , states mid joint has improved, denies any other neurologic symptoms Inconsistent weight loss attempts have resulted in no change in weight, states she will work at this more consistently and goals set      Review of Systems See HPI Denies recent fever or chills. Denies sinus pressure, nasal congestion, ear pain or sore throat. Denies chest congestion, productive cough or wheezing. Denies chest pains, palpitations and leg swelling Denies abdominal pain, nausea, vomiting,diarrhea or constipation.   Denies dysuria, frequency, hesitancy or incontinence.  Denies headaches or seizures,  Denies  Uncontrolled depression, anxiety or insomnia. Denies skin break down or rash.        Objective:   Physical Exam  BP 118/80  Pulse 71  Resp 16  Ht 5' 0.5" (1.537 m)  Wt 162 lb (73.483 kg)  BMI 31.11 kg/m2  SpO2 98% Patient alert and oriented and in no cardiopulmonary distress.  HEENT: No facial asymmetry, EOMI, no sinus tenderness,  oropharynx pink and moist.  Neck supple no adenopathy.  Chest: Clear to auscultation bilaterally.  CVS: S1, S2 no murmurs, no S3.  ABD: Soft non tender.  Ext: No edema  MS: Adequate ROM spine, shoulders, hips and knees.Left elbow joint tender over medial aspect with limitation in flexion  Skin: Intact, no ulcerations or rash  noted.  Psych: Good eye contact, normal affect. Memory intact not anxious or depressed appearing.  CNS: CN 2-12 intact,       Assessment & Plan:  CHRONIC FATIGUE SYNDROME Controlled on medication continue current dose  Pt encouraged to commit to regular exercise that she enjoys also  Elbow pain, left Short sharp anti inflammatory course prescribed, pt to have  Xray of elbow and if symptom persists she will be referred to ortho, she is to call back if needed  Finger numbness Denies any other neuro symptoms states seems to be spontaneously improving. No known f/h of neurologic disease. I havde advised neuro eval as unable to make clinical assesswment based on presenting symptom, no "typical nerve distribution" She will be in touch if she decides to pursue this [place] Patient educated about the importance of limiting  Carbohydrate intake , the need to commit to daily physical activity for a minimum of 30 minutes , and to commit weight loss. The fact that changes in all these areas will reduce or eliminate all together the development of diabetes is stressed.   Updated lab needed at/ before next visit.   Seasonal allergies Controlled, no change in medication   DYSLIPIDEMIA Hyperlipidemia:Low fat diet discussed and encouraged.  Updated lab needed at/ before next visit.   Obesity (BMI 30.0-34.9) unchanged Patient re-educated about  the importance of commitment to a  minimum of 150 minutes of exercise per week. The importance of healthy food choices with portion control discussed. Encouraged  to start a food diary, count calories and to consider  joining a support group. Sample diet sheets offered. Goals set by the patient for the next several months.     Metabolic syndrome X The increased risk of cardiovascular disease associated with this diagnosis, and the need to consistently work on lifestyle to change this is discussed. Following  a  heart  healthy diet ,commitment to 30 minutes of exercise at least 5 days per week, as well as control of blood sugar and cholesterol , and achieving a healthy weight are all the areas to be addressed .

## 2013-07-20 NOTE — Patient Instructions (Addendum)
F/u in 3.5 month, call if you need me before  New for left elbow pain is a 5 day course of ibuprofen and prednisone, also xray of the left elbow today. If you still have limitation in movement after this you NEED to be seen by orthopedics, so pLEASE get back in touch if this is the case  I recommend a neurologic evaluation re right  finger symptoms , let me know what your decision is after you have thought about this some more also please    Please ENJOY the changes that you will be making, one day at a time, for a healthier lifestyle  Fasting lipid, cmp, hBA1C and TSH and CBC in 3.5 month

## 2013-07-24 ENCOUNTER — Telehealth: Payer: Self-pay | Admitting: Family Medicine

## 2013-07-24 DIAGNOSIS — E8881 Metabolic syndrome: Secondary | ICD-10-CM | POA: Insufficient documentation

## 2013-07-24 NOTE — Assessment & Plan Note (Signed)
Controlled, no change in medication  

## 2013-07-24 NOTE — Assessment & Plan Note (Signed)
unchanged Patient re-educated about  the importance of commitment to a  minimum of 150 minutes of exercise per week. The importance of healthy food choices with portion control discussed. Encouraged to start a food diary, count calories and to consider  joining a support group. Sample diet sheets offered. Goals set by the patient for the next several months.    

## 2013-07-24 NOTE — Assessment & Plan Note (Signed)
Short sharp anti inflammatory course prescribed, pt to have  Xray of elbow and if symptom persists she will be referred to ortho, she is to call back if needed

## 2013-07-24 NOTE — Assessment & Plan Note (Signed)
Denies any other neuro symptoms states seems to be spontaneously improving. No known f/h of neurologic disease. I havde advised neuro eval as unable to make clinical assesswment based on presenting symptom, no "typical nerve distribution" She will be in touch if she decides to pursue this furhter

## 2013-07-24 NOTE — Assessment & Plan Note (Signed)
The increased risk of cardiovascular disease associated with this diagnosis, and the need to consistently work on lifestyle to change this is discussed. Following  a  heart healthy diet ,commitment to 30 minutes of exercise at least 5 days per week, as well as control of blood sugar and cholesterol , and achieving a healthy weight are all the areas to be addressed .  

## 2013-07-24 NOTE — Telephone Encounter (Signed)
Pls call pt and let her know as  An update on new type of  mammogram for "dense breast tissue" , which I forgot to mention at recent visit, the standard question is asked of pt when call to schedule mammogram at breast ctr is if pt desires the newer supposedly more specific test , and this can be done at an additional $50, that was my recent experience and a patient told me the same thing. Also remind her she needs the elbow xrayed, that he c/o  At recent visit, esp if still hurts

## 2013-07-24 NOTE — Assessment & Plan Note (Signed)
Controlled on medication continue current dose  Pt encouraged to commit to regular exercise that she enjoys also

## 2013-07-24 NOTE — Assessment & Plan Note (Signed)
Hyperlipidemia:Low fat diet discussed and encouraged.  Updated lab needed at/ before next visit.  

## 2013-07-24 NOTE — Assessment & Plan Note (Signed)
Patient educated about the importance of limiting  Carbohydrate intake , the need to commit to daily physical activity for a minimum of 30 minutes , and to commit weight loss. The fact that changes in all these areas will reduce or eliminate all together the development of diabetes is stressed.   Updated lab needed at/ before next visit.  

## 2013-07-26 NOTE — Telephone Encounter (Signed)
Called and left message for patient to return call.  

## 2013-08-09 NOTE — Telephone Encounter (Signed)
Unable to reach patient by phone. Letter sent to home address. 

## 2013-08-24 ENCOUNTER — Ambulatory Visit (HOSPITAL_COMMUNITY)
Admission: RE | Admit: 2013-08-24 | Discharge: 2013-08-24 | Disposition: A | Discharge status: Home / Self Care | Payer: 59 | Source: Ambulatory Visit | Attending: Family Medicine | Admitting: Family Medicine

## 2013-08-24 DIAGNOSIS — M25522 Pain in left elbow: Secondary | ICD-10-CM

## 2013-08-24 DIAGNOSIS — M25529 Pain in unspecified elbow: Secondary | ICD-10-CM | POA: Insufficient documentation

## 2013-08-31 ENCOUNTER — Other Ambulatory Visit: Payer: Self-pay

## 2013-08-31 DIAGNOSIS — M25522 Pain in left elbow: Secondary | ICD-10-CM

## 2013-09-13 ENCOUNTER — Encounter: Payer: Self-pay | Admitting: Sports Medicine

## 2013-09-13 ENCOUNTER — Ambulatory Visit (INDEPENDENT_AMBULATORY_CARE_PROVIDER_SITE_OTHER): Payer: 59 | Admitting: Sports Medicine

## 2013-09-13 VITALS — BP 137/96 | Ht 60.0 in | Wt 152.0 lb

## 2013-09-13 DIAGNOSIS — M7710 Lateral epicondylitis, unspecified elbow: Secondary | ICD-10-CM

## 2013-09-13 DIAGNOSIS — M7712 Lateral epicondylitis, left elbow: Secondary | ICD-10-CM

## 2013-09-13 NOTE — Progress Notes (Signed)
   Subjective:    Patient ID: Carmen Mcintyre, female    DOB: 1965/11/12, 48 y.o.   MRN: [id number]  HPI  The patient is a 48yo right-hand dominant female who presents with left elbow pain for the past 3 months. Her pain is located over the superolateral aspect of her left elbow. Her symptoms are aggravated with lifting, turning a steering wheel, or pulling items towards her. She has tried naproxen and prednisone burst 10 mg x 5 days in May, which helped relieve her primary symptoms briefly. 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 Prior to the injury, she does not note any particular change in her activities. However, about 2 months prior to the onset of her pain she had stopped participating in tennis due to the off-season beginning. She has since to returned to tennis activity.  Review of Systems Pertinent Positive: leftt elbow pain Pertinent Negative: numbness, tingling, pallor, weakness    Objective:   Physical Exam  General: Pleasant, NAD, resting comfortably MSK: Grossly full flexion/ext, pronation, supination bilateral upper extremities. Pain with palpation just distal to left lateral epicondyle in muscular/tendinous junction, made worse with resisted pronation/supination. No bony tenderness. No triceps insertion tenderness. No swelling visible or bruising. Skin: Intact, no erythema or induration. Vasc: +2 bilateral radial pulses Neuro: Sensation and strength intact throughout    Assessment & Plan:  1. Left lateral epicondylitis  Plan: -Modify activities until pain free. Home rehabilitation program was demonstrated and given to the patient for tennis elbow return to activity. -Start Phase I with rest, ice, compression with elbow strap. Progress as tolerated through rehabilitation program -Continue Aleeve as directed -The patient declined a trial of a CST injection today, opting for more conservative treatment. -Plan to follow-up in  3-4 weeks or sooner if symptoms worsen. If pain persists, consider [place] to investigate further.

## 2013-09-13 NOTE — Patient Instructions (Addendum)
Follow the home rehabilitation program given in the office today. Emphasize rest, ice, compression, and elevation. Follow-up in 3-4 weeks or sooner if worsening.

## 2013-10-28 ENCOUNTER — Encounter: Payer: Self-pay | Admitting: Family Medicine

## 2013-10-28 ENCOUNTER — Ambulatory Visit (INDEPENDENT_AMBULATORY_CARE_PROVIDER_SITE_OTHER): Payer: 59 | Admitting: Family Medicine

## 2013-10-28 ENCOUNTER — Telehealth: Payer: Self-pay | Admitting: *Deleted

## 2013-10-28 VITALS — BP 132/89 | HR 87 | Ht 60.0 in | Wt 150.0 lb

## 2013-10-28 DIAGNOSIS — M25522 Pain in left elbow: Secondary | ICD-10-CM

## 2013-10-28 DIAGNOSIS — M25529 Pain in unspecified elbow: Secondary | ICD-10-CM

## 2013-10-28 MED ORDER — METHYLPREDNISOLONE ACETATE 40 MG/ML IJ SUSP
40.0000 mg | Freq: Once | INTRAMUSCULAR | Status: AC
  Administered 2013-10-28: 40 mg via INTRA_ARTICULAR

## 2013-10-28 NOTE — Assessment & Plan Note (Signed)
-  Left lateral epicondylitis persisting. -Improvement with corticosteroid injection on 10/28/13. -She will continue her home exercises and followup if needed.

## 2013-10-28 NOTE — Progress Notes (Signed)
Subjective: The patient presents today for a corticosteroid injection of her left lateral epicondylitis. She has been doing home exercises with mild relief of pain. Her symptoms initially improved, but have since worsened. ROS: Pain and tenderness. Denies numbness, tingling.  Objective: Significant tenderness to palpation over the tendinous insertion of the left lateral extensor tendons. No swelling, erythema, or induration. She is neurovascularly intact distally.  Procedure: After obtaining overextend patient's skin was cleansed with alcohol and Betadine.  She was subsequently injected with a 3-1 mixture of 1% lidocaine plain and 40mg  methylprednisolone into the left lateral epicondyles region.  She tolerated the procedure with no complications.  Prior to the procedure risks, benefits and treatment alternatives were discussed.    Assessment: Left lateral epicondylitis.  Plan: She tolerated the injection well today. She may ice the area if needed. She should resume her home exercises. We will see her back if needed.

## 2013-10-28 NOTE — Telephone Encounter (Signed)
Patient called after her injection this morning, was in more pain.  Michela Pitcher it could take a while for medicine to work, she could use ibuprofen and ice to help, and to call back if needed

## 2013-11-23 ENCOUNTER — Other Ambulatory Visit: Payer: Self-pay

## 2013-11-23 MED ORDER — AMPHETAMINE-DEXTROAMPHETAMINE 20 MG PO TABS
20.0000 mg | ORAL_TABLET | Freq: Every day | ORAL | Status: DC
Start: 2013-11-23 — End: 2014-03-13

## 2013-11-24 ENCOUNTER — Ambulatory Visit: Payer: 59 | Admitting: Family Medicine

## 2014-01-26 ENCOUNTER — Telehealth: Payer: Self-pay

## 2014-01-26 DIAGNOSIS — R7301 Impaired fasting glucose: Secondary | ICD-10-CM

## 2014-01-26 DIAGNOSIS — E8881 Metabolic syndrome: Secondary | ICD-10-CM

## 2014-01-26 DIAGNOSIS — E785 Hyperlipidemia, unspecified: Secondary | ICD-10-CM

## 2014-01-26 LAB — COMPREHENSIVE METABOLIC PANEL
ALT: 21 U/L (ref 0–35)
AST: 20 U/L (ref 0–37)
Albumin: 4.3 g/dL (ref 3.5–5.2)
Alkaline Phosphatase: 81 U/L (ref 39–117)
BILIRUBIN TOTAL: 0.5 mg/dL (ref 0.2–1.2)
BUN: 11 mg/dL (ref 6–23)
CALCIUM: 9.4 mg/dL (ref 8.4–10.5)
CHLORIDE: 102 meq/L (ref 96–112)
CO2: 23 mEq/L (ref 19–32)
CREATININE: 0.79 mg/dL (ref 0.50–1.10)
GLUCOSE: 110 mg/dL — AB (ref 70–99)
Potassium: 4.5 mEq/L (ref 3.5–5.3)
Sodium: 139 mEq/L (ref 135–145)
TOTAL PROTEIN: 7 g/dL (ref 6.0–8.3)

## 2014-01-26 LAB — LIPID PANEL
CHOL/HDL RATIO: 3.7 ratio
Cholesterol: 160 mg/dL (ref 0–200)
HDL: 43 mg/dL (ref >39)
LDL Cholesterol: 90 mg/dL (ref 0–99)
Triglycerides: 135 mg/dL (ref <150)
VLDL: 27 mg/dL (ref 0–40)

## 2014-01-26 LAB — HEMOGLOBIN A1C
HEMOGLOBIN A1C: 5.8 % — AB (ref <5.7)
Mean Plasma Glucose: 120 mg/dL — ABNORMAL HIGH (ref <117)

## 2014-01-26 LAB — TSH: TSH: 0.905 u[IU]/mL (ref 0.350–4.500)

## 2014-01-26 NOTE — Telephone Encounter (Signed)
Labs re-ordered

## 2014-01-27 ENCOUNTER — Other Ambulatory Visit: Payer: Self-pay

## 2014-01-27 ENCOUNTER — Ambulatory Visit (INDEPENDENT_AMBULATORY_CARE_PROVIDER_SITE_OTHER): Payer: 59 | Admitting: Family Medicine

## 2014-01-27 ENCOUNTER — Encounter: Payer: Self-pay | Admitting: Family Medicine

## 2014-01-27 VITALS — BP 138/90 | HR 91 | Resp 16 | Ht 60.0 in | Wt 163.4 lb

## 2014-01-27 DIAGNOSIS — G9332 Myalgic encephalomyelitis/chronic fatigue syndrome: Secondary | ICD-10-CM

## 2014-01-27 DIAGNOSIS — Z1231 Encounter for screening mammogram for malignant neoplasm of breast: Secondary | ICD-10-CM

## 2014-01-27 DIAGNOSIS — M25522 Pain in left elbow: Secondary | ICD-10-CM

## 2014-01-27 DIAGNOSIS — R7301 Impaired fasting glucose: Secondary | ICD-10-CM

## 2014-01-27 DIAGNOSIS — F5105 Insomnia due to other mental disorder: Secondary | ICD-10-CM

## 2014-01-27 DIAGNOSIS — R232 Flushing: Secondary | ICD-10-CM

## 2014-01-27 DIAGNOSIS — J302 Other seasonal allergic rhinitis: Secondary | ICD-10-CM

## 2014-01-27 DIAGNOSIS — E669 Obesity, unspecified: Secondary | ICD-10-CM

## 2014-01-27 DIAGNOSIS — E785 Hyperlipidemia, unspecified: Secondary | ICD-10-CM

## 2014-01-27 DIAGNOSIS — N951 Menopausal and female climacteric states: Secondary | ICD-10-CM

## 2014-01-27 DIAGNOSIS — J209 Acute bronchitis, unspecified: Secondary | ICD-10-CM

## 2014-01-27 DIAGNOSIS — R5382 Chronic fatigue, unspecified: Secondary | ICD-10-CM

## 2014-01-27 DIAGNOSIS — R030 Elevated blood-pressure reading, without diagnosis of hypertension: Secondary | ICD-10-CM

## 2014-01-27 DIAGNOSIS — R7309 Other abnormal glucose: Secondary | ICD-10-CM

## 2014-01-27 DIAGNOSIS — E8881 Metabolic syndrome: Secondary | ICD-10-CM

## 2014-01-27 DIAGNOSIS — F409 Phobic anxiety disorder, unspecified: Secondary | ICD-10-CM

## 2014-01-27 DIAGNOSIS — R7303 Prediabetes: Secondary | ICD-10-CM

## 2014-01-27 MED ORDER — FLUTICASONE PROPIONATE 50 MCG/ACT NA SUSP: 2.0000 | Freq: Every day | NASAL | Status: DC

## 2014-01-27 MED ORDER — PREDNISONE 5 MG PO TABS: 5.0000 mg | ORAL_TABLET | Freq: Two times a day (BID) | ORAL | Status: DC

## 2014-01-27 MED ORDER — BENZONATATE 100 MG PO CAPS: 100.0000 mg | ORAL_CAPSULE | Freq: Two times a day (BID) | ORAL | Status: DC | PRN

## 2014-01-27 MED ORDER — ALPRAZOLAM 0.25 MG PO TABS: ORAL_TABLET | ORAL | Status: DC

## 2014-01-27 MED ORDER — AZITHROMYCIN 250 MG PO TABS: ORAL_TABLET | ORAL | Status: DC

## 2014-01-27 MED ORDER — PROMETHAZINE-DM 6.25-15 MG/5ML PO SYRP: 5.0000 mL | ORAL_SOLUTION | Freq: Every evening | ORAL | Status: DC | PRN

## 2014-01-27 NOTE — Progress Notes (Signed)
Subjective:    Patient ID: Carmen Mcintyre, female    DOB: Feb 07, 1966, 48 y.o.   MRN: [id number]  HPI The PT is here for follow up and re-evaluation of chronic medical conditions, medication management and review of any available recent lab and radiology data.  Preventive health is updated, specifically  Cancer screening and Immunization.   Questions or concerns regarding consultations or procedures which the PT has had in the interim are  Addressed.Has been to ortho re elbow pain The PT denies any adverse reactions to current medications since the last visit.  3 day h/o increased post nasal drainage, excessive cough, last night unable to sleep, no fever or chills noted      Review of Systems See HPI  Denies chest pains, palpitations and leg swelling Denies abdominal pain, nausea, vomiting,diarrhea or constipation.   Denies dysuria, frequency, hesitancy or incontinence. Denies joint pain, swelling and limitation in mobility. Denies headaches, seizures, numbness, or tingling. Denies depression, anxiety or insomnia. Denies skin break down or rash.        Objective:   Physical Exam BP 138/90 mmHg  Pulse 91  Resp 16  Ht 5' (1.524 m)  Wt 163 lb 6.4 oz (74.118 kg)  BMI 31.91 kg/m2  SpO2 100% Patient alert and oriented and in no cardiopulmonary distress.  HEENT: No facial asymmetry, EOMI,   oropharynx pink and moist.  Neck supple no JVD, no mass. No sinus tenderness, TM clear Chest: decreased air entry, few basilar crackles  CVS: S1, S2 no murmurs, no S3.Regular rate.  ABD: Soft non tender.   Ext: No edema  MS: Adequate ROM spine, shoulders, hips and knees.  Skin: Intact, no ulcerations or rash noted.  Psych: Good eye contact, normal affect. Memory intact not anxious or depressed appearing.  CNS: CN 2-12 intact, power,  normal throughout.no focal deficits noted.        Assessment & Plan:  Acute bronchitis z pack, prednisone for 5 days, phenergan  dm  Seasonal allergies Uncontrolled add flonase daily to zyrtec  Impaired fasting glucose New established dx of prediabetes Patient educated about the importance of limiting  Carbohydrate intake , the need to commit to daily physical activity for a minimum of 30 minutes , and to commit weight loss. The fact that changes in all these areas will reduce or eliminate all together the development of diabetes is stressed.   Sodas and tea felt to be the greatest culprits, pt will work  Aggressively on both  Metabolic syndrome X The increased risk of cardiovascular disease associated with this diagnosis, and the need to consistently work on lifestyle to change this is discussed. Following  a  heart healthy diet ,commitment to 30 minutes of exercise at least 5 days per week, as well as control of blood sugar and cholesterol , and achieving a healthy weight are all the areas to be addressed .   Obesity (BMI 30.0-34.9) Deteriorated. Patient re-educated about  the importance of commitment to a  minimum of 150 minutes of exercise per week. The importance of healthy food choices with portion control discussed. Encouraged to start a food diary, count calories and to consider  joining a support group. Sample diet sheets offered. Goals set by the patient for the next several months.     Hyperlipemia Resolved with change in diet , which is excellent  CHRONIC FATIGUE SYNDROME Good response to adderall , continue same  Hot flashes Improved with effexor continue same, also acts as mood  stabilizer for anxiety  Prehypertension DASH diet and commitment to daily physical activity for a minimum of 30 minutes discussed and encouraged, as a part of hypertension management. The importance of attaining a healthy weight is also discussed.   Insomnia due to anxiety and fear xanax on as needed basis, with limitation in use   Elbow pain, left Has been evaluated by sports med, no complaint today of  pain

## 2014-01-27 NOTE — Patient Instructions (Addendum)
F/u in 3 month, call if you need me before  Medication is sent in for acute bronchitis, prednisone, tessalon perles, phenergan dM and and a z pack  Flonase daily is to be started to help with allergy control  Workion lifestyle changes, your cholesterol is now EXCELLENT, congrats  Blood pressure, blood sugar and weight will all be better since you know what you need to do, and you want to do it!  HBA1C, and chem 7 and in 3 month, fasting and CBC

## 2014-01-28 DIAGNOSIS — R030 Elevated blood-pressure reading, without diagnosis of hypertension: Secondary | ICD-10-CM | POA: Insufficient documentation

## 2014-01-28 DIAGNOSIS — J209 Acute bronchitis, unspecified: Secondary | ICD-10-CM | POA: Insufficient documentation

## 2014-01-28 NOTE — Assessment & Plan Note (Signed)
Deteriorated. Patient re-educated about  the importance of commitment to a  minimum of 150 minutes of exercise per week. The importance of healthy food choices with portion control discussed. Encouraged to start a food diary, count calories and to consider  joining a support group. Sample diet sheets offered. Goals set by the patient for the next several months.    

## 2014-01-28 NOTE — Assessment & Plan Note (Signed)
Good response to adderall , continue same

## 2014-01-28 NOTE — Assessment & Plan Note (Signed)
z pack, prednisone for 5 days, phenergan dm

## 2014-01-28 NOTE — Assessment & Plan Note (Signed)
Has been evaluated by sports med, no complaint today of pain

## 2014-01-28 NOTE — Assessment & Plan Note (Signed)
The increased risk of cardiovascular disease associated with this diagnosis, and the need to consistently work on lifestyle to change this is discussed. Following  a  heart healthy diet ,commitment to 30 minutes of exercise at least 5 days per week, as well as control of blood sugar and cholesterol , and achieving a healthy weight are all the areas to be addressed .  

## 2014-01-28 NOTE — Assessment & Plan Note (Signed)
Improved with effexor continue same, also acts as mood stabilizer for anxiety

## 2014-01-28 NOTE — Assessment & Plan Note (Signed)
DASH diet and commitment to daily physical activity for a minimum of 30 minutes discussed and encouraged, as a part of hypertension management. The importance of attaining a healthy weight is also discussed.

## 2014-01-28 NOTE — Assessment & Plan Note (Signed)
Resolved with change in diet , which is excellent

## 2014-01-28 NOTE — Assessment & Plan Note (Signed)
Uncontrolled add flonase daily to zyrtec

## 2014-01-28 NOTE — Assessment & Plan Note (Signed)
New established dx of prediabetes Patient educated about the importance of limiting  Carbohydrate intake , the need to commit to daily physical activity for a minimum of 30 minutes , and to commit weight loss. The fact that changes in all these areas will reduce or eliminate all together the development of diabetes is stressed.   Sodas and tea felt to be the greatest culprits, pt will work  Aggressively on both

## 2014-01-28 NOTE — Assessment & Plan Note (Signed)
xanax on as needed basis, with limitation in use

## 2014-01-30 ENCOUNTER — Ambulatory Visit
Admission: RE | Admit: 2014-01-30 | Discharge: 2014-01-30 | Disposition: A | Discharge status: Home / Self Care | Payer: 59 | Source: Ambulatory Visit

## 2014-01-30 DIAGNOSIS — Z1231 Encounter for screening mammogram for malignant neoplasm of breast: Secondary | ICD-10-CM

## 2014-03-13 ENCOUNTER — Other Ambulatory Visit: Payer: Self-pay

## 2014-03-13 MED ORDER — AMPHETAMINE-DEXTROAMPHETAMINE 20 MG PO TABS: 20.0000 mg | ORAL_TABLET | Freq: Every day | ORAL | Status: DC

## 2014-05-23 ENCOUNTER — Ambulatory Visit: Payer: 59 | Admitting: Family Medicine

## 2014-05-25 ENCOUNTER — Encounter: Payer: Self-pay | Admitting: Family Medicine

## 2014-05-25 ENCOUNTER — Ambulatory Visit (INDEPENDENT_AMBULATORY_CARE_PROVIDER_SITE_OTHER): Payer: 59 | Admitting: Family Medicine

## 2014-05-25 VITALS — BP 124/82 | HR 100 | Resp 16 | Ht 60.0 in | Wt 158.0 lb

## 2014-05-25 DIAGNOSIS — E669 Obesity, unspecified: Secondary | ICD-10-CM

## 2014-05-25 DIAGNOSIS — G9332 Myalgic encephalomyelitis/chronic fatigue syndrome: Secondary | ICD-10-CM

## 2014-05-25 DIAGNOSIS — R5382 Chronic fatigue, unspecified: Secondary | ICD-10-CM

## 2014-05-25 DIAGNOSIS — F411 Generalized anxiety disorder: Secondary | ICD-10-CM

## 2014-05-25 DIAGNOSIS — F5105 Insomnia due to other mental disorder: Secondary | ICD-10-CM

## 2014-05-25 DIAGNOSIS — J302 Other seasonal allergic rhinitis: Secondary | ICD-10-CM

## 2014-05-25 DIAGNOSIS — R7309 Other abnormal glucose: Secondary | ICD-10-CM

## 2014-05-25 DIAGNOSIS — R7303 Prediabetes: Secondary | ICD-10-CM

## 2014-05-25 DIAGNOSIS — T753XXA Motion sickness, initial encounter: Secondary | ICD-10-CM | POA: Insufficient documentation

## 2014-05-25 DIAGNOSIS — F409 Phobic anxiety disorder, unspecified: Secondary | ICD-10-CM

## 2014-05-25 DIAGNOSIS — R7301 Impaired fasting glucose: Secondary | ICD-10-CM

## 2014-05-25 LAB — CBC WITH DIFFERENTIAL/PLATELET
BASOS ABS: 0 10*3/uL (ref 0.0–0.1)
BASOS PCT: 0 % (ref 0–1)
EOS ABS: 0.1 10*3/uL (ref 0.0–0.7)
EOS PCT: 1 % (ref 0–5)
HCT: 41.5 % (ref 36.0–46.0)
HEMOGLOBIN: 14.1 g/dL (ref 12.0–15.0)
Lymphocytes Relative: 36 % (ref 12–46)
Lymphs Abs: 2.6 10*3/uL (ref 0.7–4.0)
MCH: 31.1 pg (ref 26.0–34.0)
MCHC: 34 g/dL (ref 30.0–36.0)
MCV: 91.4 fL (ref 78.0–100.0)
MONOS PCT: 5 % (ref 3–12)
MPV: 10.9 fL (ref 9.4–12.4)
Monocytes Absolute: 0.4 10*3/uL (ref 0.1–1.0)
Neutro Abs: 4.1 10*3/uL (ref 1.7–7.7)
Neutrophils Relative %: 58 % (ref 43–77)
PLATELETS: 271 10*3/uL (ref 150–400)
RBC: 4.54 MIL/uL (ref 3.87–5.11)
RDW: 13.2 % (ref 11.5–15.5)
WBC: 7.1 10*3/uL (ref 4.0–10.5)

## 2014-05-25 LAB — BASIC METABOLIC PANEL
BUN: 13 mg/dL (ref 6–23)
CO2: 29 meq/L (ref 19–32)
Calcium: 9.7 mg/dL (ref 8.4–10.5)
Chloride: 100 mEq/L (ref 96–112)
Creat: 0.75 mg/dL (ref 0.50–1.10)
Glucose, Bld: 93 mg/dL (ref 70–99)
Potassium: 4 mEq/L (ref 3.5–5.3)
SODIUM: 140 meq/L (ref 135–145)

## 2014-05-25 LAB — HEMOGLOBIN A1C
Hgb A1c MFr Bld: 5.8 % — ABNORMAL HIGH (ref <5.7)
Mean Plasma Glucose: 120 mg/dL — ABNORMAL HIGH (ref <117)

## 2014-05-25 MED ORDER — VENLAFAXINE HCL ER 37.5 MG PO CP24: ORAL_CAPSULE | ORAL | Status: DC

## 2014-05-25 MED ORDER — SCOPOLAMINE 1 MG/3DAYS TD PT72: 1.0000 | MEDICATED_PATCH | TRANSDERMAL | Status: DC

## 2014-05-25 MED ORDER — AMPHETAMINE-DEXTROAMPHETAMINE 20 MG PO TABS: 20.0000 mg | ORAL_TABLET | Freq: Every day | ORAL | Status: DC

## 2014-05-25 NOTE — Patient Instructions (Addendum)
F/u in 4 month, call if you need me before  Congrats on IMPROVED health habits and IMPROVED health, you are referred to nutritionist for help with this   Aderrall request  For early fill as you will be away, when refill is due. Please plan to fill next Wednesday  Trans scopolamine prescribed for motion sickness, trial of 1 patch advised prior to leaving , you will have sufficient patches for use HBA1C in 4 months Great that you are enjoying walking regularly again  Thanks for choosing [facility], we consider it a privelige to serve you.

## 2014-05-26 ENCOUNTER — Other Ambulatory Visit: Payer: Self-pay

## 2014-05-26 DIAGNOSIS — Z1159 Encounter for screening for other viral diseases: Secondary | ICD-10-CM

## 2014-05-26 NOTE — Assessment & Plan Note (Signed)
Controlled, no change in medication  

## 2014-05-26 NOTE — Assessment & Plan Note (Signed)
Unchanged, despite lifestyle change, refer  To dietitian for education she is interested also Patient educated about the importance of limiting  Carbohydrate intake , the need to commit to daily physical activity for a minimum of 30 minutes , and to commit weight loss. The fact that changes in all these areas will reduce or eliminate all together the development of diabetes is stressed.

## 2014-05-26 NOTE — Assessment & Plan Note (Signed)
Adequately controlled on adderal dose, will need early fill as [place] Has also started daily walk which is very good

## 2014-05-26 NOTE — Assessment & Plan Note (Signed)
Markedly improved, very limited use of xanax

## 2014-05-26 NOTE — Assessment & Plan Note (Signed)
C/o motion sickness on car drives, anticipates a lot of road travel during upcoming vacation, trans scopolamine prescribed , advised trial of the med before leaving to se if adverse s/e

## 2014-05-26 NOTE — Assessment & Plan Note (Addendum)
Stable and controlled on current med, no chnage

## 2014-05-26 NOTE — Assessment & Plan Note (Signed)
Improved. Pt applauded on succesful weight loss through lifestyle change, and encouraged to continue same. Weight loss goal set for the next several months.  

## 2014-05-26 NOTE — Progress Notes (Signed)
Subjective:    Patient ID: Carmen Mcintyre, female    DOB: 31-Jul-1965, 49 y.o.   MRN: [id number]  HPI The PT is here for follow up and re-evaluation of chronic medical conditions, medication management and review of any available recent lab and radiology data.  Preventive health is updated, specifically  Cancer screening and Immunization.   Questions or concerns regarding consultations or procedures which the PT has had in the interim are  addressed. The PT denies any adverse reactions to current medications since the last visit.   Has upcoming 3 week  vacation and will need aderall filled early Has concerns about motion sickness and requests medication for this Has committed to daily walking, with good effect also working on healthy diet , she has lost 5 pounds which is excellent. Will benefit from and is interested in diabetic ed as far as carb awareness is and carb counting , she will e referred     Review of Systems See HPI Denies recent fever or chills. Denies sinus pressure, nasal congestion, ear pain or sore throat. Denies chest congestion, productive cough or wheezing. Denies chest pains, palpitations and leg swelling Denies abdominal pain, nausea, vomiting,diarrhea or constipation.   Denies dysuria, frequency, hesitancy or incontinence. Denies joint pain, swelling and limitation in mobility. Denies headaches, seizures, numbness, or tingling. Denies uncontrolled  depression, anxiety or insomnia. Denies skin break down or rash.        Objective:   Physical Exam  BP 124/82 mmHg  Pulse 100  Resp 16  Ht 5' (1.524 m)  Wt 158 lb (71.668 kg)  BMI 30.86 kg/m2  SpO2 98% Patient alert and oriented and in no cardiopulmonary distress.  HEENT: No facial asymmetry, EOMI,   oropharynx pink and moist.  Neck supple no JVD, no mass.  Chest: Clear to auscultation bilaterally.  CVS: S1, S2 no murmurs, no S3.Regular rate.  ABD: Soft non tender.   Ext: No edema  MS: Adequate  ROM spine, shoulders, hips and knees.  Skin: Intact, no ulcerations or rash noted.  Psych: Good eye contact, normal affect. Memory intact not anxious or depressed appearing.  CNS: CN 2-12 intact, power,  normal throughout.no focal deficits noted.       Assessment & Plan:  Obesity (BMI 30.0-34.9) Improved. Pt applauded on succesful weight loss through lifestyle change, and encouraged to continue same. Weight loss goal set for the next several months.    Insomnia due to anxiety and fear Markedly improved, very limited use of xanax   Prediabetes Unchanged, despite lifestyle change, refer  To dietitian for education she is interested also Patient educated about the importance of limiting  Carbohydrate intake , the need to commit to daily physical activity for a minimum of 30 minutes , and to commit weight loss. The fact that changes in all these areas will reduce or eliminate all together the development of diabetes is stressed.      GAD (generalized anxiety disorder) Stable and controlled on current med, no chnage   Chronic fatigue disorder  Adequately controlled on adderal dose, will need early fill as will be [place] 3 [place] Has also started daily walk which is very good   Motion sickness C/o motion sickness on car drives, anticipates a lot of road travel during upcoming vacation, trans scopolamine prescribed , advised trial of the med before leaving to se if adverse s/e   Seasonal allergies Controlled, no change in medication

## 2014-08-10 ENCOUNTER — Encounter: Payer: Self-pay | Admitting: Nutrition

## 2014-08-10 ENCOUNTER — Encounter: Payer: 59 | Attending: "Endocrinology | Admitting: Nutrition

## 2014-08-10 VITALS — Ht 60.0 in | Wt 164.0 lb

## 2014-08-10 DIAGNOSIS — Z6832 Body mass index (BMI) 32.0-32.9, adult: Secondary | ICD-10-CM | POA: Insufficient documentation

## 2014-08-10 DIAGNOSIS — Z713 Dietary counseling and surveillance: Secondary | ICD-10-CM | POA: Insufficient documentation

## 2014-08-10 DIAGNOSIS — R739 Hyperglycemia, unspecified: Secondary | ICD-10-CM | POA: Insufficient documentation

## 2014-08-10 DIAGNOSIS — E669 Obesity, unspecified: Secondary | ICD-10-CM | POA: Insufficient documentation

## 2014-08-10 NOTE — Patient Instructions (Signed)
Goals: 1. Follow Plate Method as discussed. 2. Increase fresh fruits and vegetables. 3. Cut out sweets, junk food, sodas, tea and chips, cookies, desserts. 4. Drink 8 glasses of water daily. 5. Cut out snacks between meals. 6. Exercise 30 minutes 5 days a week. 7. Lose 1-2 lbs per week til next visit. 8. Keep food journal daily. 9. Keep A1C below 5.7%.

## 2014-08-10 NOTE — Progress Notes (Signed)
  Medical Nutrition Therapy:  Appt start time: 0900 end time:  1000.  Assessment:  Primary concerns today: Weight losst. She was down to 156 lbs in April and has since put those pounds back on to her base weight 164 lbs. Physical activity: walks some 2-3 times per week. Most recent A1C 5.8%.She notes she has family history of Type 2 DM and had Gestational DM in one of her pregnancies. She does the cooking and shopping in the home. Most foods are grilled, baked and boiled. Diet is low in fresh fruits, vegetables and whole grains. Excessive in calories from sugar and fat. Not enough exercise to provide weight loss. Doesn't drink much water and drinks sodas. Willing to change.  Preferred Learning Style  Auditory  Visual  Hands on   Learning Readiness:  Ready  Change in progress  MEDICATIONS: See list   DIETARY INTAKE:   24-hr recall:  B ( AM): 1/2 c yogurt with bran cereal, 1/2 banana and coffee  Snk ( AM): something sweet at times. L ( PM): [employer] chicken biscuit, Sweet tea Snk ( PM): none D ( PM): BBQ sandwich and potato salad, unsweet tea, 1 slice of cake. Snk ( PM): fruit, popcorn, sweet Beverages: water, sodas-regular   Usual physical activity: walking causally  Estimated energy needs: 1200 calories 135 g carbohydrates 90 g protein 33 g fat  Progress Towards Goal(s):  In progress.   Nutritional Diagnosis:  NB-1.1 Food and nutrition-related knowledge deficit As related to Prediabetes and obesity.  As evidenced by A1C 5.8% and BMI >30.    Intervention:  Nutrition and diabetes education provided on lower fat lower sodium, high fiber diet, CHO counting, meal planning, portion control. Benefits of exercising for weight loss, weight loss tips, A1C goals and need to avoid snacks, processed junk food, cakes, cookies, desserts, sodas and sweetened beverages.  Goals: 1. Follow Plate Method as discussed. 2. Increase fresh fruits and vegetables. 3. Cut out sweets, junk  food, sodas, tea and chips, cookies, desserts. 4. Drink 8 glasses of water daily. 5. Cut out snacks between meals. 6. Exercise 30 minutes 5 days a week. 7. Lose 1-2 lbs per week til next visit. 8. Keep food journal daily. 9. Keep A1C below 5.7%.  Teaching Method Utilized:  Visual Auditory Hands on  Handouts given during visit include:  The Plate Method  Meal Plan Card  Weight loss tips  Diabetes instructions  Barriers to learning/adherence to lifestyle change: None  Demonstrated degree of understanding via:  Teach Back   Monitoring/Evaluation:  Dietary intake, exercise, meal planning, SBG, and body weight in 1 month(s).

## 2014-08-31 ENCOUNTER — Other Ambulatory Visit: Payer: Self-pay

## 2014-08-31 MED ORDER — AMPHETAMINE-DEXTROAMPHETAMINE 20 MG PO TABS: 20.0000 mg | ORAL_TABLET | Freq: Every day | ORAL | Status: DC

## 2014-09-27 ENCOUNTER — Ambulatory Visit: Payer: 59 | Admitting: Family Medicine

## 2014-09-27 ENCOUNTER — Encounter: Payer: Self-pay | Admitting: *Deleted

## 2014-10-06 ENCOUNTER — Telehealth: Payer: Self-pay | Admitting: Nutrition

## 2014-10-06 ENCOUNTER — Ambulatory Visit: Payer: 59 | Admitting: Nutrition

## 2014-10-06 NOTE — Telephone Encounter (Signed)
VM to call to reschedule cancelled appointment. PC

## 2014-11-16 ENCOUNTER — Other Ambulatory Visit: Payer: Self-pay

## 2014-11-16 MED ORDER — AMPHETAMINE-DEXTROAMPHETAMINE 20 MG PO TABS: 20.0000 mg | ORAL_TABLET | Freq: Every day | ORAL | Status: DC

## 2014-12-06 ENCOUNTER — Ambulatory Visit: Payer: 59 | Admitting: Family Medicine

## 2015-02-13 ENCOUNTER — Other Ambulatory Visit: Payer: Self-pay

## 2015-02-13 MED ORDER — AMPHETAMINE-DEXTROAMPHETAMINE 20 MG PO TABS: 20.0000 mg | ORAL_TABLET | Freq: Every day | ORAL | Status: DC

## 2015-03-28 ENCOUNTER — Other Ambulatory Visit: Payer: Self-pay

## 2015-03-28 DIAGNOSIS — Z1231 Encounter for screening mammogram for malignant neoplasm of breast: Secondary | ICD-10-CM

## 2015-04-02 ENCOUNTER — Telehealth: Payer: Self-pay

## 2015-04-02 DIAGNOSIS — E8881 Metabolic syndrome: Secondary | ICD-10-CM

## 2015-04-02 DIAGNOSIS — R7303 Prediabetes: Secondary | ICD-10-CM

## 2015-04-02 DIAGNOSIS — R030 Elevated blood-pressure reading, without diagnosis of hypertension: Secondary | ICD-10-CM

## 2015-04-02 DIAGNOSIS — Z13220 Encounter for screening for lipoid disorders: Secondary | ICD-10-CM

## 2015-04-02 NOTE — Telephone Encounter (Signed)
-----  Message from Fayrene Helper, MD sent at 04/02/2015 12:18 PM EST ----- Regarding: needs appt Pls call and sched and appt for her before March, pref in next 1 to 2 weeks  Flu vac sh can get without appt if she has not had one Let her know I would like to see her, has not been in for 1 year in March!   Fasting labs prior to visit if possible also please!  Carmen Mcintyre pls order cbc, lipid, cmp and EGFr, HBA1c and TSH, fasting

## 2015-04-10 DIAGNOSIS — R7309 Other abnormal glucose: Secondary | ICD-10-CM | POA: Diagnosis not present

## 2015-04-10 DIAGNOSIS — Z13220 Encounter for screening for lipoid disorders: Secondary | ICD-10-CM | POA: Diagnosis not present

## 2015-04-10 DIAGNOSIS — R030 Elevated blood-pressure reading, without diagnosis of hypertension: Secondary | ICD-10-CM | POA: Diagnosis not present

## 2015-04-10 DIAGNOSIS — R7303 Prediabetes: Secondary | ICD-10-CM | POA: Diagnosis not present

## 2015-04-10 DIAGNOSIS — E8881 Metabolic syndrome: Secondary | ICD-10-CM | POA: Diagnosis not present

## 2015-04-11 ENCOUNTER — Encounter: Payer: Self-pay | Admitting: Family Medicine

## 2015-04-11 ENCOUNTER — Ambulatory Visit (INDEPENDENT_AMBULATORY_CARE_PROVIDER_SITE_OTHER): Payer: 59 | Admitting: Family Medicine

## 2015-04-11 VITALS — BP 126/78 | HR 100 | Resp 18 | Ht 60.0 in | Wt 171.0 lb

## 2015-04-11 DIAGNOSIS — R7303 Prediabetes: Secondary | ICD-10-CM

## 2015-04-11 DIAGNOSIS — E669 Obesity, unspecified: Secondary | ICD-10-CM | POA: Diagnosis not present

## 2015-04-11 DIAGNOSIS — R5382 Chronic fatigue, unspecified: Secondary | ICD-10-CM

## 2015-04-11 DIAGNOSIS — Z23 Encounter for immunization: Secondary | ICD-10-CM

## 2015-04-11 DIAGNOSIS — G9332 Myalgic encephalomyelitis/chronic fatigue syndrome: Secondary | ICD-10-CM

## 2015-04-11 DIAGNOSIS — F411 Generalized anxiety disorder: Secondary | ICD-10-CM

## 2015-04-11 DIAGNOSIS — R0781 Pleurodynia: Secondary | ICD-10-CM | POA: Diagnosis not present

## 2015-04-11 DIAGNOSIS — J302 Other seasonal allergic rhinitis: Secondary | ICD-10-CM

## 2015-04-11 DIAGNOSIS — E66811 Obesity, class 1: Secondary | ICD-10-CM

## 2015-04-11 LAB — COMPLETE METABOLIC PANEL WITH GFR
ALT: 31 U/L — AB (ref 6–29)
AST: 27 U/L (ref 10–35)
Albumin: 4.6 g/dL (ref 3.6–5.1)
Alkaline Phosphatase: 76 U/L (ref 33–130)
BUN: 12 mg/dL (ref 7–25)
CHLORIDE: 96 mmol/L — AB (ref 98–110)
CO2: 28 mmol/L (ref 20–31)
CREATININE: 0.78 mg/dL (ref 0.50–1.05)
Calcium: 9.6 mg/dL (ref 8.6–10.4)
GFR, Est African American: >89 mL/min (ref >=60)
GFR, Est Non African American: 89 mL/min (ref >=60)
GLUCOSE: 100 mg/dL — AB (ref 65–99)
Potassium: 4.7 mmol/L (ref 3.5–5.3)
SODIUM: 137 mmol/L (ref 135–146)
Total Bilirubin: 0.5 mg/dL (ref 0.2–1.2)
Total Protein: 7.3 g/dL (ref 6.1–8.1)

## 2015-04-11 LAB — LIPID PANEL
CHOL/HDL RATIO: 3.6 ratio (ref <=5.0)
CHOLESTEROL: 177 mg/dL (ref 125–200)
HDL: 49 mg/dL (ref >=46)
LDL Cholesterol: 108 mg/dL (ref <130)
Triglycerides: 101 mg/dL (ref <150)
VLDL: 20 mg/dL (ref <30)

## 2015-04-11 LAB — CBC
HCT: 40.6 % (ref 36.0–46.0)
Hemoglobin: 13.7 g/dL (ref 12.0–15.0)
MCH: 30.4 pg (ref 26.0–34.0)
MCHC: 33.7 g/dL (ref 30.0–36.0)
MCV: 90.2 fL (ref 78.0–100.0)
MPV: 10.1 fL (ref 8.6–12.4)
PLATELETS: 285 10*3/uL (ref 150–400)
RBC: 4.5 MIL/uL (ref 3.87–5.11)
RDW: 13.8 % (ref 11.5–15.5)
WBC: 6 10*3/uL (ref 4.0–10.5)

## 2015-04-11 LAB — TSH: TSH: 1.017 u[IU]/mL (ref 0.350–4.500)

## 2015-04-11 LAB — HEMOGLOBIN A1C
HEMOGLOBIN A1C: 5.8 % — AB (ref <5.7)
Mean Plasma Glucose: 120 mg/dL — ABNORMAL HIGH (ref <117)

## 2015-04-11 MED ORDER — RANITIDINE HCL 300 MG PO TABS: 300.0000 mg | ORAL_TABLET | Freq: Every day | ORAL | Status: DC

## 2015-04-11 MED ORDER — IBUPROFEN 800 MG PO TABS: ORAL_TABLET | ORAL | Status: DC

## 2015-04-11 MED ORDER — PREDNISONE 5 MG PO TABS: 5.0000 mg | ORAL_TABLET | Freq: Two times a day (BID) | ORAL | Status: AC

## 2015-04-11 MED FILL — raNITIdine HCL 300 MG TABS: 300 | 14 days supply | Qty: 14 | Fill #0

## 2015-04-11 MED FILL — predniSONE 5 MG TABS: 5 | 5 days supply | Qty: 10 | Fill #0

## 2015-04-11 MED FILL — IBUPROFEN 800 MG TABLET: 800 | 7 days supply | Qty: 14 | Fill #0

## 2015-04-11 NOTE — Patient Instructions (Addendum)
F/u in 6 month, call if you need me before Blood work is good, just need to continue changed eating habits to lower blood sugar  Flu vaccine today  Aim to consistently eat between 12 to 1500 calories daily, over time this will help you to lose 2 to 3 pounds per month, which is a good and realistic goal for you  1 week course of medication sent for the chest  Pain you have wuith breathing , if this does not stop call or send a message you will be sent for a chest scan  Pls let me know who to refer you to for colonoscopy  All the best  Use flonase and zyrtec for your allergy symptoms  HBA1C in 5.5 monhts

## 2015-04-11 NOTE — Progress Notes (Signed)
Subjective:    Patient ID: Carmen Mcintyre, female    DOB: 01/22/1966, 50 y.o.   MRN: [id number]  HPI   Carmen Mcintyre     MRN: [id number]      DOB: 1965/07/10   HPI Carmen Mcintyre is here for follow up and re-evaluation of chronic medical conditions, medication management and review of any available recent lab and radiology data.  Preventive health is updated, specifically  Cancer screening and Immunization.   Questions or concerns regarding consultations or procedures which the PT has had in the interim are  Addressed.seen by nutrition educator, no weight loss success however, frustrated by slow rate of loss as compared to her spouse, eating habits as far as food choice has improved The PT denies any adverse reactions to current medications since the last visit 2 week h/p eft posterior chest pain aggravated by deep breaths  ROS Denies recent fever or chills. Denies sinus pressure, nasal congestion, ear pain or sore throat. Denies chest congestion, productive cough or wheezing. Denies  palpitations and leg swelling Denies abdominal pain, nausea, vomiting,diarrhea or constipation.   Denies dysuria, frequency, hesitancy or incontinence. Denies joint pain, swelling and limitation in mobility. Denies headaches, seizures, numbness, or tingling. Denies depression, anxiety or insomnia. Denies skin break down or rash.   PE  BP 126/78 mmHg  Pulse 100  Resp 18  Ht 5' (1.524 m)  Wt 171 lb (77.565 kg)  BMI 33.40 kg/m2  SpO2 97%  Patient alert and oriented and in no cardiopulmonary distress.  HEENT: No facial asymmetry, EOMI,   oropharynx pink and moist.  Neck supple no JVD, no mass.  Chest: Clear to auscultation bilaterally.Localized posterior left chest pain  CVS: S1, S2 no murmurs, no S3.Regular rate.  ABD: Soft non tender.   Ext: No edema  MS: Adequate ROM spine, shoulders, hips and knees. Localized pain in posterior chest when pt takes deep breaths  Skin: Intact, no  ulcerations or rash noted.  Psych: Good eye contact, normal affect. Memory intact not anxious or depressed appearing.  CNS: CN 2-12 intact, power,  normal throughout.no focal deficits noted.   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 Patient re-educated about  the importance of commitment to a  minimum of 150 minutes of exercise per week.  The importance of healthy food choices with portion control discussed. Encouraged to start a food diary, count calories and to consider  joining a support group. Sample diet sheets offered. Goals set by the patient for the next several months.   Weight /BMI 04/11/2015 08/10/2014 05/25/2014  WEIGHT 171 lb 164 lb 158 lb  HEIGHT 5\' 0"  5\' 0"  5\' 0"   BMI 33.4 kg/m2 32.03 kg/m2 30.86 kg/m2    Current exercise per week 90 minutes.   GAD (generalized anxiety disorder) Controlled, no change in medication   CHRONIC FATIGUE SYNDROME Controlled, no change in medication   Prediabetes Patient educated about the importance of limiting  Carbohydrate intake , the need to commit to daily physical activity for a minimum of 30 minutes , and to commit weight loss. The fact that changes in all these areas will reduce or eliminate all together the development of diabetes  is stressed.  Unchanged  Diabetic Labs Latest Ref Rng 04/10/2015 05/24/2014 01/26/2014 10/06/2012 11/13/2011  HbA1c <5.7 % 5.8(H) 5.8(H) 5.8(H) 5.3 5.5  Chol 125 - 200 mg/dL 177 - 160 167 149  HDL >=46 mg/dL 49 - 43 34(L) 44  Calc LDL <130 mg/dL 108 - 90 101(H) 76  Triglycerides <150 mg/dL 101 - 135 159(H) 144  Creatinine 0.50 - 1.05 mg/dL 0.78 0.75 0.79 0.80 0.71   BP/Weight 04/11/2015 08/10/2014  05/25/2014 01/27/2014 10/28/2013 09/13/2013 123XX123  Systolic BP 123XX123 - A999333 0000000 Q000111Q 0000000 [id number]  Diastolic BP 78 - 82 90 89 96 80  Wt. (Lbs) 171 164 158 163.4 150 152 162  BMI 33.4 32.03 30.86 31.91 29.3 29.69 31.11   No flowsheet data found.     Seasonal allergies Current symptom flare, advised to use medication regularly as prescribed  Controlled, no change in medication       Review of Systems     Objective:   Physical Exam        Assessment & Plan:

## 2015-04-16 ENCOUNTER — Ambulatory Visit
Admission: RE | Admit: 2015-04-16 | Discharge: 2015-04-16 | Disposition: A | Discharge status: Home / Self Care | Payer: 59 | Source: Ambulatory Visit

## 2015-04-16 DIAGNOSIS — Z1231 Encounter for screening mammogram for malignant neoplasm of breast: Secondary | ICD-10-CM

## 2015-04-20 NOTE — Assessment & Plan Note (Signed)
Patient educated about the importance of limiting  Carbohydrate intake , the need to commit to daily physical activity for a minimum of 30 minutes , and to commit weight loss. The fact that changes in all these areas will reduce or eliminate all together the development of diabetes is stressed.  Unchanged  Diabetic Labs Latest Ref Rng 04/10/2015 05/24/2014 01/26/2014 10/06/2012 11/13/2011  HbA1c <5.7 % 5.8(H) 5.8(H) 5.8(H) 5.3 5.5  Chol 125 - 200 mg/dL 177 - 160 167 149  HDL >=46 mg/dL 49 - 43 34(L) 44  Calc LDL <130 mg/dL 108 - 90 101(H) 76  Triglycerides <150 mg/dL 101 - 135 159(H) 144  Creatinine 0.50 - 1.05 mg/dL 0.78 0.75 0.79 0.80 0.71   BP/Weight 04/11/2015 08/10/2014 05/25/2014 01/27/2014 10/28/2013 09/13/2013 [id number]  Systolic BP 123XX123 - A999333 0000000 Q000111Q 0000000 123456  Diastolic BP 78 - 82 90 89 96 80  Wt. (Lbs) 171 164 158 163.4 150 152 162  BMI 33.4 32.03 30.86 31.91 29.3 29.69 31.11   No flowsheet data found.

## 2015-04-20 NOTE — Assessment & Plan Note (Addendum)
Current symptom flare, advised to use medication regularly as prescribed  Controlled, no change in medication

## 2015-04-20 NOTE — Assessment & Plan Note (Signed)
Deteriorated. Patient re-educated about  the importance of commitment to a  minimum of 150 minutes of exercise per week.  The importance of healthy food choices with portion control discussed. Encouraged to start a food diary, count calories and to consider  joining a support group. Sample diet sheets offered. Goals set by the patient for the next several months.   Weight /BMI 04/11/2015 08/10/2014 05/25/2014  WEIGHT 171 lb 164 lb 158 lb  HEIGHT 5\' 0"  5\' 0"  5\' 0"   BMI 33.4 kg/m2 32.03 kg/m2 30.86 kg/m2    Current exercise per week 90 minutes.

## 2015-04-20 NOTE — Assessment & Plan Note (Signed)
Controlled, no change in medication  

## 2015-04-20 NOTE — Assessment & Plan Note (Signed)
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

## 2015-04-25 ENCOUNTER — Ambulatory Visit: Payer: 59

## 2015-05-25 MED FILL — VENLAFAXINE HCL ER 37.5 MG: 37.5 | 90 days supply | Qty: 90 | Fill #4

## 2015-06-14 ENCOUNTER — Other Ambulatory Visit: Payer: Self-pay

## 2015-06-14 MED ORDER — AMPHETAMINE-DEXTROAMPHETAMINE 20 MG PO TABS: 20.0000 mg | ORAL_TABLET | Freq: Every day | ORAL | Status: DC

## 2015-06-18 MED FILL — AMPHETAMINE SALTS 20 MG TAB: 20 | 90 days supply | Qty: 90 | Fill #0

## 2015-07-18 ENCOUNTER — Encounter: Payer: Self-pay | Admitting: Family Medicine

## 2015-07-18 ENCOUNTER — Other Ambulatory Visit: Payer: Self-pay | Admitting: Family Medicine

## 2015-07-18 MED ORDER — PHENTERMINE HCL 37.5 MG PO TABS: 37.5000 mg | ORAL_TABLET | Freq: Every day | ORAL | Status: DC

## 2015-07-19 MED FILL — PHENTERMINE 37.5 MG TABLET: 37.5 | 30 days supply | Qty: 30 | Fill #0

## 2015-08-15 ENCOUNTER — Other Ambulatory Visit: Payer: Self-pay | Admitting: Family Medicine

## 2015-08-15 MED FILL — VENLAFAXINE HCL ER 37.5 MG: 37.5 | 90 days supply | Qty: 90 | Fill #0

## 2015-09-20 ENCOUNTER — Other Ambulatory Visit: Payer: Self-pay

## 2015-09-20 ENCOUNTER — Other Ambulatory Visit: Payer: Self-pay | Admitting: Family Medicine

## 2015-09-20 MED ORDER — AMPHETAMINE-DEXTROAMPHETAMINE 20 MG PO TABS: 20.0000 mg | ORAL_TABLET | Freq: Every day | ORAL | Status: DC

## 2015-09-26 DIAGNOSIS — H5203 Hypermetropia, bilateral: Secondary | ICD-10-CM | POA: Diagnosis not present

## 2015-09-26 MED FILL — DEXTROAMP-AMPHETAMIN 20 MG: 20 | 90 days supply | Qty: 90 | Fill #0

## 2015-10-22 ENCOUNTER — Ambulatory Visit: Payer: Self-pay | Admitting: Family Medicine

## 2015-11-19 ENCOUNTER — Other Ambulatory Visit: Payer: Self-pay | Admitting: Family Medicine

## 2015-11-21 MED FILL — VENLAFAXINE HCL ER 37.5 MG: 37.5 | 90 days supply | Qty: 90 | Fill #1

## 2015-11-27 ENCOUNTER — Ambulatory Visit (INDEPENDENT_AMBULATORY_CARE_PROVIDER_SITE_OTHER): Payer: 59 | Admitting: Family Medicine

## 2015-11-27 ENCOUNTER — Encounter: Payer: Self-pay | Admitting: Family Medicine

## 2015-11-27 VITALS — BP 120/82 | HR 101 | Temp 98.3°F | Resp 16 | Ht 60.0 in | Wt 170.0 lb

## 2015-11-27 DIAGNOSIS — Z13220 Encounter for screening for lipoid disorders: Secondary | ICD-10-CM

## 2015-11-27 DIAGNOSIS — Z114 Encounter for screening for human immunodeficiency virus [HIV]: Secondary | ICD-10-CM | POA: Diagnosis not present

## 2015-11-27 DIAGNOSIS — G9332 Myalgic encephalomyelitis/chronic fatigue syndrome: Secondary | ICD-10-CM

## 2015-11-27 DIAGNOSIS — Z1211 Encounter for screening for malignant neoplasm of colon: Secondary | ICD-10-CM

## 2015-11-27 DIAGNOSIS — R7303 Prediabetes: Secondary | ICD-10-CM

## 2015-11-27 DIAGNOSIS — J069 Acute upper respiratory infection, unspecified: Secondary | ICD-10-CM | POA: Insufficient documentation

## 2015-11-27 DIAGNOSIS — R030 Elevated blood-pressure reading, without diagnosis of hypertension: Secondary | ICD-10-CM

## 2015-11-27 DIAGNOSIS — R5382 Chronic fatigue, unspecified: Secondary | ICD-10-CM

## 2015-11-27 DIAGNOSIS — E66811 Obesity, class 1: Secondary | ICD-10-CM

## 2015-11-27 DIAGNOSIS — E669 Obesity, unspecified: Secondary | ICD-10-CM

## 2015-11-27 DIAGNOSIS — B9789 Other viral agents as the cause of diseases classified elsewhere: Secondary | ICD-10-CM

## 2015-11-27 DIAGNOSIS — F411 Generalized anxiety disorder: Secondary | ICD-10-CM

## 2015-11-27 NOTE — Assessment & Plan Note (Signed)
Good response to current medication, continue same 

## 2015-11-27 NOTE — Assessment & Plan Note (Signed)
Normal blood pressure at last several visit which is great DASH diet and commitment to daily physical activity for a minimum of 30 minutes discussed and encouraged, as a part of hypertension management. The importance of attaining a healthy weight is also discussed.  BP/Weight 11/27/2015 04/11/2015 08/10/2014 05/25/2014 01/27/2014 [id number]  Systolic BP 123456 123XX123 - A999333 0000000 Q000111Q 0000000  Diastolic BP 82 78 - 82 90 89 96  Wt. (Lbs) 170 171 164 158 163.4 150 152  BMI 33.2 33.4 32.03 30.86 31.91 29.3 29.69

## 2015-11-27 NOTE — Assessment & Plan Note (Signed)
unchanged Patient re-educated about  the importance of commitment to a  minimum of 150 minutes of exercise per week.  The importance of healthy food choices with portion control discussed. Encouraged to start a food diary, count calories and to consider  joining a support group. Sample diet sheets offered. Goals set by the patient for the next several months.   Weight /BMI 11/27/2015 04/11/2015 08/10/2014  WEIGHT 170 lb 171 lb 164 lb  HEIGHT 5\' 0"  5\' 0"  5\' 0"   BMI 33.2 kg/m2 33.4 kg/m2 32.03 kg/m2

## 2015-11-27 NOTE — Progress Notes (Signed)
Carmen Mcintyre     MRN: [id number]      DOB: 03-23-1965   HPI Carmen Mcintyre is here for follow up and re-evaluation of chronic medical conditions, medication management and review of any available recent lab and radiology data.  Preventive health is updated, specifically  Cancer screening and Immunization. Needs colonoscopy and pap, will get both before year end  Questions or concerns regarding consultations or procedures which the PT has had in the interim are  addressed. The PT denies any adverse reactions to current medications since the last visit.  4 day h/o sinus pressure, drainage, intermittent scratchy throat, and cough sometimes productive of sputum, has had intermittent chills Still does not "enjoy" or commit to regular exercise, but working on it! ROS  Denies chest pains, palpitations and leg swelling Denies abdominal pain, nausea, vomiting,diarrhea or constipation.   Denies dysuria, frequency, hesitancy or incontinence. Denies joint pain, swelling and limitation in mobility. Denies headaches, seizures, numbness, or tingling. Denies depression, anxiety or insomnia. Denies skin break down or rash.   PE  BP 120/82   Pulse (!) 101   Temp 98.3 F (36.8 C) (Oral)   Resp 16   Ht 5' (1.524 m)   Wt 170 lb (77.1 kg)   SpO2 96%   BMI 33.20 kg/m   Patient alert and oriented and in no cardiopulmonary distress.  HEENT: No facial asymmetry, EOMI,   oropharynx pink and moist.  Neck supple no JVD, no mass.Mild left maxillary sinus tenderness, and anterior cervical adenitis  Chest: Clear to auscultation bilaterally.  CVS: S1, S2 no murmurs, no S3.Regular rate.  ABD: Soft non tender.   Ext: No edema  MS: Adequate ROM spine, shoulders, hips and knees.  Skin: Intact, no ulcerations or rash noted.  Psych: Good eye contact, normal affect. Memory intact not anxious or depressed appearing.  CNS: CN 2-12 intact, power,  normal throughout.no focal deficits noted.   Assessment &  Plan  CHRONIC FATIGUE SYNDROME Good response to current medication, continue same  Prediabetes Patient educated about the importance of limiting  Carbohydrate intake , the need to commit to daily physical activity for a minimum of 30 minutes , and to commit weight loss. The fact that changes in all these areas will reduce or eliminate all together the development of diabetes is stressed.  Updated lab needed at/ before next visit.   Diabetic Labs Latest Ref Rng & Units 04/10/2015 05/24/2014 01/26/2014 10/06/2012 11/13/2011  HbA1c <5.7 % 5.8(H) 5.8(H) 5.8(H) 5.3 5.5  Chol 125 - 200 mg/dL 177 - 160 167 149  HDL >=46 mg/dL 49 - 43 34(L) 44  Calc LDL <130 mg/dL 108 - 90 101(H) 76  Triglycerides <150 mg/dL 101 - 135 159(H) 144  Creatinine 0.50 - 1.05 mg/dL 0.78 0.75 0.79 0.80 0.71   BP/Weight 11/27/2015 04/11/2015 08/10/2014 05/25/2014 01/27/2014 [id number]  Systolic BP 123456 123XX123 - A999333 0000000 Q000111Q 0000000  Diastolic BP 82 78 - 82 90 89 96  Wt. (Lbs) 170 171 164 158 163.4 150 152  BMI 33.2 33.4 32.03 30.86 31.91 29.3 29.69   No flowsheet data found.    Obesity (BMI 30.0-34.9) unchanged Patient re-educated about  the importance of commitment to a  minimum of 150 minutes of exercise per week.  The importance of healthy food choices with portion control discussed. Encouraged to start a food diary, count calories and to consider  joining a support group. Sample diet sheets offered. Goals set by the patient for  the next several months.   Weight /BMI 11/27/2015 04/11/2015 08/10/2014  WEIGHT 170 lb 171 lb 164 lb  HEIGHT 5\' 0"  5\' 0"  5\' 0"   BMI 33.2 kg/m2 33.4 kg/m2 32.03 kg/m2      GAD (generalized anxiety disorder) Controlled, no change in medication   Viral URI with cough No s/s of bacterial superinfection, symptomatic treatment , call in 5 to 7 days if worsens, anticipate total clearing in 10 days, she is to get flu vaccine after that  Prehypertension Normal blood pressure at last several  visit which is great DASH diet and commitment to daily physical activity for a minimum of 30 minutes discussed and encouraged, as a part of hypertension management. The importance of attaining a healthy weight is also discussed.  BP/Weight 11/27/2015 04/11/2015 08/10/2014 05/25/2014 01/27/2014 [id number]  Systolic BP 123456 123XX123 - A999333 0000000 Q000111Q 0000000  Diastolic BP 82 78 - 82 90 89 96  Wt. (Lbs) 170 171 164 158 163.4 150 152  BMI 33.2 33.4 32.03 30.86 31.91 29.3 29.69

## 2015-11-27 NOTE — Assessment & Plan Note (Signed)
Patient educated about the importance of limiting  Carbohydrate intake , the need to commit to daily physical activity for a minimum of 30 minutes , and to commit weight loss. The fact that changes in all these areas will reduce or eliminate all together the development of diabetes is stressed.  Updated lab needed at/ before next visit.   Diabetic Labs Latest Ref Rng & Units 04/10/2015 05/24/2014 01/26/2014 10/06/2012 11/13/2011  HbA1c <5.7 % 5.8(H) 5.8(H) 5.8(H) 5.3 5.5  Chol 125 - 200 mg/dL 177 - 160 167 149  HDL >=46 mg/dL 49 - 43 34(L) 44  Calc LDL <130 mg/dL 108 - 90 101(H) 76  Triglycerides <150 mg/dL 101 - 135 159(H) 144  Creatinine 0.50 - 1.05 mg/dL 0.78 0.75 0.79 0.80 0.71   BP/Weight 11/27/2015 04/11/2015 08/10/2014 05/25/2014 01/27/2014 [id number]  Systolic BP 123456 123XX123 - A999333 0000000 Q000111Q 0000000  Diastolic BP 82 78 - 82 90 89 96  Wt. (Lbs) 170 171 164 158 163.4 150 152  BMI 33.2 33.4 32.03 30.86 31.91 29.3 29.69   No flowsheet data found.

## 2015-11-27 NOTE — Patient Instructions (Addendum)
F/u feb 3 or shortly after, call if you need me sooner  Please get flu vaccine once respiratory symptoms are cleared  Call if chest congestion and sinus symptoms worse, they should resolve in next 5 to 7 days. Call if you develop fever or chills   Fasting labs Jan 31 or shortly after but at least 4 days before visit Pls commit to plant based eating and 30 mins of movement every day for health, and try to actually enjoy these changes, that helps!   Use mucinex and drink at least 60 ounces water every day to help with congestion  You are referred to Dr Salley Slaughter for colonoscopy  Thank you  for choosing [facility]. We consider it a privelige to serve you.  Delivering excellent health care in a caring and  compassionate way is our goal.  Partnering with you,  so that together we can achieve this goal is our strategy.

## 2015-11-27 NOTE — Assessment & Plan Note (Signed)
Controlled, no change in medication  

## 2015-11-27 NOTE — Assessment & Plan Note (Addendum)
No s/s of bacterial superinfection, symptomatic treatment , call in 5 to 7 days if worsens, anticipate total clearing in 10 days, she is to get flu vaccine after that

## 2015-11-28 ENCOUNTER — Encounter (INDEPENDENT_AMBULATORY_CARE_PROVIDER_SITE_OTHER): Payer: Self-pay | Admitting: *Deleted

## 2015-11-28 ENCOUNTER — Ambulatory Visit: Payer: 59 | Admitting: Family Medicine

## 2015-12-12 ENCOUNTER — Ambulatory Visit (INDEPENDENT_AMBULATORY_CARE_PROVIDER_SITE_OTHER): Payer: 59 | Admitting: Advanced Practice Midwife

## 2015-12-12 ENCOUNTER — Encounter: Payer: Self-pay | Admitting: Advanced Practice Midwife

## 2015-12-12 ENCOUNTER — Other Ambulatory Visit (HOSPITAL_COMMUNITY)
Admission: RE | Admit: 2015-12-12 | Discharge: 2015-12-12 | Disposition: A | Discharge status: Home / Self Care | Payer: 59 | Source: Ambulatory Visit | Attending: Advanced Practice Midwife | Admitting: Advanced Practice Midwife

## 2015-12-12 VITALS — BP 128/80 | HR 92 | Ht 61.0 in | Wt 173.0 lb

## 2015-12-12 DIAGNOSIS — Z01419 Encounter for gynecological examination (general) (routine) without abnormal findings: Secondary | ICD-10-CM | POA: Insufficient documentation

## 2015-12-12 DIAGNOSIS — Z1212 Encounter for screening for malignant neoplasm of rectum: Secondary | ICD-10-CM

## 2015-12-12 DIAGNOSIS — Z1151 Encounter for screening for human papillomavirus (HPV): Secondary | ICD-10-CM | POA: Insufficient documentation

## 2015-12-12 NOTE — Progress Notes (Signed)
Alazae K Roorda 50 y.o.  Vitals:   12/12/15 0915  BP: 128/80  Pulse: 92     Filed Weights   12/12/15 0915  Weight: 173 lb (78.5 kg)    Past Medical History: Past Medical History:  Diagnosis Date  . Dyslipidemia   . Insomnia   . Urinary incontinence     Past Surgical History: Past Surgical History:  Procedure Laterality Date  . CAESAREAN  1997/2003  . CHOLECYSTECTOMY  2005  . LEFT BREAST MASS REMOVED  1998  . RT LOWER LOBECTOMY SECONDARY IN INFECTION  2006    Family History: Family History  Problem Relation Age of Onset  . Diabetes Mother   . Arthritis      Social History: Social History  Substance Use Topics  . Smoking status: Never Smoker  . Smokeless tobacco: Never Used  . Alcohol use Yes     Comment: occ. wine    Allergies:  Allergies  Allergen Reactions  . Hydrocodone     nausea      Current Outpatient Prescriptions:  .  amphetamine-dextroamphetamine (ADDERALL) 20 MG tablet, Take 1 tablet (20 mg total) by mouth daily., Disp: 90 tablet, Rfl: 0 .  calcium-vitamin D 250-100 MG-UNIT tablet, Take 1 tablet by mouth 2 (two) times daily., Disp: , Rfl:  .  cetirizine (ZYRTEC HIVES RELIEF) 10 MG tablet, Take 10 mg by mouth daily.  , Disp: , Rfl:  .  Multiple Vitamins-Minerals (MULTIVITAMIN WITH MINERALS) tablet, Take 1 tablet by mouth daily., Disp: , Rfl:  .  venlafaxine XR (EFFEXOR-XR) 37.5 MG 24 hr capsule, TAKE 1 CAPSULE BY MOUTH ONCE DAILY *DISCONTINUE SERTRALINE*, Disp: 90 capsule, Rfl: PRN  History of Present Illness: here for pap.  Last pap 2014, no hx abnormals.  Mammogram 2/17 normal.  Plans colonoscopy next month.  Dr. Rosezena Sensor is PCP.  Is "prediabetic"--A1C 5.8. C/O increasing hot flashes. Was placed on Effexor 37.5mg  "year ago" for some depression and vasomotor sx.  Thinks it worked well up until now.  Still has periods.    Review of Systems   Patient denies any headaches, blurred vision, shortness of breath, chest pain, abdominal pain, problems  with bowel movements, urination, or intercourse.   Physical Exam: General:  Well developed, well nourished, no acute distress Skin:  Warm and dry Neck:  Midline trachea, normal thyroid Lungs; Clear to auscultation bilaterally Breast:  No dominant palpable mass, retraction, or nipple discharge Cardiovascular: Regular rate and rhythm Abdomen:  Soft, non tender, no hepatosplenomegaly Pelvic:  External genitalia is normal in appearance.  The vagina is normal in appearance.  The cervix is bulbous.  Uterus is felt to be normal size, shape, and contour.  No adnexal masses or tenderness noted. Exam limited by body habitus Extremities:  No swelling or varicosities noted Rectal- hemocult neg Psych:  No mood changes.     Impression:GYN exam Vasomotor sx     Plan: pap q 3 years if normal Pycnogenol 100mg  (may increase to 150-200 mg prn) for hot flashes/pre-diabetes Effexor dose is very low dose; could also increase dosage if pycnogenol doesn't help vasomotor sx

## 2015-12-12 NOTE — Patient Instructions (Addendum)
Pycnogenol: start with 50mg /day for a week, then increase to 100mg /day (may take at one time or in divided doses, twice a day).

## 2015-12-14 LAB — CYTOLOGY - PAP

## 2015-12-18 ENCOUNTER — Other Ambulatory Visit: Payer: Self-pay

## 2015-12-18 MED ORDER — AMPHETAMINE-DEXTROAMPHETAMINE 20 MG PO TABS: 20.0000 mg | ORAL_TABLET | Freq: Every day | ORAL | 0 refills | Status: DC

## 2015-12-19 MED FILL — DEXTROAMP-AMPHETAMIN 20 MG: 20 | 90 days supply | Qty: 90 | Fill #0

## 2016-02-18 ENCOUNTER — Telehealth: Payer: Self-pay | Admitting: Advanced Practice Midwife

## 2016-02-18 NOTE — Telephone Encounter (Signed)
LMOVM that Carmen Mcintyre is out of the office until Wednesday, message about dose increase will be forwarded to [place].

## 2016-02-20 ENCOUNTER — Telehealth: Payer: Self-pay | Admitting: Advanced Practice Midwife

## 2016-02-21 ENCOUNTER — Telehealth: Payer: Self-pay | Admitting: *Deleted

## 2016-02-21 ENCOUNTER — Encounter: Payer: Self-pay | Admitting: Family Medicine

## 2016-02-21 ENCOUNTER — Encounter: Payer: Self-pay | Admitting: Advanced Practice Midwife

## 2016-02-21 MED ORDER — ALPRAZOLAM 0.25 MG PO TABS: 0.2500 mg | ORAL_TABLET | Freq: Two times a day (BID) | ORAL | 0 refills | Status: DC | PRN

## 2016-02-21 NOTE — Telephone Encounter (Signed)
I called in a 90 day supply

## 2016-02-22 ENCOUNTER — Other Ambulatory Visit: Payer: Self-pay

## 2016-02-22 MED ORDER — ALPRAZOLAM 0.25 MG PO TABS: 0.2500 mg | ORAL_TABLET | Freq: Two times a day (BID) | ORAL | 0 refills | Status: DC | PRN

## 2016-03-07 ENCOUNTER — Other Ambulatory Visit: Payer: Self-pay

## 2016-03-07 DIAGNOSIS — Z114 Encounter for screening for human immunodeficiency virus [HIV]: Secondary | ICD-10-CM | POA: Diagnosis not present

## 2016-03-07 DIAGNOSIS — Z13220 Encounter for screening for lipoid disorders: Secondary | ICD-10-CM | POA: Diagnosis not present

## 2016-03-07 DIAGNOSIS — R7303 Prediabetes: Secondary | ICD-10-CM | POA: Diagnosis not present

## 2016-03-07 DIAGNOSIS — R030 Elevated blood-pressure reading, without diagnosis of hypertension: Secondary | ICD-10-CM | POA: Diagnosis not present

## 2016-03-07 LAB — CBC WITH DIFFERENTIAL/PLATELET
Basophils Absolute: 63 cells/uL (ref 0–200)
Basophils Relative: 1 %
Eosinophils Absolute: 126 cells/uL (ref 15–500)
Eosinophils Relative: 2 %
HEMATOCRIT: 43.5 % (ref 35.0–45.0)
Hemoglobin: 14.6 g/dL (ref 11.7–15.5)
LYMPHS PCT: 31 %
Lymphs Abs: 1953 cells/uL (ref 850–3900)
MCH: 31.1 pg (ref 27.0–33.0)
MCHC: 33.6 g/dL (ref 32.0–36.0)
MCV: 92.8 fL (ref 80.0–100.0)
MONO ABS: 441 {cells}/uL (ref 200–950)
MONOS PCT: 7 %
MPV: 10.5 fL (ref 7.5–12.5)
Neutro Abs: 3717 cells/uL (ref 1500–7800)
Neutrophils Relative %: 59 %
Platelets: 263 10*3/uL (ref 140–400)
RBC: 4.69 MIL/uL (ref 3.80–5.10)
RDW: 13 % (ref 11.0–15.0)
WBC: 6.3 10*3/uL (ref 3.8–10.8)

## 2016-03-07 LAB — COMPLETE METABOLIC PANEL WITH GFR
ALT: 48 U/L — AB (ref 6–29)
AST: 30 U/L (ref 10–35)
Albumin: 4.5 g/dL (ref 3.6–5.1)
Alkaline Phosphatase: 89 U/L (ref 33–130)
BILIRUBIN TOTAL: 0.5 mg/dL (ref 0.2–1.2)
BUN: 15 mg/dL (ref 7–25)
CO2: 27 mmol/L (ref 20–31)
CREATININE: 0.76 mg/dL (ref 0.50–1.05)
Calcium: 9.4 mg/dL (ref 8.6–10.4)
Chloride: 101 mmol/L (ref 98–110)
GFR, Est Non African American: >89 mL/min (ref >=60)
GLUCOSE: 128 mg/dL — AB (ref 65–99)
Potassium: 4.4 mmol/L (ref 3.5–5.3)
Sodium: 138 mmol/L (ref 135–146)
TOTAL PROTEIN: 7.3 g/dL (ref 6.1–8.1)

## 2016-03-07 LAB — LIPID PANEL
Cholesterol: 200 mg/dL — ABNORMAL HIGH (ref <200)
HDL: 45 mg/dL — ABNORMAL LOW (ref >50)
LDL Cholesterol: 114 mg/dL — ABNORMAL HIGH (ref <100)
TRIGLYCERIDES: 206 mg/dL — AB (ref <150)
Total CHOL/HDL Ratio: 4.4 Ratio (ref <5.0)
VLDL: 41 mg/dL — ABNORMAL HIGH (ref <30)

## 2016-03-07 LAB — TSH: TSH: 1.43 mIU/L

## 2016-03-07 LAB — HIV ANTIBODY (ROUTINE TESTING W REFLEX): HIV 1&2 Ab, 4th Generation: NONREACTIVE

## 2016-03-07 MED ORDER — AMPHETAMINE-DEXTROAMPHETAMINE 20 MG PO TABS: 20.0000 mg | ORAL_TABLET | Freq: Every day | ORAL | 0 refills | Status: DC

## 2016-03-08 LAB — HEMOGLOBIN A1C
Hgb A1c MFr Bld: 5.8 % — ABNORMAL HIGH (ref <5.7)
Mean Plasma Glucose: 120 mg/dL

## 2016-03-18 MED FILL — AMPHETAMINE SALTS 20 MG TAB: 20 | 90 days supply | Qty: 90 | Fill #0

## 2016-03-25 MED FILL — VENLAFAXINE HCL ER 75 MG CA: 75 | 90 days supply | Qty: 90 | Fill #0

## 2016-04-03 ENCOUNTER — Encounter: Payer: Self-pay | Admitting: Advanced Practice Midwife

## 2016-04-08 ENCOUNTER — Encounter: Payer: Self-pay | Admitting: Advanced Practice Midwife

## 2016-04-15 ENCOUNTER — Ambulatory Visit (INDEPENDENT_AMBULATORY_CARE_PROVIDER_SITE_OTHER): Payer: 59 | Admitting: Family Medicine

## 2016-04-15 ENCOUNTER — Encounter: Payer: Self-pay | Admitting: Family Medicine

## 2016-04-15 VITALS — BP 138/82 | HR 104 | Resp 15 | Ht 61.0 in | Wt 174.0 lb

## 2016-04-15 DIAGNOSIS — E785 Hyperlipidemia, unspecified: Secondary | ICD-10-CM

## 2016-04-15 DIAGNOSIS — R7303 Prediabetes: Secondary | ICD-10-CM

## 2016-04-15 DIAGNOSIS — Z23 Encounter for immunization: Secondary | ICD-10-CM

## 2016-04-15 DIAGNOSIS — R5382 Chronic fatigue, unspecified: Secondary | ICD-10-CM

## 2016-04-15 DIAGNOSIS — E8881 Metabolic syndrome: Secondary | ICD-10-CM

## 2016-04-15 DIAGNOSIS — E66811 Obesity, class 1: Secondary | ICD-10-CM

## 2016-04-15 DIAGNOSIS — G9332 Myalgic encephalomyelitis/chronic fatigue syndrome: Secondary | ICD-10-CM

## 2016-04-15 DIAGNOSIS — R030 Elevated blood-pressure reading, without diagnosis of hypertension: Secondary | ICD-10-CM

## 2016-04-15 DIAGNOSIS — R232 Flushing: Secondary | ICD-10-CM | POA: Diagnosis not present

## 2016-04-15 DIAGNOSIS — E669 Obesity, unspecified: Secondary | ICD-10-CM

## 2016-04-15 MED ORDER — AMPHETAMINE-DEXTROAMPHETAMINE 20 MG PO TABS: 20.0000 mg | ORAL_TABLET | Freq: Every day | ORAL | 0 refills | Status: DC

## 2016-04-15 MED ORDER — PHENTERMINE HCL 37.5 MG PO TABS: 37.5000 mg | ORAL_TABLET | Freq: Every day | ORAL | 0 refills | Status: DC

## 2016-04-15 MED FILL — PHENTERMINE 37.5 MG TABLET: 37.5 | 90 days supply | Qty: 90 | Fill #0

## 2016-04-15 NOTE — Assessment & Plan Note (Signed)
DASH diet and commitment to daily physical activity for a minimum of 30 minutes discussed and encouraged, as a part of hypertension management. The importance of attaining a healthy weight is also discussed.  BP/Weight 04/15/2016 12/12/2015 11/27/2015 04/11/2015 08/10/2014 05/25/2014 [id number]  Systolic BP 0000000 0000000 [id number] - [id number] 0000000  Diastolic BP 82 80 82 78 - 82 90  Wt. (Lbs) 174 173 170 171 164 158 163.4  BMI 32.88 32.69 33.2 33.4 32.03 30.86 31.91

## 2016-04-15 NOTE — Patient Instructions (Addendum)
F/u in 3 month, call if you need me sooner   Pls schedule and get colonoscopy and mammogram  Plant based eating and 30 mins of physical activity is very important for good health   Start hALF phentermine once daily, weight loss of 3 pounds per month to 4 is ideal    Book will be provided , name       Perimenopause Perimenopause is the time when your body begins to move into the menopause (no menstrual period for 12 straight months). It is a natural process. Perimenopause can begin 2-8 years before the menopause and usually lasts for 1 year after the menopause. During this time, your ovaries may or may not produce an egg. The ovaries vary in their production of estrogen and progesterone hormones each month. This can cause irregular menstrual periods, difficulty getting pregnant, vaginal bleeding between periods, and uncomfortable symptoms. CAUSES  Irregular production of the ovarian hormones, estrogen and progesterone, and not ovulating every month.  Other causes include:  Tumor of the pituitary gland in the brain.  Medical disease that affects the ovaries.  Radiation treatment.  Chemotherapy.  Unknown causes.  Heavy smoking and excessive alcohol intake can bring on perimenopause sooner. SIGNS AND SYMPTOMS   Hot flashes.  Night sweats.  Irregular menstrual periods.  Decreased sex drive.  Vaginal dryness.  Headaches.  Mood swings.  Depression.  Memory problems.  Irritability.  Tiredness.  Weight gain.  Trouble getting pregnant.  The beginning of losing bone cells (osteoporosis).  The beginning of hardening of the arteries (atherosclerosis). DIAGNOSIS  Your health care provider will make a diagnosis by analyzing your age, menstrual history, and symptoms. He or she will do a physical exam and note any changes in your body, especially your female organs. Female hormone tests may or may not be helpful depending on the amount of female hormones you  produce and when you produce them. However, other hormone tests may be helpful to rule out other problems. TREATMENT  In some cases, no treatment is needed. The decision on whether treatment is necessary during the perimenopause should be made by you and your health care provider based on how the symptoms are affecting you and your lifestyle. Various treatments are available, such as:  Treating individual symptoms with a specific medicine for that symptom.  Herbal medicines that can help specific symptoms.  Counseling.  Group therapy. HOME CARE INSTRUCTIONS   Keep track of your menstrual periods (when they occur, how heavy they are, how long between periods, and how long they last) as well as your symptoms and when they started.  Only take over-the-counter or prescription medicines as directed by your health care provider.  Sleep and rest.  Exercise.  Eat a diet that contains calcium (good for your bones) and soy (acts like the estrogen hormone).  Do not smoke.  Avoid alcoholic beverages.  Take vitamin supplements as recommended by your health care provider. Taking vitamin E may help in certain cases.  Take calcium and vitamin D supplements to help prevent bone loss.  Group therapy is sometimes helpful.  Acupuncture may help in some cases. SEEK MEDICAL CARE IF:   You have questions about any symptoms you are having.  You need a referral to a specialist (gynecologist, psychiatrist, or psychologist). SEEK IMMEDIATE MEDICAL CARE IF:   You have vaginal bleeding.  Your period lasts longer than 8 days.  Your periods are recurring sooner than 21 days.  You have bleeding after intercourse.  You have  severe depression.  You have pain when you urinate.  You have severe headaches.  You have vision problems. This information is not intended to replace advice given to you by your health care provider. Make sure you discuss any questions you have with your health care  provider. Document Released: 04/03/2004 Document Revised: 03/17/2014 Document Reviewed: 09/23/2012 Elsevier Interactive Patient Education  2017 [employer].

## 2016-04-15 NOTE — Assessment & Plan Note (Signed)
Hyperlipidemia:Low fat diet discussed and encouraged.   Lipid Panel  Lab Results  Component Value Date   CHOL 200 (H) 03/07/2016   HDL 45 (L) 03/07/2016   LDLCALC 114 (H) 03/07/2016   TRIG 206 (H) 03/07/2016   CHOLHDL 4.4 03/07/2016   deteriorated

## 2016-04-15 NOTE — Assessment & Plan Note (Signed)
The increased risk of cardiovascular disease associated with this diagnosis, and the need to consistently work on lifestyle to change this is discussed. Following  a  heart healthy diet ,commitment to 30 minutes of exercise at least 5 days per week, as well as control of blood sugar and cholesterol , and achieving a healthy weight are all the areas to be addressed .  

## 2016-04-15 NOTE — Progress Notes (Signed)
Carmen Mcintyre     MRN: [id number]      DOB: Jan 20, 1966   HPI Carmen Mcintyre is here for follow up and re-evaluation of chronic medical conditions, medication management and review of any available recent lab and radiology data.  Preventive health is updated, specifically  Cancer screening and Immunization.    The PT denies any adverse reactions to current medications since the last visit.  There are no new concerns.  There are no specific complaints   ROS Denies recent fever or chills. Denies sinus pressure, nasal congestion, ear pain or sore throat. Denies chest congestion, productive cough or wheezing. Denies chest pains, palpitations and leg swelling Denies abdominal pain, nausea, vomiting,diarrhea or constipation.   Denies dysuria, frequency, hesitancy or incontinence. Denies joint pain, swelling and limitation in mobility. Denies headaches, seizures, numbness, or tingling. Denies depression, anxiety or insomnia. Denies skin break down or rash.   PE  BP 138/82   Pulse (!) 104   Resp 15   Ht 5\' 1"  (1.549 m)   Wt 174 lb (78.9 kg)   SpO2 99%   BMI 32.88 kg/m   Patient alert and oriented and in no cardiopulmonary distress.  HEENT: No facial asymmetry, EOMI,   oropharynx pink and moist.  Neck supple no JVD, no mass.  Chest: Clear to auscultation bilaterally.  CVS: S1, S2 no murmurs, no S3.Regular rate.  ABD: Soft non tender.   Ext: No edema  MS: Adequate ROM spine, shoulders, hips and knees.  Skin: Intact, no ulcerations or rash noted.  Psych: Good eye contact, normal affect. Memory intact not anxious or depressed appearing.  CNS: CN 2-12 intact, power,  normal throughout.no focal deficits noted.   Assessment & Plan  Chronic fatigue disorder Good response to current medication, continue same, also discussed new policy of physically coming in to the office for 3 months for medication review, she understands and agrees  Hot flashes Working with effexor,  considering HRT, in consultation with gyne about this, encouraged focus on lifestyle change also discussed  Obesity (BMI 30.0-34.9) Deteriorated. Patient re-educated about  the importance of commitment to a  minimum of 150 minutes of exercise per week.  The importance of healthy food choices with portion control discussed. Encouraged to start a food diary, count calories and to consider  joining a support group. Sample diet sheets offered. Goals set by the patient for the next several months.   Weight /BMI 04/15/2016 12/12/2015 11/27/2015  WEIGHT 174 lb 173 lb 170 lb  HEIGHT 5\' 1"  5\' 1"  5\' 0"   BMI 32.88 kg/m2 32.69 kg/m2 33.2 kg/m2  start half phentermine daily    Prehypertension DASH diet and commitment to daily physical activity for a minimum of 30 minutes discussed and encouraged, as a part of hypertension management. The importance of attaining a healthy weight is also discussed.  BP/Weight 04/15/2016 12/12/2015 11/27/2015 04/11/2015 08/10/2014 05/25/2014 [id number]  Systolic BP 0000000 0000000 123456 [id number] - [id number] 0000000  Diastolic BP 82 80 82 78 - 82 90  Wt. (Lbs) 174 173 170 171 164 158 163.4  BMI 32.88 32.69 33.2 33.4 32.03 30.86 31.91       Hyperlipidemia LDL goal <100 Hyperlipidemia:Low fat diet discussed and encouraged.   Lipid Panel  Lab Results  Component Value Date   CHOL 200 (H) 03/07/2016   HDL 45 (L) 03/07/2016   LDLCALC 114 (H) 03/07/2016   TRIG 206 (H) 03/07/2016   CHOLHDL 4.4 03/07/2016   deteriorated    Prediabetes  Patient educated about the importance of limiting  Carbohydrate intake , the need to commit to daily physical activity for a minimum of 30 minutes , and to commit weight loss. The fact that changes in all these areas will reduce or eliminate all together the development of diabetes is stressed.  Unchnaged   Diabetic Labs Latest Ref Rng & Units 03/07/2016 04/10/2015 05/24/2014 01/26/2014 10/06/2012  HbA1c <5.7 % 5.8(H) 5.8(H) 5.8(H) 5.8(H) 5.3  Chol <200 mg/dL  200(H) 177 - 160 167  HDL >50 mg/dL 45(L) 49 - 43 34(L)  Calc LDL <100 mg/dL 114(H) 108 - 90 101(H)  Triglycerides <150 mg/dL 206(H) 101 - 135 159(H)  Creatinine 0.50 - 1.05 mg/dL 0.76 0.78 0.75 0.79 0.80   BP/Weight 04/15/2016 12/12/2015 11/27/2015 04/11/2015 08/10/2014 05/25/2014 [id number]  Systolic BP 0000000 0000000 123456 [id number] - [id number] 0000000  Diastolic BP 82 80 82 78 - 82 90  Wt. (Lbs) 174 173 170 171 164 158 163.4  BMI 32.88 32.69 33.2 33.4 32.03 30.86 31.91   No flowsheet data found.    Metabolic syndrome X The increased risk of cardiovascular disease associated with this diagnosis, and the need to consistently work on lifestyle to change this is discussed. Following  a  heart healthy diet ,commitment to 30 minutes of exercise at least 5 days per week, as well as control of blood sugar and cholesterol , and achieving a healthy weight are all the areas to be addressed .   Need for influenza vaccination After obtaining informed consent, the vaccine is  administered by LPN.

## 2016-04-15 NOTE — Assessment & Plan Note (Signed)
Deteriorated. Patient re-educated about  the importance of commitment to a  minimum of 150 minutes of exercise per week.  The importance of healthy food choices with portion control discussed. Encouraged to start a food diary, count calories and to consider  joining a support group. Sample diet sheets offered. Goals set by the patient for the next several months.   Weight /BMI 04/15/2016 12/12/2015 11/27/2015  WEIGHT 174 lb 173 lb 170 lb  HEIGHT 5\' 1"  5\' 1"  5\' 0"   BMI 32.88 kg/m2 32.69 kg/m2 33.2 kg/m2  start half phentermine daily

## 2016-04-15 NOTE — Assessment & Plan Note (Signed)
After obtaining informed consent, the vaccine is  administered by LPN.  

## 2016-04-15 NOTE — Assessment & Plan Note (Signed)
Good response to current medication, continue same, also discussed new policy of physically coming in to the office for 3 months for medication review, she understands and agrees

## 2016-04-15 NOTE — Assessment & Plan Note (Signed)
Patient educated about the importance of limiting  Carbohydrate intake , the need to commit to daily physical activity for a minimum of 30 minutes , and to commit weight loss. The fact that changes in all these areas will reduce or eliminate all together the development of diabetes is stressed.  Unchnaged   Diabetic Labs Latest Ref Rng & Units 03/07/2016 04/10/2015 05/24/2014 01/26/2014 10/06/2012  HbA1c <5.7 % 5.8(H) 5.8(H) 5.8(H) 5.8(H) 5.3  Chol <200 mg/dL 200(H) 177 - 160 167  HDL >50 mg/dL 45(L) 49 - 43 34(L)  Calc LDL <100 mg/dL 114(H) 108 - 90 101(H)  Triglycerides <150 mg/dL 206(H) 101 - 135 159(H)  Creatinine 0.50 - 1.05 mg/dL 0.76 0.78 0.75 0.79 0.80   BP/Weight 04/15/2016 12/12/2015 11/27/2015 04/11/2015 08/10/2014 05/25/2014 [id number]  Systolic BP 0000000 0000000 123456 [id number] - [id number] 0000000  Diastolic BP 82 80 82 78 - 82 90  Wt. (Lbs) 174 173 170 171 164 158 163.4  BMI 32.88 32.69 33.2 33.4 32.03 30.86 31.91   No flowsheet data found.

## 2016-04-15 NOTE — Assessment & Plan Note (Signed)
Working with Brewing technologist, considering HRT, in consultation with gyne about this, encouraged focus on lifestyle change also discussed

## 2016-05-09 ENCOUNTER — Encounter: Payer: Self-pay | Admitting: Advanced Practice Midwife

## 2016-05-12 ENCOUNTER — Other Ambulatory Visit: Payer: Self-pay | Admitting: Advanced Practice Midwife

## 2016-05-13 ENCOUNTER — Encounter: Payer: Self-pay | Admitting: Advanced Practice Midwife

## 2016-05-14 ENCOUNTER — Encounter: Payer: Self-pay | Admitting: Advanced Practice Midwife

## 2016-05-14 ENCOUNTER — Other Ambulatory Visit: Payer: Self-pay | Admitting: Advanced Practice Midwife

## 2016-05-14 MED ORDER — ESTRADIOL-NORETHINDRONE ACET 0.05-0.14 MG/DAY TD PTTW: 1.0000 | MEDICATED_PATCH | TRANSDERMAL | 12 refills | Status: DC

## 2016-05-14 MED FILL — COMBIPATCH 0.05-0.14 MG PTC: 0.05-0.14 | 28 days supply | Qty: 8 | Fill #0

## 2016-05-14 NOTE — Progress Notes (Signed)
combi patch rx for vasomotor sx

## 2016-06-12 ENCOUNTER — Encounter: Payer: Self-pay | Admitting: Advanced Practice Midwife

## 2016-06-13 ENCOUNTER — Other Ambulatory Visit: Payer: Self-pay | Admitting: Advanced Practice Midwife

## 2016-06-13 ENCOUNTER — Encounter: Payer: Self-pay | Admitting: Advanced Practice Midwife

## 2016-06-13 MED ORDER — ESTRADIOL 0.05 MG/24HR TD PTWK: 0.0500 mg | MEDICATED_PATCH | TRANSDERMAL | 12 refills | Status: DC

## 2016-06-13 MED ORDER — MEDROXYPROGESTERONE ACETATE 10 MG PO TABS: 10.0000 mg | ORAL_TABLET | Freq: Every day | ORAL | 11 refills | Status: DC

## 2016-06-13 MED FILL — MEDROXYPROGESTERONE 10 MG T: 10 | 30 days supply | Qty: 30 | Fill #0

## 2016-06-13 MED FILL — ESTRADIOL 0.05 MG/DAY PATCH: 0.05 | 28 days supply | Qty: 4 | Fill #0

## 2016-06-13 NOTE — Progress Notes (Unsigned)
Climara patch 0.05 and daily 10mg  provera per pt request.  States friends say this regimen w/ oral pg and est patch help w/sex life, energy, weight, etc.  As well as hot flashes,

## 2016-06-16 MED FILL — AMPHETAMINE SALTS 20 MG TAB: 20 | 90 days supply | Qty: 90 | Fill #0

## 2016-06-25 MED FILL — VENLAFAXINE HCL ER 75 MG CA: 75 | 90 days supply | Qty: 90 | Fill #1

## 2016-07-14 MED FILL — MEDROXYPROGESTERONE 10 MG T: 10 | 30 days supply | Qty: 30 | Fill #1

## 2016-07-14 MED FILL — ESTRADIOL 0.05 MG/DAY PATCH: 0.05 | 28 days supply | Qty: 4 | Fill #1

## 2016-07-15 ENCOUNTER — Ambulatory Visit: Payer: 59 | Admitting: Family Medicine

## 2016-07-16 ENCOUNTER — Telehealth: Payer: Self-pay

## 2016-07-16 NOTE — Telephone Encounter (Signed)
No need to collect script in May, let us schedule  Next appt for when script is due which would be for July please

## 2016-07-16 NOTE — Telephone Encounter (Signed)
Scott came by to get her adderall refill and I advised him that it wasn't pre printed anymore and that a 3 month supply was given during visit. Sharolyn cancelled her appt yesterday but Jackelyn Poling is attempting to contact her to reschedule. Last adderall filled 04/09 (#90) - should last until July, correct?

## 2016-07-18 NOTE — Telephone Encounter (Signed)
Patient made appt in May

## 2016-07-18 NOTE — Telephone Encounter (Signed)
Patient requested may appt per debbie

## 2016-07-23 ENCOUNTER — Other Ambulatory Visit: Payer: Self-pay | Admitting: Family Medicine

## 2016-07-23 DIAGNOSIS — Z1231 Encounter for screening mammogram for malignant neoplasm of breast: Secondary | ICD-10-CM

## 2016-07-25 ENCOUNTER — Ambulatory Visit
Admission: RE | Admit: 2016-07-25 | Discharge: 2016-07-25 | Disposition: A | Discharge status: Home / Self Care | Payer: 59 | Source: Ambulatory Visit | Attending: Family Medicine | Admitting: Family Medicine

## 2016-07-25 DIAGNOSIS — Z1231 Encounter for screening mammogram for malignant neoplasm of breast: Secondary | ICD-10-CM

## 2016-07-30 ENCOUNTER — Encounter: Payer: Self-pay | Admitting: Family Medicine

## 2016-07-31 ENCOUNTER — Ambulatory Visit: Payer: 59 | Admitting: Family Medicine

## 2016-07-31 ENCOUNTER — Telehealth: Payer: Self-pay

## 2016-07-31 DIAGNOSIS — R030 Elevated blood-pressure reading, without diagnosis of hypertension: Secondary | ICD-10-CM

## 2016-07-31 DIAGNOSIS — E8881 Metabolic syndrome: Secondary | ICD-10-CM

## 2016-07-31 DIAGNOSIS — R7303 Prediabetes: Secondary | ICD-10-CM

## 2016-07-31 DIAGNOSIS — W57XXXA Bitten or stung by nonvenomous insect and other nonvenomous arthropods, initial encounter: Secondary | ICD-10-CM

## 2016-07-31 DIAGNOSIS — E785 Hyperlipidemia, unspecified: Secondary | ICD-10-CM

## 2016-07-31 NOTE — Telephone Encounter (Signed)
Lab ordered.

## 2016-08-05 MED FILL — ESTRADIOL 0.05 MG/DAY PATCH: 0.05 | 28 days supply | Qty: 4 | Fill #2

## 2016-08-06 ENCOUNTER — Encounter: Payer: Self-pay | Admitting: Family Medicine

## 2016-08-07 ENCOUNTER — Telehealth: Payer: Self-pay

## 2016-08-07 DIAGNOSIS — E785 Hyperlipidemia, unspecified: Secondary | ICD-10-CM | POA: Diagnosis not present

## 2016-08-07 DIAGNOSIS — R030 Elevated blood-pressure reading, without diagnosis of hypertension: Secondary | ICD-10-CM | POA: Diagnosis not present

## 2016-08-07 DIAGNOSIS — R7303 Prediabetes: Secondary | ICD-10-CM | POA: Diagnosis not present

## 2016-08-07 DIAGNOSIS — W57XXXA Bitten or stung by nonvenomous insect and other nonvenomous arthropods, initial encounter: Secondary | ICD-10-CM | POA: Diagnosis not present

## 2016-08-07 LAB — CBC
HCT: 42.2 % (ref 35.0–45.0)
Hemoglobin: 14.3 g/dL (ref 11.7–15.5)
MCH: 31 pg (ref 27.0–33.0)
MCHC: 33.9 g/dL (ref 32.0–36.0)
MCV: 91.5 fL (ref 80.0–100.0)
MPV: 10.3 fL (ref 7.5–12.5)
PLATELETS: 273 10*3/uL (ref 140–400)
RBC: 4.61 MIL/uL (ref 3.80–5.10)
RDW: 13.2 % (ref 11.0–15.0)
WBC: 6.6 10*3/uL (ref 3.8–10.8)

## 2016-08-07 NOTE — Telephone Encounter (Signed)
Labs ordered.

## 2016-08-08 LAB — ROCKY MTN SPOTTED FVR ABS PNL(IGG+IGM)
RMSF IgG: NOT DETECTED
RMSF IgM: NOT DETECTED

## 2016-08-08 LAB — LIPID PANEL
CHOL/HDL RATIO: 4.5 ratio (ref <5.0)
CHOLESTEROL: 176 mg/dL (ref <200)
HDL: 39 mg/dL — ABNORMAL LOW (ref >50)
LDL Cholesterol: 113 mg/dL — ABNORMAL HIGH (ref <100)
Triglycerides: 118 mg/dL (ref <150)
VLDL: 24 mg/dL (ref <30)

## 2016-08-08 LAB — COMPLETE METABOLIC PANEL WITH GFR
ALT: 12 U/L (ref 6–29)
AST: 12 U/L (ref 10–35)
Albumin: 4.2 g/dL (ref 3.6–5.1)
Alkaline Phosphatase: 90 U/L (ref 33–130)
BILIRUBIN TOTAL: 0.4 mg/dL (ref 0.2–1.2)
BUN: 12 mg/dL (ref 7–25)
CHLORIDE: 104 mmol/L (ref 98–110)
CO2: 24 mmol/L (ref 20–31)
Calcium: 9.2 mg/dL (ref 8.6–10.4)
Creat: 0.84 mg/dL (ref 0.50–1.05)
GFR, EST NON AFRICAN AMERICAN: 81 mL/min (ref >=60)
Glucose, Bld: 110 mg/dL — ABNORMAL HIGH (ref 65–99)
Potassium: 4.3 mmol/L (ref 3.5–5.3)
Sodium: 139 mmol/L (ref 135–146)
TOTAL PROTEIN: 7.1 g/dL (ref 6.1–8.1)

## 2016-08-08 LAB — LYME AB/WESTERN BLOT REFLEX: B burgdorferi Ab IgG+IgM: <0.9 Index (ref <0.90)

## 2016-08-08 LAB — HEMOGLOBIN A1C
Hgb A1c MFr Bld: 5.9 % — ABNORMAL HIGH (ref <5.7)
MEAN PLASMA GLUCOSE: 123 mg/dL

## 2016-08-13 ENCOUNTER — Encounter: Payer: Self-pay | Admitting: Family Medicine

## 2016-08-13 LAB — LYME DISEASE ABS IGG, IGM, IFA, CSF

## 2016-08-15 MED FILL — MEDROXYPROGESTERONE 10 MG T: 10 | 30 days supply | Qty: 30 | Fill #2

## 2016-09-01 MED FILL — ESTRADIOL 0.05 MG/DAY PATCH: 0.05 | 28 days supply | Qty: 4 | Fill #3

## 2016-09-04 ENCOUNTER — Encounter: Payer: Self-pay | Admitting: Podiatry

## 2016-09-04 ENCOUNTER — Ambulatory Visit (INDEPENDENT_AMBULATORY_CARE_PROVIDER_SITE_OTHER): Payer: 59 | Admitting: Podiatry

## 2016-09-04 DIAGNOSIS — L600 Ingrowing nail: Secondary | ICD-10-CM | POA: Diagnosis not present

## 2016-09-04 NOTE — Patient Instructions (Signed)
It was nice to meet you today. If you have any questions or concerns, please feel free to give [place] a call at [phone]. Have a good weekend.    Soak Instructions    THE DAY AFTER THE PROCEDURE  Place 1/4 cup of epsom salts in a quart of warm tap water.  Submerge your foot or feet with outer bandage intact for the initial soak; this will allow the bandage to become moist and wet for easy lift off.  Once you remove your bandage, continue to soak in the solution for 20 minutes.  This soak should be done twice a day.  Next, remove your foot or feet from solution, blot dry the affected area and cover.  You may use a band aid large enough to cover the area or use gauze and tape.  Apply other medications to the area as directed by the doctor such as polysporin neosporin.  IF YOUR SKIN BECOMES IRRITATED WHILE USING THESE INSTRUCTIONS, IT IS OKAY TO SWITCH TO  WHITE VINEGAR AND WATER. Or you may use antibacterial soap and water to keep the toe clean  Monitor for any signs/symptoms of infection. Call the office immediately if any occur or go directly to the emergency room. Call with any questions/concerns.

## 2016-09-07 DIAGNOSIS — L600 Ingrowing nail: Secondary | ICD-10-CM | POA: Insufficient documentation

## 2016-09-07 NOTE — Progress Notes (Signed)
Subjective:    Patient ID: Carmen Mcintyre, female   DOB: 51 y.o.   MRN: [id number]   HPI Carmen Mcintyre Presents the office today for concerns of the split nail to the right third toe which is been ongoing for last couple weeks has been painful. She tries to trim the area out herself but comes back and is painful mostly with pressure in shoes. Denies any drainage or pus. Other than trying tenderness out herself recently she's had no other treatment. She has no other concerns today.   Review of Systems  All other systems reviewed and are negative.       Objective:  Physical Exam General: AAO x3, NAD  Dermatological: The lateral aspect of the right third digit toenails incurvated within the skin and there is tenderness palpation lateral nail border. There is localized edema to this area but is no drainage or pus expressed. There is no ascending cellulitis. There is no open lesions or pre-ulcer lesions identified.  Vascular: Dorsalis Pedis artery and Posterior Tibial artery pedal pulses are 2/4 bilateral with immedate capillary fill time. Pedal hair is present. To the right 3rd toe this is good CRT and color to the digit. There is no pain with calf compression, swelling, warmth, erythema.   Neruologic: Grossly intact via light touch bilateral. Vibratory intact via tuning fork bilateral. Protective threshold with Semmes Wienstein monofilament intact to all pedal sites bilateral.  Musculoskeletal: No gross boney pedal deformities bilateral. No pain, crepitus, or limitation noted with foot and ankle range of motion bilateral. Muscular strength 5/5 in all groups tested bilateral.  Gait: Unassisted, Nonantalgic.      Assessment:     51 year old female ingrown toenail right third toenail, lateral border    Plan:  -Treatment options discussed including all alternatives, risks, and complications -Etiology of symptoms were discussed -At this time, the patient is requesting partial nail removal with  chemical matricectomy to the symptomatic portion of the nail. Risks and complications were discussed with the patient for which they understand and  verbally consent to the procedure. Under sterile conditions a total of 3 mL of a mixture of 2% lidocaine plain and 0.5% Marcaine plain was infiltrated in a digital block fashion. Once anesthetized, the skin was prepped in sterile fashion. A tourniquet was then applied. Next the right 3rd digit lateral aspect of hallux nail border was then sharply excised making sure to remove the entire offending nail border. Once the nails were ensured to be removed area was debrided and the underlying skin was intact. There is no purulence identified in the procedure. Next phenol was then applied under standard conditions and copiously irrigated. Silvadene was applied. A dry sterile dressing was applied. After application of the dressing the tourniquet was removed and there is found to be an immediate capillary refill time to the digit. The patient tolerated the procedure well any complications. Post procedure instructions were discussed the patient for which he verbally understood. Follow-up in one week for nail check or sooner if any problems are to arise. Discussed signs/symptoms of infection and directed to call the office immediately should any occur or go directly to the emergency room. In the meantime, encouraged to call the office with any questions, concerns, changes symptoms.  Celesta Gentile, DPM

## 2016-09-08 ENCOUNTER — Telehealth: Payer: Self-pay | Admitting: *Deleted

## 2016-09-08 NOTE — Telephone Encounter (Signed)
Called patient per Dr Jacqualyn Posey and patient states that her toe is doing real good and I have soaked to several times and if any concerns call the office. Lattie Haw

## 2016-09-11 ENCOUNTER — Encounter: Payer: Self-pay | Admitting: Family Medicine

## 2016-09-11 ENCOUNTER — Ambulatory Visit (INDEPENDENT_AMBULATORY_CARE_PROVIDER_SITE_OTHER): Payer: 59 | Admitting: Family Medicine

## 2016-09-11 VITALS — BP 126/80 | HR 120 | Temp 98.9°F | Ht 61.0 in | Wt 173.0 lb

## 2016-09-11 DIAGNOSIS — F418 Other specified anxiety disorders: Secondary | ICD-10-CM | POA: Diagnosis not present

## 2016-09-11 DIAGNOSIS — R5382 Chronic fatigue, unspecified: Secondary | ICD-10-CM | POA: Diagnosis not present

## 2016-09-11 DIAGNOSIS — R7303 Prediabetes: Secondary | ICD-10-CM

## 2016-09-11 DIAGNOSIS — E785 Hyperlipidemia, unspecified: Secondary | ICD-10-CM | POA: Diagnosis not present

## 2016-09-11 DIAGNOSIS — E8881 Metabolic syndrome: Secondary | ICD-10-CM | POA: Diagnosis not present

## 2016-09-11 DIAGNOSIS — G9332 Myalgic encephalomyelitis/chronic fatigue syndrome: Secondary | ICD-10-CM

## 2016-09-11 DIAGNOSIS — E669 Obesity, unspecified: Secondary | ICD-10-CM

## 2016-09-11 MED ORDER — AMPHETAMINE-DEXTROAMPHETAMINE 20 MG PO TABS: 20.0000 mg | ORAL_TABLET | Freq: Every day | ORAL | 0 refills | Status: DC

## 2016-09-11 MED ORDER — VENLAFAXINE HCL ER 150 MG PO CP24: 150.0000 mg | ORAL_CAPSULE | Freq: Every day | ORAL | 1 refills | Status: DC

## 2016-09-11 MED FILL — VENLAFAXINE HCL ER 150 MG C: 150 | 90 days supply | Qty: 90 | Fill #0

## 2016-09-11 NOTE — Patient Instructions (Signed)
F/U in 6 weeks, call if you need me before  Please schedule therapy appt as we discussed and also note increase in effexor dose  No change in adderall   It is important that you exercise regularly at least 30 minutes 5 times a week. If you develop chest pain, have severe difficulty breathing, or feel very tired, stop exercising immediately and seek medical attention    Thank you  for choosing [facility]. We consider it a privelige to serve you.  Delivering excellent health care in a caring and  compassionate way is our goal.  Partnering with you,  so that together we can achieve this goal is our strategy.

## 2016-09-14 NOTE — Progress Notes (Signed)
Carmen Mcintyre     MRN: [id number]      DOB: February 02, 1966   HPI Carmen Mcintyre is here for follow up and re-evaluation of chronic medical conditions, medication management and review of any available recent lab and radiology data.  Preventive health is updated, specifically  Cancer screening and Immunization.   The PT denies any adverse reactions to current medications since the last visit.  Notes 2 to 4 month h/o increased withdrawal, sadness and lack of interest or pleasure in life, not suicidal or homicidal, during discussion states that she realizes she misses her Dad who she lost unexpectedly earlier this year. Has been uncertain if her symptoms were from chronic fatigue worsening or from depression or grief. She did go to one counseling session , she benefitted , but did not return, she is very open to counseling , wants to feel better   ROS Denies recent fever or chills. Denies sinus pressure, nasal congestion, ear pain or sore throat. Denies chest congestion, productive cough or wheezing. Denies chest pains, palpitations and leg swelling Denies abdominal pain, nausea, vomiting,diarrhea or constipation.   Denies dysuria, frequency, hesitancy or incontinence. Denies joint pain, swelling and limitation in mobility. Denies headaches, seizures, numbness, or tingling.  Denies skin break down or rash.   PE  BP 126/80 (BP Location: Right Arm, Patient Position: Sitting, Cuff Size: Large)   Pulse (!) 120   Temp 98.9 F (37.2 C)   Ht 5\' 1"  (1.549 m)   Wt 173 lb (78.5 kg)   LMP 08/22/2016   SpO2 100%   BMI 32.69 kg/m   Patient alert and oriented and in no cardiopulmonary distress.  HEENT: No facial asymmetry, EOMI,   oropharynx pink and moist.  Neck supple no JVD, no mass.  Chest: Clear to auscultation bilaterally.  CVS: S1, S2 no murmurs, no S3.Regular rate.  ABD: Soft non tender.   Ext: No edema  MS: Adequate ROM spine, shoulders, hips and knees.  Skin: Intact, no ulcerations  or rash noted.  Psych: Good eye contact, flat  affect. Memory intact not anxious mildly tearful and  depressed appearing.  CNS: CN 2-12 intact, power,  normal throughout.no focal deficits noted.   Assessment & Plan  Depression with anxiety Not suicidal or homicidal.Wants to feel better and very receptive to counselling Increase effexor dose and review in 6 weeks Encourage to commit to regular exercise also  CHRONIC FATIGUE SYNDROME No chnage in dose of adderall  Hyperlipidemia LDL goal <100 Hyperlipidemia:Low fat diet discussed and encouraged.   Lipid Panel  Lab Results  Component Value Date   CHOL 176 08/07/2016   HDL 39 (L) 08/07/2016   LDLCALC 113 (H) 08/07/2016   TRIG 118 08/07/2016   CHOLHDL 4.5 08/07/2016   Needs to reduce fat intake and commit to more regular exercise   Prediabetes Patient educated about the importance of limiting  Carbohydrate intake , the need to commit to daily physical activity for a minimum of 30 minutes , and to commit weight loss. The fact that changes in all these areas will reduce or eliminate all together the development of diabetes is stressed.   deteriorated Diabetic Labs Latest Ref Rng & Units 08/07/2016 03/07/2016 04/10/2015 05/24/2014 01/26/2014  HbA1c <5.7 % 5.9(H) 5.8(H) 5.8(H) 5.8(H) 5.8(H)  Chol <200 mg/dL 176 200(H) 177 - 160  HDL >50 mg/dL 39(L) 45(L) 49 - 43  Calc LDL <100 mg/dL 113(H) 114(H) 108 - 90  Triglycerides <150 mg/dL 118 206(H) 101 -  135  Creatinine 0.50 - 1.05 mg/dL 0.84 0.76 0.78 0.75 0.79   BP/Weight 09/11/2016 04/15/2016 12/12/2015 11/27/2015 04/11/2015 08/10/2014 10/27/6013  Systolic BP 615 379 432 761 470 - 929  Diastolic BP 80 82 80 82 78 - 82  Wt. (Lbs) 173 174 173 170 171 164 158  BMI 32.69 32.88 32.69 33.2 33.4 32.03 30.86   No flowsheet data found.     Obesity (BMI 30.0-34.9) Unchnaged Patient re-educated about  the importance of commitment to a  minimum of 150 minutes of exercise per week.  The  importance of healthy food choices with portion control discussed. Encouraged to start a food diary, count calories and to consider  joining a support group. Sample diet sheets offered. Goals set by the patient for the next several months.   Weight /BMI 09/11/2016 04/15/2016 12/12/2015  WEIGHT 173 lb 174 lb 173 lb  HEIGHT 5\' 1"  5\' 1"  5\' 1"   BMI 32.69 kg/m2 32.88 kg/m2 32.69 kg/m2      Metabolic syndrome X The increased risk of cardiovascular disease associated with this diagnosis, and the need to consistently work on lifestyle to change this is discussed. Following  a  heart healthy diet ,commitment to 30 minutes of exercise at least 5 days per week, as well as control of blood sugar and cholesterol , and achieving a healthy weight are all the areas to be addressed .

## 2016-09-14 NOTE — Assessment & Plan Note (Addendum)
Unchnaged Patient re-educated about  the importance of commitment to a  minimum of 150 minutes of exercise per week.  The importance of healthy food choices with portion control discussed. Encouraged to start a food diary, count calories and to consider  joining a support group. Sample diet sheets offered. Goals set by the patient for the next several months.   Weight /BMI 09/11/2016 04/15/2016 12/12/2015  WEIGHT 173 lb 174 lb 173 lb  HEIGHT 5\' 1"  5\' 1"  5\' 1"   BMI 32.69 kg/m2 32.88 kg/m2 32.69 kg/m2

## 2016-09-14 NOTE — Assessment & Plan Note (Signed)
Hyperlipidemia:Low fat diet discussed and encouraged.   Lipid Panel  Lab Results  Component Value Date   CHOL 176 08/07/2016   HDL 39 (L) 08/07/2016   LDLCALC 113 (H) 08/07/2016   TRIG 118 08/07/2016   CHOLHDL 4.5 08/07/2016   Needs to reduce fat intake and commit to more regular exercise

## 2016-09-14 NOTE — Assessment & Plan Note (Signed)
The increased risk of cardiovascular disease associated with this diagnosis, and the need to consistently work on lifestyle to change this is discussed. Following  a  heart healthy diet ,commitment to 30 minutes of exercise at least 5 days per week, as well as control of blood sugar and cholesterol , and achieving a healthy weight are all the areas to be addressed .  

## 2016-09-14 NOTE — Assessment & Plan Note (Signed)
Not suicidal or homicidal.Wants to feel better and very receptive to counselling Increase effexor dose and review in 6 weeks Encourage to commit to regular exercise also

## 2016-09-14 NOTE — Assessment & Plan Note (Signed)
No chnage in dose of adderall

## 2016-09-14 NOTE — Assessment & Plan Note (Signed)
Patient educated about the importance of limiting  Carbohydrate intake , the need to commit to daily physical activity for a minimum of 30 minutes , and to commit weight loss. The fact that changes in all these areas will reduce or eliminate all together the development of diabetes is stressed.   deteriorated Diabetic Labs Latest Ref Rng & Units 08/07/2016 03/07/2016 04/10/2015 05/24/2014 01/26/2014  HbA1c <5.7 % 5.9(H) 5.8(H) 5.8(H) 5.8(H) 5.8(H)  Chol <200 mg/dL 176 200(H) 177 - 160  HDL >50 mg/dL 39(L) 45(L) 49 - 43  Calc LDL <100 mg/dL 113(H) 114(H) 108 - 90  Triglycerides <150 mg/dL 118 206(H) 101 - 135  Creatinine 0.50 - 1.05 mg/dL 0.84 0.76 0.78 0.75 0.79   BP/Weight 09/11/2016 04/15/2016 12/12/2015 11/27/2015 04/11/2015 08/10/2014 0/71/2197  Systolic BP 588 325 498 264 158 - 309  Diastolic BP 80 82 80 82 78 - 82  Wt. (Lbs) 173 174 173 170 171 164 158  BMI 32.69 32.88 32.69 33.2 33.4 32.03 30.86   No flowsheet data found.

## 2016-09-15 ENCOUNTER — Ambulatory Visit (INDEPENDENT_AMBULATORY_CARE_PROVIDER_SITE_OTHER): Payer: Self-pay | Admitting: Podiatry

## 2016-09-15 ENCOUNTER — Encounter: Payer: Self-pay | Admitting: Podiatry

## 2016-09-15 DIAGNOSIS — Z9889 Other specified postprocedural states: Secondary | ICD-10-CM

## 2016-09-15 DIAGNOSIS — L600 Ingrowing nail: Secondary | ICD-10-CM

## 2016-09-15 MED FILL — DEXTROAMP-AMPHETAMIN 20 MG: 20 | 90 days supply | Qty: 90 | Fill #0

## 2016-09-15 NOTE — Patient Instructions (Signed)

## 2016-09-16 NOTE — Progress Notes (Signed)
Subjective: Carmen Mcintyre is a 51 y.o.  Female returns to office today for follow up evaluation after having right 3rd digit partial nail avulsion performed with chemical matrixectomy Patient has been soaking using epsom salts and applying topical antibiotic covered with bandaid daily until the last 2 days. Denies any pain, drainage, swelling and she states that she is doing "good". Patient denies fevers, chills, nausea, vomiting. Denies any calf pain, chest pain, SOB.   Objective:  Vitals: Reviewed  General: Well developed, nourished, in no acute distress, alert and oriented x3   Dermatology: Skin is warm, dry and supple bilateral. Right 3rd digit nail border appears to be clean, dry, with mild granular tissue and surrounding scab. There is faint surrounding erythema but there is no ascending cellulitis, edema, drainage/purulence. The remaining nails appear unremarkable at this time. There are no other lesions or other signs of infection present.  Neurovascular status: Intact. No lower extremity swelling; No pain with calf compression bilateral. Normal color to the toe.   Musculoskeletal: No tenderness to palpation of the right 3rd digit. Muscular strength within normal limits bilateral.   Assesement and Plan: S/p partial nail avulsion, doing well.   -Continue soaking in epsom salts twice a day followed by antibiotic ointment and a band-aid. Can leave uncovered at night. Continue this until completely healed.  -If the area has not healed in 2 weeks, call the office for follow-up appointment, or sooner if any problems arise.  -Monitor for any signs/symptoms of infection. Call the office immediately if any occur or go directly to the emergency room. Call with any questions/concerns.  Celesta Gentile, DPM

## 2016-09-25 ENCOUNTER — Ambulatory Visit: Payer: 59 | Admitting: Podiatry

## 2016-09-26 ENCOUNTER — Ambulatory Visit: Payer: 59 | Admitting: Podiatry

## 2016-09-26 DIAGNOSIS — H5203 Hypermetropia, bilateral: Secondary | ICD-10-CM | POA: Diagnosis not present

## 2016-10-01 MED FILL — ESTRADIOL 0.05 MG/DAY PATCH: 0.05 | 84 days supply | Qty: 12 | Fill #4

## 2016-10-23 ENCOUNTER — Ambulatory Visit (INDEPENDENT_AMBULATORY_CARE_PROVIDER_SITE_OTHER): Payer: 59 | Admitting: Family Medicine

## 2016-10-23 ENCOUNTER — Encounter: Payer: Self-pay | Admitting: Family Medicine

## 2016-10-23 VITALS — BP 130/82 | HR 97 | Resp 16 | Ht 61.0 in | Wt 177.0 lb

## 2016-10-23 DIAGNOSIS — R5382 Chronic fatigue, unspecified: Secondary | ICD-10-CM

## 2016-10-23 DIAGNOSIS — G9332 Myalgic encephalomyelitis/chronic fatigue syndrome: Secondary | ICD-10-CM

## 2016-10-23 DIAGNOSIS — R7303 Prediabetes: Secondary | ICD-10-CM | POA: Diagnosis not present

## 2016-10-23 DIAGNOSIS — E8881 Metabolic syndrome: Secondary | ICD-10-CM

## 2016-10-23 DIAGNOSIS — E669 Obesity, unspecified: Secondary | ICD-10-CM | POA: Diagnosis not present

## 2016-10-23 DIAGNOSIS — E785 Hyperlipidemia, unspecified: Secondary | ICD-10-CM

## 2016-10-23 DIAGNOSIS — F418 Other specified anxiety disorders: Secondary | ICD-10-CM | POA: Diagnosis not present

## 2016-10-23 MED ORDER — AMPHETAMINE-DEXTROAMPHET ER 30 MG PO CP24: 30.0000 mg | ORAL_CAPSULE | ORAL | 0 refills | Status: DC

## 2016-10-23 NOTE — Assessment & Plan Note (Signed)
Marked improvement, continue current medication dose Encouraged to commit to daily exercise and also to change diet

## 2016-10-23 NOTE — Patient Instructions (Signed)
F/u in 12 weeks, call if you need me before  New dose of adderall is 30 mg one daily  Thankful you feel much better  Remember change to foods that GIVE energy, , living foods, full of life and color and water1  It is important that you exercise regularly at least 30 minutes 5 times a week. If you develop chest pain, have severe difficulty breathing, or feel very tired, stop exercising immediately and seek medical attention    Fasting lipid, hBA1C 3 days before next visit  Thank you  for choosing [facility]. We consider it a privelige to serve you.  Delivering excellent health care in a caring and  compassionate way is our goal.  Partnering with you,  so that together we can achieve this goal is our strategy.

## 2016-10-23 NOTE — Assessment & Plan Note (Signed)
Hyperlipidemia:Low fat diet discussed and encouraged.   Lipid Panel  Lab Results  Component Value Date   CHOL 176 08/07/2016   HDL 39 (L) 08/07/2016   LDLCALC 113 (H) 08/07/2016   TRIG 118 08/07/2016   CHOLHDL 4.5 08/07/2016   Updated lab needed at/ before next visit.

## 2016-10-23 NOTE — Assessment & Plan Note (Signed)
Deteriorated. Patient re-educated about  the importance of commitment to a  minimum of 150 minutes of exercise per week.  The importance of healthy food choices with portion control discussed. Encouraged to start a food diary, count calories and to consider  joining a support group. Sample diet sheets offered. Goals set by the patient for the next several months.   Weight /BMI 10/23/2016 09/11/2016 04/15/2016  WEIGHT 177 lb 173 lb 174 lb  HEIGHT 5\' 1"  5\' 1"  5\' 1"   BMI 33.44 kg/m2 32.69 kg/m2 32.88 kg/m2

## 2016-10-23 NOTE — Progress Notes (Signed)
Carmen Mcintyre     MRN: [id number]      DOB: [birth year]   HPI Ms. Agent is here for follow up and re-evaluation of chronic medical conditions, medication management and review of any available recent lab and radiology data.  Preventive health is updated, specifically  Cancer screening and Immunization.  Still needs colonsopy Questions or concerns regarding consultations or procedures which the PT has had in the interim are  addressed. The PT denies any adverse reactions to current medications since the last visit. Feels much better on increased medication dose for depression as well as therapy, noticed within 10 days of starting new dose even before therapy both by spouse and friends Still feels as though her energy bottoms out by early afternoon, has doubled the adderall a couple days with benefit. Has been overeating sweets and will change this, has started walking again Looking forward to working outside of the home    ROS Denies recent fever or chills. Denies sinus pressure, nasal congestion, ear pain or sore throat. Denies chest congestion, productive cough or wheezing. Denies chest pains, palpitations and leg swelling Denies abdominal pain, nausea, vomiting,diarrhea or constipation.   Denies dysuria, frequency, hesitancy or incontinence. Denies joint pain, swelling and limitation in mobility. Denies headaches, seizures, numbness, or tingling. Denies depression, anxiety or insomnia. Denies skin break down or rash.   PE  BP 130/82   Pulse 97   Resp 16   Ht 5\' 1"  (1.549 m)   Wt 177 lb (80.3 kg)   SpO2 96%   BMI 33.44 kg/m   Patient alert and oriented and in no cardiopulmonary distress.  HEENT: No facial asymmetry, EOMI,   oropharynx pink and moist.  Neck supple no JVD, no mass.  Chest: Clear to auscultation bilaterally.  CVS: S1, S2 no murmurs, no S3.Regular rate.  ABD: Soft non tender.   Ext: No edema  MS: Adequate ROM spine, shoulders, hips and knees.  Skin:  Intact, no ulcerations or rash noted.  Psych: Good eye contact, normal affect. Memory intact not anxious or depressed appearing.  CNS: CN 2-12 intact, power,  normal throughout.no focal deficits noted.   Assessment & Plan  Depression with anxiety Marked improvement, continue current medication dose Encouraged to commit to daily exercise and also to change diet  Chronic fatigue disorder inadeqautely treated based on symptoms increase to max dose of 30 mg daily, also change in diet and commitment to daily exercise and weight loss  Obesity (BMI 30.0-34.9) Deteriorated. Patient re-educated about  the importance of commitment to a  minimum of 150 minutes of exercise per week.  The importance of healthy food choices with portion control discussed. Encouraged to start a food diary, count calories and to consider  joining a support group. Sample diet sheets offered. Goals set by the patient for the next several months.   Weight /BMI 10/23/2016 09/11/2016 04/15/2016  WEIGHT 177 lb 173 lb 174 lb  HEIGHT 5\' 1"  5\' 1"  5\' 1"   BMI 33.44 kg/m2 32.69 kg/m2 32.88 kg/m2      Hyperlipidemia LDL goal <100 Hyperlipidemia:Low fat diet discussed and encouraged.   Lipid Panel  Lab Results  Component Value Date   CHOL 176 08/07/2016   HDL 39 (L) 08/07/2016   LDLCALC 113 (H) 08/07/2016   TRIG 118 08/07/2016   CHOLHDL 4.5 08/07/2016   Updated lab needed at/ before next visit.     Metabolic syndrome X The increased risk of cardiovascular disease associated with this diagnosis, and  the need to consistently work on lifestyle to change this is discussed. Following  a  heart healthy diet ,commitment to 30 minutes of exercise at least 5 days per week, as well as control of blood sugar and cholesterol , and achieving a healthy weight are all the areas to be addressed .   Prediabetes Patient educated about the importance of limiting  Carbohydrate intake , the need to commit to daily physical activity  for a minimum of 30 minutes , and to commit weight loss. The fact that changes in all these areas will reduce or eliminate all together the development of diabetes is stressed.   Diabetic Labs Latest Ref Rng & Units 08/07/2016 03/07/2016 04/10/2015 05/24/2014 01/26/2014  HbA1c <5.7 % 5.9(H) 5.8(H) 5.8(H) 5.8(H) 5.8(H)  Chol <200 mg/dL 176 200(H) 177 - 160  HDL >50 mg/dL 39(L) 45(L) 49 - 43  Calc LDL <100 mg/dL 113(H) 114(H) 108 - 90  Triglycerides <150 mg/dL 118 206(H) 101 - 135  Creatinine 0.50 - 1.05 mg/dL 0.84 0.76 0.78 0.75 0.79   BP/Weight 10/23/2016 09/11/2016 04/15/2016 12/12/2015 11/27/2015 04/14/8525 09/14/2421  Systolic BP 536 144 315 400 867 619 -  Diastolic BP 82 80 82 80 82 78 -  Wt. (Lbs) 177 173 174 173 170 171 164  BMI 33.44 32.69 32.88 32.69 33.2 33.4 32.03   No flowsheet data found.

## 2016-10-23 NOTE — Assessment & Plan Note (Signed)
The increased risk of cardiovascular disease associated with this diagnosis, and the need to consistently work on lifestyle to change this is discussed. Following  a  heart healthy diet ,commitment to 30 minutes of exercise at least 5 days per week, as well as control of blood sugar and cholesterol , and achieving a healthy weight are all the areas to be addressed .  

## 2016-10-23 NOTE — Assessment & Plan Note (Signed)
inadeqautely treated based on symptoms increase to max dose of 30 mg daily, also change in diet and commitment to daily exercise and weight loss

## 2016-10-23 NOTE — Assessment & Plan Note (Signed)
Patient educated about the importance of limiting  Carbohydrate intake , the need to commit to daily physical activity for a minimum of 30 minutes , and to commit weight loss. The fact that changes in all these areas will reduce or eliminate all together the development of diabetes is stressed.   Diabetic Labs Latest Ref Rng & Units 08/07/2016 03/07/2016 04/10/2015 05/24/2014 01/26/2014  HbA1c <5.7 % 5.9(H) 5.8(H) 5.8(H) 5.8(H) 5.8(H)  Chol <200 mg/dL 176 200(H) 177 - 160  HDL >50 mg/dL 39(L) 45(L) 49 - 43  Calc LDL <100 mg/dL 113(H) 114(H) 108 - 90  Triglycerides <150 mg/dL 118 206(H) 101 - 135  Creatinine 0.50 - 1.05 mg/dL 0.84 0.76 0.78 0.75 0.79   BP/Weight 10/23/2016 09/11/2016 04/15/2016 12/12/2015 11/27/2015 03/15/6058 0/06/5995  Systolic BP 741 423 953 202 334 356 -  Diastolic BP 82 80 82 80 82 78 -  Wt. (Lbs) 177 173 174 173 170 171 164  BMI 33.44 32.69 32.88 32.69 33.2 33.4 32.03   No flowsheet data found.

## 2016-10-27 MED FILL — ADDERALL XR 30 MG CAP SA: 30 | 30 days supply | Qty: 30 | Fill #0

## 2016-10-30 ENCOUNTER — Encounter (HOSPITAL_COMMUNITY): Payer: Self-pay

## 2016-10-30 ENCOUNTER — Observation Stay (HOSPITAL_COMMUNITY)
Admission: EM | Admit: 2016-10-30 | Discharge: 2016-10-31 | Disposition: A | Discharge status: Home / Self Care | Payer: 59 | Attending: Internal Medicine | Admitting: Internal Medicine

## 2016-10-30 ENCOUNTER — Emergency Department (HOSPITAL_COMMUNITY): Payer: 59

## 2016-10-30 DIAGNOSIS — E1149 Type 2 diabetes mellitus with other diabetic neurological complication: Secondary | ICD-10-CM | POA: Diagnosis present

## 2016-10-30 DIAGNOSIS — R29818 Other symptoms and signs involving the nervous system: Secondary | ICD-10-CM | POA: Diagnosis not present

## 2016-10-30 DIAGNOSIS — R269 Unspecified abnormalities of gait and mobility: Secondary | ICD-10-CM | POA: Diagnosis not present

## 2016-10-30 DIAGNOSIS — R7303 Prediabetes: Secondary | ICD-10-CM | POA: Diagnosis present

## 2016-10-30 DIAGNOSIS — I10 Essential (primary) hypertension: Secondary | ICD-10-CM | POA: Diagnosis present

## 2016-10-30 DIAGNOSIS — I639 Cerebral infarction, unspecified: Secondary | ICD-10-CM | POA: Diagnosis present

## 2016-10-30 DIAGNOSIS — Z79899 Other long term (current) drug therapy: Secondary | ICD-10-CM | POA: Insufficient documentation

## 2016-10-30 DIAGNOSIS — R531 Weakness: Secondary | ICD-10-CM | POA: Diagnosis not present

## 2016-10-30 DIAGNOSIS — G459 Transient cerebral ischemic attack, unspecified: Principal | ICD-10-CM | POA: Insufficient documentation

## 2016-10-30 DIAGNOSIS — R42 Dizziness and giddiness: Secondary | ICD-10-CM | POA: Diagnosis not present

## 2016-10-30 DIAGNOSIS — R2990 Unspecified symptoms and signs involving the nervous system: Secondary | ICD-10-CM | POA: Diagnosis present

## 2016-10-30 DIAGNOSIS — E785 Hyperlipidemia, unspecified: Secondary | ICD-10-CM | POA: Diagnosis present

## 2016-10-30 DIAGNOSIS — H532 Diplopia: Secondary | ICD-10-CM | POA: Diagnosis not present

## 2016-10-30 DIAGNOSIS — T50904A Poisoning by unspecified drugs, medicaments and biological substances, undetermined, initial encounter: Secondary | ICD-10-CM | POA: Diagnosis not present

## 2016-10-30 DIAGNOSIS — R29810 Facial weakness: Secondary | ICD-10-CM | POA: Diagnosis not present

## 2016-10-30 DIAGNOSIS — E1159 Type 2 diabetes mellitus with other circulatory complications: Secondary | ICD-10-CM | POA: Diagnosis present

## 2016-10-30 DIAGNOSIS — E1169 Type 2 diabetes mellitus with other specified complication: Secondary | ICD-10-CM | POA: Diagnosis present

## 2016-10-30 LAB — RAPID URINE DRUG SCREEN, HOSP PERFORMED
Amphetamines: POSITIVE — AB
Barbiturates: NOT DETECTED
Benzodiazepines: NOT DETECTED
COCAINE: NOT DETECTED
OPIATES: NOT DETECTED
TETRAHYDROCANNABINOL: NOT DETECTED

## 2016-10-30 LAB — URINALYSIS, ROUTINE W REFLEX MICROSCOPIC
Bilirubin Urine: NEGATIVE
GLUCOSE, UA: NEGATIVE mg/dL
Hgb urine dipstick: NEGATIVE
KETONES UR: NEGATIVE mg/dL
LEUKOCYTES UA: NEGATIVE
NITRITE: NEGATIVE
PH: 7 (ref 5.0–8.0)
Protein, ur: NEGATIVE mg/dL
SPECIFIC GRAVITY, URINE: 1.016 (ref 1.005–1.030)

## 2016-10-30 LAB — APTT: aPTT: 28 seconds (ref 24–36)

## 2016-10-30 LAB — COMPREHENSIVE METABOLIC PANEL
ALBUMIN: 4.3 g/dL (ref 3.5–5.0)
ALT: 26 U/L (ref 14–54)
ANION GAP: 7 (ref 5–15)
AST: 24 U/L (ref 15–41)
Alkaline Phosphatase: 80 U/L (ref 38–126)
BILIRUBIN TOTAL: 0.2 mg/dL — AB (ref 0.3–1.2)
BUN: 12 mg/dL (ref 6–20)
CALCIUM: 9.1 mg/dL (ref 8.9–10.3)
CO2: 30 mmol/L (ref 22–32)
Chloride: 97 mmol/L — ABNORMAL LOW (ref 101–111)
Creatinine, Ser: 0.8 mg/dL (ref 0.44–1.00)
GFR calc Af Amer: >60 mL/min (ref >60)
GFR calc non Af Amer: >60 mL/min (ref >60)
GLUCOSE: 135 mg/dL — AB (ref 65–99)
Potassium: 3.8 mmol/L (ref 3.5–5.1)
SODIUM: 134 mmol/L — AB (ref 135–145)
TOTAL PROTEIN: 7.6 g/dL (ref 6.5–8.1)

## 2016-10-30 LAB — DIFFERENTIAL
Basophils Absolute: 0 10*3/uL (ref 0.0–0.1)
Basophils Relative: 0 %
EOS ABS: 0.2 10*3/uL (ref 0.0–0.7)
EOS PCT: 2 %
LYMPHS ABS: 3.1 10*3/uL (ref 0.7–4.0)
Lymphocytes Relative: 34 %
MONO ABS: 0.6 10*3/uL (ref 0.1–1.0)
Monocytes Relative: 7 %
NEUTROS PCT: 57 %
Neutro Abs: 5.1 10*3/uL (ref 1.7–7.7)

## 2016-10-30 LAB — I-STAT CHEM 8, ED
BUN: 11 mg/dL (ref 6–20)
Calcium, Ion: 1.15 mmol/L (ref 1.15–1.40)
Chloride: 97 mmol/L — ABNORMAL LOW (ref 101–111)
Creatinine, Ser: 0.8 mg/dL (ref 0.44–1.00)
Glucose, Bld: 131 mg/dL — ABNORMAL HIGH (ref 65–99)
HEMATOCRIT: 40 % (ref 36.0–46.0)
HEMOGLOBIN: 13.6 g/dL (ref 12.0–15.0)
Potassium: 3.8 mmol/L (ref 3.5–5.1)
Sodium: 139 mmol/L (ref 135–145)
TCO2: 31 mmol/L (ref 0–100)

## 2016-10-30 LAB — ETHANOL: Alcohol, Ethyl (B): <5 mg/dL (ref <5)

## 2016-10-30 LAB — I-STAT TROPONIN, ED: Troponin i, poc: 0 ng/mL (ref 0.00–0.08)

## 2016-10-30 LAB — CBC
HCT: 40.7 % (ref 36.0–46.0)
Hemoglobin: 14 g/dL (ref 12.0–15.0)
MCH: 31.4 pg (ref 26.0–34.0)
MCHC: 34.4 g/dL (ref 30.0–36.0)
MCV: 91.3 fL (ref 78.0–100.0)
PLATELETS: 275 10*3/uL (ref 150–400)
RBC: 4.46 MIL/uL (ref 3.87–5.11)
RDW: 12.7 % (ref 11.5–15.5)
WBC: 9 10*3/uL (ref 4.0–10.5)

## 2016-10-30 LAB — PROTIME-INR
INR: 0.89
Prothrombin Time: 12.1 seconds (ref 11.4–15.2)

## 2016-10-30 MED ORDER — ASPIRIN 325 MG PO TABS
325.0000 mg | ORAL_TABLET | Freq: Once | ORAL | Status: AC
  Administered 2016-10-30: 325 mg via ORAL
  Filled 2016-10-30: qty 1

## 2016-10-30 MED ORDER — IOPAMIDOL (ISOVUE-370) INJECTION 76%
75.0000 mL | Freq: Once | INTRAVENOUS | Status: AC | PRN
  Administered 2016-10-30: 75 mL via INTRAVENOUS

## 2016-10-30 NOTE — ED Provider Notes (Addendum)
[place] DEPT Provider Note   CSN: [id number] Arrival date & time: 10/30/16  7846   An emergency department physician performed an initial assessment on this suspected stroke patient at 25.  History   Chief Complaint Chief Complaint  Patient presents with  . Code Stroke    HPI Carmen Mcintyre is a 51 y.o. female.  Patient brought in by EMS. Patient arrived as a code stroke patient. Code stroke was formally activated upon arrival. Patient was normal until [place]. At that time she had sudden onset of vision problems speech problems and left-sided weakness. Patient was hypertensive. No known history of hypertension. Patient denied headache. Upon arrival all symptoms had resolved except for the vision problem and the double vision. Speech was back to normal strength on the left side was back to normal.       Past Medical History:  Diagnosis Date  . Dyslipidemia   . Insomnia   . Urinary incontinence     Patient Active Problem List   Diagnosis Date Noted  . Ingrown toenail 09/07/2016  . Metabolic syndrome X 96/29/5284  . Hot flashes 01/03/2013  . Chronic fatigue disorder 10/04/2012  . Depression with anxiety 04/07/2011  . Seasonal allergies 09/26/2010  . CHRONIC FATIGUE SYNDROME 03/24/2010  . Prediabetes 11/16/2009  . Obesity (BMI 30.0-34.9) 07/01/2008  . Hyperlipidemia LDL goal <100 08/04/2007    Past Surgical History:  Procedure Laterality Date  . BREAST BIOPSY Left    benign  . CAESAREAN  1997/2003  . CHOLECYSTECTOMY  2005  . LEFT BREAST MASS REMOVED  1998  . RT LOWER LOBECTOMY SECONDARY IN INFECTION  2006    OB History    No data available       Home Medications    Prior to Admission medications   Medication Sig Start Date End Date Taking? Authorizing Provider  amphetamine-dextroamphetamine (ADDERALL XR) 30 MG 24 hr capsule Take 1 capsule (30 mg total) by mouth every morning. 10/23/16  Yes Fayrene Helper, MD  calcium-vitamin D 250-100 MG-UNIT  tablet Take 1 tablet by mouth 2 (two) times daily.   Yes [provider]  cetirizine (ZYRTEC HIVES RELIEF) 10 MG tablet Take 10 mg by mouth daily.     Yes [provider]  estradiol (CLIMARA - DOSED IN MG/24 HR) 0.05 mg/24hr patch Place 1 patch (0.05 mg total) onto the skin once a week. 06/13/16  Yes Cresenzo-Dishmon, Joaquim Lai, CNM  medroxyPROGESTERone (PROVERA) 10 MG tablet Take 1 tablet (10 mg total) by mouth daily. 06/13/16  Yes Cresenzo-Dishmon, Joaquim Lai, CNM  Multiple Vitamins-Minerals (MULTIVITAMIN WITH MINERALS) tablet Take 1 tablet by mouth daily.   Yes [provider]  venlafaxine XR (EFFEXOR-XR) 150 MG 24 hr capsule Take 1 capsule (150 mg total) by mouth daily with breakfast. 09/11/16  Yes Fayrene Helper, MD    Family History Family History  Problem Relation Age of Onset  . Diabetes Mother   . Arthritis Unknown     Social History Social History  Substance Use Topics  . Smoking status: Never Smoker  . Smokeless tobacco: Never Used  . Alcohol use Yes     Comment: occ. wine     Allergies   Hydrocodone   Review of Systems Review of Systems  Constitutional: Negative for fever.  HENT: Negative for congestion.   Eyes: Positive for visual disturbance. Negative for pain.  Respiratory: Negative for shortness of breath.   Cardiovascular: Negative for chest pain.  Gastrointestinal: Negative for abdominal pain.  Genitourinary: Negative  for dysuria.  Musculoskeletal: Negative for back pain.  Skin: Negative for rash.  Neurological: Positive for speech difficulty and weakness. Negative for headaches.  Hematological: Does not bruise/bleed easily.  Psychiatric/Behavioral: Negative for confusion.     Physical Exam Updated Vital Signs BP (!) 157/86   Pulse 95   Temp 98.4 F (36.9 C)   Resp (!) 21   Ht 1.549 m (5\' 1" )   Wt 74.8 kg (165 lb)   SpO2 100%   BMI 31.18 kg/m   Physical Exam  Constitutional: She appears well-developed and  well-nourished. No distress.  HENT:  Head: Normocephalic and atraumatic.  Mouth/Throat: Oropharynx is clear and moist.  Eyes: Pupils are equal, round, and reactive to light. Conjunctivae are normal.  Eyes not tracking together.  Neck: Normal range of motion. Neck supple.  Cardiovascular: Normal rate and regular rhythm.   Pulmonary/Chest: Effort normal and breath sounds normal. No respiratory distress.  Abdominal: Soft. Bowel sounds are normal. There is no tenderness.  Musculoskeletal: Normal range of motion.  Neurological: She is alert. No cranial nerve deficit or sensory deficit. She exhibits normal muscle tone. Coordination normal.  Skin: Skin is warm. No rash noted.  Nursing note and vitals reviewed.    ED Treatments / Results  Labs (all labs ordered are listed, but only abnormal results are displayed) Labs Reviewed  COMPREHENSIVE METABOLIC PANEL - Abnormal; Notable for the following:       Result Value   Sodium 134 (*)    Chloride 97 (*)    Glucose, Bld 135 (*)    Total Bilirubin 0.2 (*)    All other components within normal limits  RAPID URINE DRUG SCREEN, HOSP PERFORMED - Abnormal; Notable for the following:    Amphetamines POSITIVE (*)    All other components within normal limits  URINALYSIS, ROUTINE W REFLEX MICROSCOPIC - Abnormal; Notable for the following:    Color, Urine STRAW (*)    All other components within normal limits  I-STAT CHEM 8, ED - Abnormal; Notable for the following:    Chloride 97 (*)    Glucose, Bld 131 (*)    All other components within normal limits  ETHANOL  PROTIME-INR  APTT  CBC  DIFFERENTIAL  I-STAT TROPONIN, ED    EKG  EKG Interpretation  Date/Time:  Thursday October 30 2016 19:51:37 EDT Ventricular Rate:  98 PR Interval:    QRS Duration: 85 QT Interval:  376 QTC Calculation: 481 R Axis:   55 Text Interpretation:  Sinus rhythm Probable left atrial enlargement Confirmed by Fredia Sorrow [phone]) on 10/30/2016 8:00:45 PM        Radiology Ct Angio Head W/cm &/or Wo Cm  Result Date: 10/30/2016 CLINICAL DATA:  Resolved LEFT-sided weakness and diplopia. EXAM: CT ANGIOGRAPHY HEAD TECHNIQUE: Multidetector CT imaging of the head was performed using the standard protocol during bolus administration of intravenous contrast. Multiplanar CT image reconstructions and MIPs were obtained to evaluate the vascular anatomy. CONTRAST:  75 cc Isovue 370 COMPARISON:  CT HEAD October 30, 2016 at 1938 hours and MRI of the head October 30, 2016 at 2037 hours FINDINGS: ANTERIOR CIRCULATION: Patent cervical internal carotid arteries, petrous, cavernous and supra clinoid internal carotid arteries. Widely patent anterior communicating artery. Patent anterior and middle cerebral arteries. Minimal luminal irregularity LEFT M1 segment. No large vessel occlusion, significant stenosis, contrast extravasation or aneurysm. POSTERIOR CIRCULATION: RIGHT vertebral artery is dominant. Patent vertebral arteries, vertebrobasilar junction and basilar artery, as well as main branch vessels. Minimal luminal  irregularity vertebral arteries and basilar artery. Patent posterior cerebral arteries. Small RIGHT posterior communicating artery present. No large vessel occlusion, significant stenosis, contrast extravasation or aneurysm. VENOUS SINUSES: Major dural venous sinuses are patent though not tailored for evaluation on this angiographic examination. ANATOMIC VARIANTS: None. DELAYED PHASE: Not performed. MIP images reviewed. IMPRESSION: 1. No emergent large vessel occlusion or severe stenosis. 2. Minimal luminal irregularity secondary to atherosclerosis or artifact. Electronically Signed   By: Elon Alas M.D.   On: 10/30/2016 21:22   Mr Brain Wo Contrast (neuro Protocol)  Result Date: 10/30/2016 CLINICAL DATA:  Dizziness, diplopia, improving. EXAM: MRI HEAD WITHOUT CONTRAST TECHNIQUE: Multiplanar, multiecho pulse sequences of the brain and surrounding structures  were obtained without intravenous contrast. COMPARISON:  CT HEAD October 30, 2016 at 1938 hours FINDINGS: BRAIN: No reduced diffusion to suggest acute ischemia or hyperacute demyelination. No susceptibility artifact to suggest hemorrhage. The ventricles and sulci are normal for patient's age. A few scattered subcentimeter supratentorial white matter FLAIR T2 hyperintensities. No suspicious parenchymal signal, mass or mass effect. No abnormal extra-axial fluid collections. VASCULAR: Normal major intracranial vascular flow voids present at skull base. SKULL AND UPPER CERVICAL SPINE: No abnormal sellar expansion. No suspicious calvarial bone marrow signal. Craniocervical junction maintained. SINUSES/ORBITS: Trace paranasal sinus mucosal thickening and mastoid effusions. The included ocular globes and orbital contents are non-suspicious. OTHER: None. IMPRESSION: 1. No acute intracranial process. 2. Minimal white matter changes seen with chronic small vessel ischemic disease and migraine ; atypical distribution for demyelination. Electronically Signed   By: Elon Alas M.D.   On: 10/30/2016 20:57   Ct Head Code Stroke Wo Contrast  Result Date: 10/30/2016 CLINICAL DATA:  Code stroke. Blurry vision and diplopia for 45 minutes. EXAM: CT HEAD WITHOUT CONTRAST TECHNIQUE: Contiguous axial images were obtained from the base of the skull through the vertex without intravenous contrast. COMPARISON:  None. FINDINGS: BRAIN: No intraparenchymal hemorrhage, mass effect nor midline shift. The ventricles and sulci are normal. No acute large vascular territory infarcts. No abnormal extra-axial fluid collections. Basal cisterns are patent. VASCULAR: Unremarkable. SKULL/SOFT TISSUES: No skull fracture. No significant soft tissue swelling. ORBITS/SINUSES: The included ocular globes and orbital contents are normal.The mastoid aircells and included paranasal sinuses are well-aerated. OTHER: None. ASPECTS [facility] Stroke Program  Early CT Score) - Ganglionic level infarction (caudate, lentiform nuclei, internal capsule, insula, M1-M3 cortex): 7 - Supraganglionic infarction (M4-M6 cortex): 3 Total score (0-10 with 10 being normal): 10 IMPRESSION: 1. Negative noncontrast CT HEAD. 2. ASPECTS is 10. Critical Value/emergent results were called by telephone at the time of interpretation on 10/30/2016 at 7:55pm to Dr. Fredia Sorrow , who verbally acknowledged these results. Electronically Signed   By: Elon Alas M.D.   On: 10/30/2016 19:58    Procedures Procedures (including critical care time)  CRITICAL CARE Performed by: Fredia Sorrow Total critical care time: 30 minutes Critical care time was exclusive of separately billable procedures and treating other patients. Critical care was necessary to treat or prevent imminent or life-threatening deterioration. Critical care was time spent personally by me on the following activities: development of treatment plan with patient and/or surrogate as well as nursing, discussions with consultants, evaluation of patient's response to treatment, examination of patient, obtaining history from patient or surrogate, ordering and performing treatments and interventions, ordering and review of laboratory studies, ordering and review of radiographic studies, pulse oximetry and re-evaluation of patient's condition.   Medications Ordered in ED Medications  aspirin tablet 325 mg (325 mg Oral  Given 10/30/16 2118)  iopamidol (ISOVUE-370) 76 % injection 75 mL (75 mLs Intravenous Contrast Given 10/30/16 2057)     Initial Impression / Assessment and Plan / ED Course  I have reviewed the triage vital signs and the nursing notes.  Pertinent labs & imaging results that were available during my care of the patient were reviewed by me and considered in my medical decision making (see chart for details).    Patient arrived as a code stroke. Last normal at [place]. Code stroke was activated. Head  CT was negative. Patient was interviewed by neurology. By telemetry. Patient's symptoms were very concerning to them. They recommended MRI as well as CT angios. They were ordered. No acute findings on either 1 to suggest stroke or neoplasm however there was some question of some demyelination abnormalities. Certainly no evidence of any large vessel problem. Patient still with the double vision. I still not tracking properly. All other symptoms have resolved. Blood pressure here has been the slightly elevated.  Since symptoms are still present Arnette Norris patients can require admission and neurology evaluation. Clearly we have ruled out acute CVA. But the acuity and severity of her symptoms are difficult to explain. We'll discuss with hospitalist.   Final Clinical Impressions(s) / ED Diagnoses   Final diagnoses:  Transient cerebral ischemia, unspecified type  Diplopia  Essential hypertension    New Prescriptions New Prescriptions   No medications on file     Fredia Sorrow, MD 10/30/16 2150    Fredia Sorrow, MD 10/30/16 2206  Addendum:.  Spoke with neurology, telemetry service, went over the MRI results and patient's symptoms both on presentation and what we have residual with the disconjugate gaze. They felt that all her symptoms initially were posterior brain type symptoms. The fact that MRI was negative does not completely rule out a small brainstem infarct. They feel strongly that patient needs to be admitted as a stroke rule out. Obviously not a candidate for TPA. We'll recontact hospitalist.  Neurology will writing a note supporting this concern.     Fredia Sorrow, MD 10/30/16 [phone]

## 2016-10-30 NOTE — ED Notes (Signed)
Pt transported to MRI/CTA

## 2016-10-30 NOTE — ED Triage Notes (Signed)
Pt states she started feeling dizzy and had double vision and some slurred speech that started at 1840, had weakness to the left arm and left leg that resolved within approx 30 minutes per patient.  Pt is awake, alert and oriented at this time.

## 2016-10-30 NOTE — ED Triage Notes (Addendum)
Pt had quick eval by dr Rogene Houston on arrival by [facility] and pt sent directly to ct

## 2016-10-30 NOTE — ED Notes (Signed)
Returned from CT.

## 2016-10-31 ENCOUNTER — Observation Stay (HOSPITAL_COMMUNITY): Payer: 59

## 2016-10-31 ENCOUNTER — Observation Stay (HOSPITAL_BASED_OUTPATIENT_CLINIC_OR_DEPARTMENT_OTHER): Payer: 59

## 2016-10-31 ENCOUNTER — Encounter (HOSPITAL_COMMUNITY): Payer: Self-pay | Admitting: *Deleted

## 2016-10-31 DIAGNOSIS — G463 Brain stem stroke syndrome: Secondary | ICD-10-CM | POA: Diagnosis not present

## 2016-10-31 DIAGNOSIS — I10 Essential (primary) hypertension: Secondary | ICD-10-CM | POA: Diagnosis not present

## 2016-10-31 DIAGNOSIS — E785 Hyperlipidemia, unspecified: Secondary | ICD-10-CM

## 2016-10-31 DIAGNOSIS — R7303 Prediabetes: Secondary | ICD-10-CM

## 2016-10-31 DIAGNOSIS — I639 Cerebral infarction, unspecified: Secondary | ICD-10-CM

## 2016-10-31 DIAGNOSIS — I6523 Occlusion and stenosis of bilateral carotid arteries: Secondary | ICD-10-CM | POA: Diagnosis not present

## 2016-10-31 DIAGNOSIS — R270 Ataxia, unspecified: Secondary | ICD-10-CM | POA: Diagnosis not present

## 2016-10-31 DIAGNOSIS — G459 Transient cerebral ischemic attack, unspecified: Secondary | ICD-10-CM | POA: Diagnosis not present

## 2016-10-31 DIAGNOSIS — Z79899 Other long term (current) drug therapy: Secondary | ICD-10-CM | POA: Diagnosis not present

## 2016-10-31 DIAGNOSIS — R531 Weakness: Secondary | ICD-10-CM | POA: Diagnosis not present

## 2016-10-31 DIAGNOSIS — H532 Diplopia: Secondary | ICD-10-CM

## 2016-10-31 LAB — ECHOCARDIOGRAM COMPLETE
CHL CUP DOP CALC LVOT VTI: 20.3 cm
CHL CUP STROKE VOLUME: 28 mL
E/e' ratio: 8.19
EWDT: 222 ms
FS: 37 % (ref 28–44)
Height: 61 in
IV/PV OW: 1.01
LA ID, A-P, ES: 34 mm
LA diam end sys: 34 mm
LA diam index: 1.86 cm/m2
LA vol A4C: 28 ml
LA vol: 26.9 mL
LAVOLIN: 14.7 mL/m2
LDCA: 3.14 cm2
LV E/e' medial: 8.19
LV E/e'average: 8.19
LV TDI E'LATERAL: 10.4
LV dias vol index: 24 mL/m2
LV dias vol: 44 mL — AB (ref 46–106)
LV sys vol index: 9 mL/m2
LV sys vol: 16 mL
LVELAT: 10.4 cm/s
LVOT peak grad rest: 4 mmHg
LVOT peak vel: 102 cm/s
LVOTD: 20 mm
LVOTSV: 64 mL
MV Dec: 222
MV pk E vel: 85.2 m/s
MVPG: 3 mmHg
MVPKAVEL: 83.3 m/s
PW: 9.02 mm — AB (ref 0.6–1.1)
RV LATERAL S' VELOCITY: 11.4 cm/s
Simpson's disk: 64
TAPSE: 16.3 mm
TDI e' medial: 8.16
Weight: 2640 oz

## 2016-10-31 LAB — HEMOGLOBIN A1C
Hgb A1c MFr Bld: 6 % — ABNORMAL HIGH (ref 4.8–5.6)
Mean Plasma Glucose: 125.5 mg/dL

## 2016-10-31 LAB — LIPID PANEL
Cholesterol: 171 mg/dL (ref 0–200)
HDL: 45 mg/dL (ref >40)
LDL CALC: 108 mg/dL — AB (ref 0–99)
TRIGLYCERIDES: 89 mg/dL (ref <150)
Total CHOL/HDL Ratio: 3.8 RATIO
VLDL: 18 mg/dL (ref 0–40)

## 2016-10-31 MED ORDER — ACETAMINOPHEN 160 MG/5ML PO SOLN: 650.0000 mg | ORAL | Status: DC | PRN

## 2016-10-31 MED ORDER — STROKE: EARLY STAGES OF RECOVERY BOOK
Freq: Once | Status: DC
  Filled 2016-10-31: qty 1

## 2016-10-31 MED ORDER — ASPIRIN 81 MG PO TABS: 81.0000 mg | ORAL_TABLET | Freq: Every day | ORAL | 1 refills | Status: DC

## 2016-10-31 MED ORDER — VENLAFAXINE HCL ER 75 MG PO CP24
150.0000 mg | ORAL_CAPSULE | Freq: Every day | ORAL | Status: DC
  Filled 2016-10-31: qty 2

## 2016-10-31 MED ORDER — ATORVASTATIN CALCIUM 20 MG PO TABS: 20.0000 mg | ORAL_TABLET | Freq: Every day | ORAL | 11 refills | Status: DC

## 2016-10-31 MED ORDER — ASPIRIN 300 MG RE SUPP: 300.0000 mg | Freq: Every day | RECTAL | Status: DC

## 2016-10-31 MED ORDER — ACETAMINOPHEN 325 MG PO TABS: 650.0000 mg | ORAL_TABLET | ORAL | Status: DC | PRN

## 2016-10-31 MED ORDER — ENOXAPARIN SODIUM 40 MG/0.4ML ~~LOC~~ SOLN
40.0000 mg | SUBCUTANEOUS | Status: DC
  Administered 2016-10-31: 40 mg via SUBCUTANEOUS
  Filled 2016-10-31: qty 0.4

## 2016-10-31 MED ORDER — ACETAMINOPHEN 650 MG RE SUPP: 650.0000 mg | RECTAL | Status: DC | PRN

## 2016-10-31 MED ORDER — SODIUM CHLORIDE 0.9 % IV SOLN: INTRAVENOUS | Status: DC

## 2016-10-31 MED ORDER — LISINOPRIL 10 MG PO TABS: 10.0000 mg | ORAL_TABLET | Freq: Every day | ORAL | 11 refills | Status: DC

## 2016-10-31 MED ORDER — SENNOSIDES-DOCUSATE SODIUM 8.6-50 MG PO TABS: 1.0000 | ORAL_TABLET | Freq: Every evening | ORAL | Status: DC | PRN

## 2016-10-31 MED ORDER — ASPIRIN 325 MG PO TABS
325.0000 mg | ORAL_TABLET | Freq: Every day | ORAL | Status: DC
  Administered 2016-10-31: 325 mg via ORAL
  Filled 2016-10-31: qty 1

## 2016-10-31 MED ORDER — ASPIRIN 81 MG PO TABS: 81.0000 mg | ORAL_TABLET | Freq: Every day | ORAL | 1 refills | Status: AC

## 2016-10-31 MED ORDER — AMPHETAMINE-DEXTROAMPHET ER 30 MG PO CP24
30.0000 mg | ORAL_CAPSULE | ORAL | Status: DC
  Administered 2016-10-31: 30 mg via ORAL
  Filled 2016-10-31: qty 1

## 2016-10-31 MED FILL — LISINOPRIL 10 MG TABLET: 10 | 30 days supply | Qty: 30 | Fill #0

## 2016-10-31 MED FILL — ATORVASTATIN 20 MG TABLET: 20 | 30 days supply | Qty: 30 | Fill #0

## 2016-10-31 MED FILL — ASPIRIN ADULT LOW STRENGTH: 81 | 30 days supply | Qty: 30 | Fill #0

## 2016-10-31 NOTE — Progress Notes (Signed)
OT Cancellation Note  Patient Details Name: Carmen Mcintyre MRN: [id number] DOB: [birth year]   Cancelled Treatment:    Reason Eval/Treat Not Completed: OT screened, no needs identified, will sign off. Chart reviewed, pt screened for OT needs. Pt symptoms have resolved, no vision deficits at this time. Pt is at baseline independence with B/ADL completion. No further OT services required at this time.   [name], OTR/L  [phone] 10/31/2016, 3:17 PM

## 2016-10-31 NOTE — Progress Notes (Signed)
*  PRELIMINARY RESULTS* Echocardiogram 2D Echocardiogram has been performed.  Samuel Germany 10/31/2016, 10:53 AM

## 2016-10-31 NOTE — Progress Notes (Signed)
SLP Cancellation Note  Patient Details Name: Carmen Mcintyre MRN: [id number] DOB: June 13, 1965   Cancelled treatment:        ST screened; chart reviewed, Pt screened for ST needs and all symptoms have resolved. No needs identified. ST to sign off. Thank you,  Amelia H. Roddie Mc, CCC-SLP Speech Language Pathologist    Wende Bushy 10/31/2016, 4:36 PM

## 2016-10-31 NOTE — Progress Notes (Signed)
   10/31/16 0908  PT Time Calculation  PT Start Time (ACUTE ONLY) 0835  PT Stop Time (ACUTE ONLY) 0858  PT Time Calculation (min) (ACUTE ONLY) 23 min  PT G-Codes **NOT FOR INPATIENT CLASS**  Functional Assessment Tool Used AM-PAC 6 Clicks Basic Mobility  Functional Limitation Mobility: Walking and moving around  Mobility: Walking and Moving Around Current Status (O9629) CH  Mobility: Walking and Moving Around Goal Status (B2841) CH  Mobility: Walking and Moving Around Discharge Status (L2440) [place]  PT General Charges  $$ ACUTE PT VISIT 1 Visit  PT Evaluation  $PT Eval Low Complexity 1 Low  PT Treatments  $Gait Training 8-22 mins    3:13 PM, 10/31/16 Lonell Grandchild, MPT Physical Therapist with [facility] [phone] office [phone] mobile phone

## 2016-10-31 NOTE — Evaluation (Signed)
Physical Therapy Evaluation Patient Details Name: Carmen Mcintyre MRN: [id number] DOB: Aug 17, 1965 Today's Date: 10/31/2016   History of Present Illness   Carmen Mcintyre  is a 51 y.o. female, With history of depression/anxiety, chronic fatigue syndrome came to hospital with sudden onset of diplopia, slurred speech and left-sided weakness which started around 6:40 PM. Patient says that slurred speech and left-sided weakness resolved upon arrival except for double vision.  Clinical Impression  Patient had no c/o of numbness/tingling, no deficits in strength and coordination and presently functioning at or near baseline.  Patient Independent gait without assistive device x 150 feet.  Patient discharged to care of nursing for ambulation daily as tolerated.    Follow Up Recommendations Supervision for mobility/OOB    Equipment Recommendations  None recommended by PT    Recommendations for Other Services       Precautions / Restrictions Precautions Precautions: None Restrictions Weight Bearing Restrictions: No      Mobility  Bed Mobility Overal bed mobility: Independent                Transfers Overall transfer level: Independent                  Ambulation/Gait Ambulation/Gait assistance: Independent Ambulation Distance (Feet): 150 Feet Assistive device: None Gait Pattern/deviations: WFL(Within Functional Limits)   Gait velocity interpretation: at or above normal speed for age/gender    Stairs            Wheelchair Mobility    Modified Rankin (Stroke Patients Only)       Balance Overall balance assessment: Independent                                           Pertinent Vitals/Pain Pain Assessment: No/denies pain    Home Living Family/patient expects to be discharged to:: Private residence Living Arrangements: Spouse/significant other Available Help at Discharge: Family Type of Home: House       Home Layout: One  level Home Equipment: None      Prior Function Level of Independence: Independent               Hand Dominance        Extremity/Trunk Assessment   Upper Extremity Assessment Upper Extremity Assessment: Overall WFL for tasks assessed    Lower Extremity Assessment Lower Extremity Assessment: Overall WFL for tasks assessed    Cervical / Trunk Assessment Cervical / Trunk Assessment: Normal  Communication   Communication: No difficulties  Cognition Arousal/Alertness: Awake/alert Behavior During Therapy: WFL for tasks assessed/performed Overall Cognitive Status: Within Functional Limits for tasks assessed                                        General Comments      Exercises     Assessment/Plan    PT Assessment Patent does not need any further PT services  PT Problem List         PT Treatment Interventions      PT Goals (Current goals can be found in the Care Plan section)  Acute Rehab PT Goals Patient Stated Goal: Return home with spouse to assist PT Goal Formulation: With patient/family Time For Goal Achievement: 10/31/16 Potential to Achieve Goals: Good    Frequency     Barriers  to discharge        Co-evaluation               AM-PAC PT "6 Clicks" Daily Activity  Outcome Measure Difficulty turning over in bed (including adjusting bedclothes, sheets and blankets)?: None Difficulty moving from lying on back to sitting on the side of the bed? : None Difficulty sitting down on and standing up from a chair with arms (e.g., wheelchair, bedside commode, etc,.)?: None Help needed moving to and from a bed to chair (including a wheelchair)?: None Help needed walking in hospital room?: None Help needed climbing 3-5 steps with a railing? : None 6 Click Score: 24    End of Session   Activity Tolerance: Patient tolerated treatment well Patient left: with family/visitor present;with call bell/phone within reach (left standing to go to  bathroom with spouse in room)   PT Visit Diagnosis: Other abnormalities of gait and mobility (R26.89)    Time: 1610-9604 PT Time Calculation (min) (ACUTE ONLY): 23 min   Charges:   PT Evaluation $PT Eval Low Complexity: 1 Low PT Treatments $Gait Training: 8-22 mins   PT G Codes:   PT G-Codes **NOT FOR INPATIENT CLASS** Functional Limitation:  (Patient at baseline)    9:13 AM, 10/31/16 Lonell Grandchild, MPT Physical Therapist with [facility] [phone] office [phone] mobile phone

## 2016-10-31 NOTE — H&P (Addendum)
TRH H&P    Patient Demographics:    Carmen Mcintyre, is a 51 y.o. female  MRN: [id number]  DOB - 01-06-1966  Admit Date - 10/30/2016  Referring MD/NP/PA: Dr. Bobby Rumpf  Outpatient Primary MD for the patient is Fayrene Helper, MD  Patient coming from: Home  Chief Complaint  Patient presents with  . Code Stroke      HPI:    Carmen Mcintyre  is a 51 y.o. female, With history of depression/anxiety, chronic fatigue syndrome came to hospital with sudden onset of diplopia, slurred speech and left-sided weakness which started around 6:40 PM. Patient says that slurred speech and left-sided weakness resolved upon arrival except for double vision. Patient does not have history of stroke/seizure/migraine. She was initially seen by tele neurology, MRI brain showed no acute stroke. CT angiogram head and neck showed no emergent large vessel occlusion or severe stenosis.   She denies nausea, vomiting or diarrhea. No chest pain or shortness of breath. No fever. No dysuria. MRI results for again discussed by ED physician with neurology( tele). And he recommended that patient should be made for stroke workup as she can have a small brainstem infarct which is not seen on MRI brain.    Review of systems:      All other systems reviewed and are negative.   With Past History of the following :    Past Medical History:  Diagnosis Date  . Dyslipidemia   . Insomnia   . Urinary incontinence       Past Surgical History:  Procedure Laterality Date  . BREAST BIOPSY Left    benign  . CAESAREAN  1997/2003  . CHOLECYSTECTOMY  2005  . LEFT BREAST MASS REMOVED  1998  . RT LOWER LOBECTOMY SECONDARY IN INFECTION  2006      Social History:      Social History  Substance Use Topics  . Smoking status: Never Smoker  . Smokeless tobacco: Never Used  . Alcohol use Yes     Comment: occ. wine       Family History :      Family History  Problem Relation Age of Onset  . Diabetes Mother   . Arthritis Unknown       Home Medications:   Prior to Admission medications   Medication Sig Start Date End Date Taking? Authorizing Provider  amphetamine-dextroamphetamine (ADDERALL XR) 30 MG 24 hr capsule Take 1 capsule (30 mg total) by mouth every morning. 10/23/16  Yes Fayrene Helper, MD  calcium-vitamin D 250-100 MG-UNIT tablet Take 1 tablet by mouth 2 (two) times daily.   Yes [provider]  cetirizine (ZYRTEC HIVES RELIEF) 10 MG tablet Take 10 mg by mouth daily.     Yes [provider]  estradiol (CLIMARA - DOSED IN MG/24 HR) 0.05 mg/24hr patch Place 1 patch (0.05 mg total) onto the skin once a week. 06/13/16  Yes Cresenzo-Dishmon, Joaquim Lai, CNM  medroxyPROGESTERone (PROVERA) 10 MG tablet Take 1 tablet (10 mg total) by mouth daily. 06/13/16  Yes Cresenzo-Dishmon, Joaquim Lai, CNM  Multiple Vitamins-Minerals (MULTIVITAMIN WITH MINERALS) tablet Take 1 tablet by mouth daily.   Yes [provider]  venlafaxine XR (EFFEXOR-XR) 150 MG 24 hr capsule Take 1 capsule (150 mg total) by mouth daily with breakfast. 09/11/16  Yes Fayrene Helper, MD     Allergies:     Allergies  Allergen Reactions  . Hydrocodone     nausea     Physical Exam:   Vitals  Blood pressure 137/75, pulse 79, temperature 98.4 F (36.9 C), resp. rate 19, height 5\' 1"  (1.549 m), weight 74.8 kg (165 lb), SpO2 97 %.  1.  General: Appears in no acute distress  2. Psychiatric:  Intact judgement and  insight, awake alert, oriented x 3.  3. Neurologic:  Mental Status:  Alert. Speech normal, no dysarthria.  Oriented x 3  Cranial Nerves:  II:  Visual fields grossly intact.  III/IV/VI:  Extraocular movements intact. Pupils reactive bilaterally. No ptosis V/VII:  No  facial droop. facial light touch sensation normal bilaterally.  VIII:  Grossly intact.  IX/X:  Grossly normal gag.  XI:  Chin  turning and  shoulder shrug normal bilaterally XII:  Midline tongue extension normal.  Motor:  RUE  5/5 hand grip and 5/5 biceps/triceps strength.  LUE  Hand grip 5/5. 5/5 RLE and 5-/5 LLE  Pronator drift absent  Sensory:  Light touch intact throughout, bilaterally  DTRs:  3+ in the upper extremities, 3+ in lower extremities Plantars:  downgoing on the right and left Cerebellar:  Finger-to-nose intact with the LUE and RUE  Heel-to-shin intact in the BLE's    4. Eyes :  anicteric sclerae, moist conjunctivae with no lid lag. PERRLA.  5. ENMT:  Oropharynx clear with moist mucous membranes and good dentition  6. Neck:  supple, no cervical lymphadenopathy appriciated, No thyromegaly  7. Respiratory : Normal respiratory effort, good air movement bilaterally,clear to  auscultation bilaterally  8. Cardiovascular : RRR, no gallops, rubs or murmurs, no leg edema  9. Gastrointestinal:  Positive bowel sounds, abdomen soft, non-tender to palpation,no hepatosplenomegaly, no rigidity or guarding       10. Skin:  No cyanosis, normal texture and turgor, no rash, lesions or ulcers  11.Musculoskeletal:  Good muscle tone,  joints appear normal , no effusions,  normal range of motion    Data Review:    CBC  Recent Labs Lab 10/30/16 1940 10/30/16 2003  WBC 9.0  --   HGB 14.0 13.6  HCT 40.7 40.0  PLT 275  --   MCV 91.3  --   MCH 31.4  --   MCHC 34.4  --   RDW 12.7  --   LYMPHSABS 3.1  --   MONOABS 0.6  --   EOSABS 0.2  --   BASOSABS 0.0  --    ------------------------------------------------------------------------------------------------------------------  Chemistries   Recent Labs Lab 10/30/16 1940 10/30/16 2003  NA 134* 139  K 3.8 3.8  CL 97* 97*  CO2 30  --   GLUCOSE 135* 131*  BUN 12 11  CREATININE 0.80 0.80  CALCIUM 9.1  --   AST 24  --   ALT 26  --   ALKPHOS 80  --   BILITOT 0.2*  --     ------------------------------------------------------------------------------------------------------------------  ------------------------------------------------------------------------------------------------------------------ GFR: Estimated Creatinine Clearance: 77 mL/min (by C-G formula based on SCr of 0.8 mg/dL). Liver Function Tests:  Recent Labs Lab 10/30/16 1940  AST 24  ALT 26  ALKPHOS 80  BILITOT 0.2*  PROT 7.6  ALBUMIN 4.3   No results for input(s): LIPASE, AMYLASE in the last 168 hours. No results for input(s): AMMONIA in the last 168 hours. Coagulation Profile:  Recent Labs Lab 10/30/16 1940  INR 0.89    --------------------------------------------------------------------------------------------------------------- Urine analysis:    Component Value Date/Time   COLORURINE STRAW (A) 10/30/2016 2115   APPEARANCEUR CLEAR 10/30/2016 2115   LABSPEC 1.016 10/30/2016 2115   PHURINE 7.0 10/30/2016 2115   GLUCOSEU NEGATIVE 10/30/2016 2115   HGBUR NEGATIVE 10/30/2016 2115   HGBUR small 01/30/2009 1109   BILIRUBINUR NEGATIVE 10/30/2016 2115   KETONESUR NEGATIVE 10/30/2016 2115   PROTEINUR NEGATIVE 10/30/2016 2115   UROBILINOGEN 0.2 01/30/2009 1109   NITRITE NEGATIVE 10/30/2016 2115   LEUKOCYTESUR NEGATIVE 10/30/2016 2115      Imaging Results:    Ct Angio Head W/cm &/or Wo Cm  Result Date: 10/30/2016 CLINICAL DATA:  Resolved LEFT-sided weakness and diplopia. EXAM: CT ANGIOGRAPHY HEAD TECHNIQUE: Multidetector CT imaging of the head was performed using the standard protocol during bolus administration of intravenous contrast. Multiplanar CT image reconstructions and MIPs were obtained to evaluate the vascular anatomy. CONTRAST:  75 cc Isovue 370 COMPARISON:  CT HEAD October 30, 2016 at 1938 hours and MRI of the head October 30, 2016 at 2037 hours FINDINGS: ANTERIOR CIRCULATION: Patent cervical internal carotid arteries, petrous, cavernous and supra clinoid  internal carotid arteries. Widely patent anterior communicating artery. Patent anterior and middle cerebral arteries. Minimal luminal irregularity LEFT M1 segment. No large vessel occlusion, significant stenosis, contrast extravasation or aneurysm. POSTERIOR CIRCULATION: RIGHT vertebral artery is dominant. Patent vertebral arteries, vertebrobasilar junction and basilar artery, as well as main branch vessels. Minimal luminal irregularity vertebral arteries and basilar artery. Patent posterior cerebral arteries. Small RIGHT posterior communicating artery present. No large vessel occlusion, significant stenosis, contrast extravasation or aneurysm. VENOUS SINUSES: Major dural venous sinuses are patent though not tailored for evaluation on this angiographic examination. ANATOMIC VARIANTS: None. DELAYED PHASE: Not performed. MIP images reviewed. IMPRESSION: 1. No emergent large vessel occlusion or severe stenosis. 2. Minimal luminal irregularity secondary to atherosclerosis or artifact. Electronically Signed   By: Elon Alas M.D.   On: 10/30/2016 21:22   Mr Brain Wo Contrast (neuro Protocol)  Result Date: 10/30/2016 CLINICAL DATA:  Dizziness, diplopia, improving. EXAM: MRI HEAD WITHOUT CONTRAST TECHNIQUE: Multiplanar, multiecho pulse sequences of the brain and surrounding structures were obtained without intravenous contrast. COMPARISON:  CT HEAD October 30, 2016 at 1938 hours FINDINGS: BRAIN: No reduced diffusion to suggest acute ischemia or hyperacute demyelination. No susceptibility artifact to suggest hemorrhage. The ventricles and sulci are normal for patient's age. A few scattered subcentimeter supratentorial white matter FLAIR T2 hyperintensities. No suspicious parenchymal signal, mass or mass effect. No abnormal extra-axial fluid collections. VASCULAR: Normal major intracranial vascular flow voids present at skull base. SKULL AND UPPER CERVICAL SPINE: No abnormal sellar expansion. No suspicious calvarial  bone marrow signal. Craniocervical junction maintained. SINUSES/ORBITS: Trace paranasal sinus mucosal thickening and mastoid effusions. The included ocular globes and orbital contents are non-suspicious. OTHER: None. IMPRESSION: 1. No acute intracranial process. 2. Minimal white matter changes seen with chronic small vessel ischemic disease and migraine ; atypical distribution for demyelination. Electronically Signed   By: Elon Alas M.D.   On: 10/30/2016 20:57   Ct Head Code Stroke Wo Contrast  Result Date: 10/30/2016 CLINICAL DATA:  Code stroke. Blurry vision and diplopia for 45 minutes. EXAM: CT HEAD WITHOUT CONTRAST TECHNIQUE: Contiguous axial images were obtained from the base of the  skull through the vertex without intravenous contrast. COMPARISON:  None. FINDINGS: BRAIN: No intraparenchymal hemorrhage, mass effect nor midline shift. The ventricles and sulci are normal. No acute large vascular territory infarcts. No abnormal extra-axial fluid collections. Basal cisterns are patent. VASCULAR: Unremarkable. SKULL/SOFT TISSUES: No skull fracture. No significant soft tissue swelling. ORBITS/SINUSES: The included ocular globes and orbital contents are normal.The mastoid aircells and included paranasal sinuses are well-aerated. OTHER: None. ASPECTS [facility] Stroke Program Early CT Score) - Ganglionic level infarction (caudate, lentiform nuclei, internal capsule, insula, M1-M3 cortex): 7 - Supraganglionic infarction (M4-M6 cortex): 3 Total score (0-10 with 10 being normal): 10 IMPRESSION: 1. Negative noncontrast CT HEAD. 2. ASPECTS is 10. Critical Value/emergent results were called by telephone at the time of interpretation on 10/30/2016 at 7:55pm to Dr. Fredia Sorrow , who verbally acknowledged these results. Electronically Signed   By: Elon Alas M.D.   On: 10/30/2016 19:58    My personal review of EKG: Rhythm NSR   Assessment & Plan:    Active Problems:   Stroke (cerebrum)  ([place])   1. 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 Continue aspirin to 25 mg by mouth daily. Check hemoglobin A1c, lipid profile in a.m. Patient already had MRI brain and CT angiogram head . Echocardiogram and carotid Dopplers in a.m. 2. Anxiety/depression- continue venlafaxine.    DVT Prophylaxis-   Lovenox   AM Labs Ordered, also please review Full Orders  Family Communication: Admission, patients condition and plan of care including tests being ordered have been discussed with the patient  who indicate understanding and agree with the plan and Code Status.  Code Status:  Full code  Admission status: Observation    Time spent in minutes : 60 minutes   Concettina Leth S M.D on 10/31/2016 at 12:39 AM  Between 7am to 7pm - Pager - [phone]. After 7pm go to [email or web address] - password [facility]  [facility] - Office  [phone]

## 2016-10-31 NOTE — Consult Note (Signed)
[place] A. Merlene Laughter, MD     [email or web address]          Carmen Mcintyre is an 51 y.o. female.   ASSESSMENT/PLAN: 1. Focal neurological symptoms which seems most consistent with acute ischemic stroke despite negative imaging: This is likely a case of MRI negative small stroke involving the brainstem which rare but is seen from time to time. She does seem to have increased white matter lesions are concerning. I think we should keep the possibility of demyelinating disease a at hand. Consequently, we should repeat her imaging about 12 months from now to assess stability.  Other potential etiologies includes complicated migraine headache/migraine with aura, seizures and psychosomatic disorders. I think these are unlikely however. We will approach this from a stroke etiology however. We did discuss the use of uncoated aspirin 81 mg.  low-dose statin seems reasonable. Blood sugar slightly elevated and this will need to be watched. Weight reduction is also suggested. She can follow-up in the office with her primary care provider and also with me in 2 months.      This is a 51 year old white female who presents with the acute onset of vertical diplopia associated with the onset of left hemiparesis and dysarthria. She reports that he dysarthria and hemiparesis lasted for about 45 minutes. However, she indicated that the vertical diplopia persisted for about 10 hours. She does not report having any sensory symptoms. There is no numbness or tingling. She reports that she did have a right-sided headache with the episode. She does not have a baseline history of headaches. No loss of consciousness is reported. No chest pain or shortness of breath. She reports that she did not have any dizziness. She is back to baseline.    GENERAL: This pleasant obese female in no acute distress.   HEENT:  This is normal.  ABDOMEN: soft  EXTREMITIES: No edema   BACK: normal   SKIN: Normal by  inspection.    MENTAL STATUS: Alert and oriented. This includes orientation to age and month. Speech, language and cognition are generally intact. Judgment and insight normal.   CRANIAL NERVES: Pupils are equal, round and reactive to light and accomodation; extra ocular movements are full, there is no significant nystagmus; visual fields are full; upper and lower facial muscles are normal in strength and symmetric, there is no flattening of the nasolabial folds; tongue is midline; uvula is midline; shoulder elevation is normal.  MOTOR: Normal tone, bulk and strength; no pronator drift.  COORDINATION: Left finger to nose is normal, right finger to nose is normal, No rest tremor; no intention tremor; no postural tremor; no bradykinesia.  REFLEXES: Deep tendon reflexes are symmetrical and normal. Babinski reflexes are flexor bilaterally.   SENSATION: Normal to light touch, temperature, and pinprick. No extinction to double simultaneous tactile or visual stimulation.  GAIT: Normal.   NIH stroke scale 0   Blood pressure 137/84, pulse 93, temperature 97.6 F (36.4 C), temperature source Oral, resp. rate 19, height _0  (1.549 m), weight 165 lb (74.8 kg), SpO2 100 %.  Past Medical History:  Diagnosis Date  . Dyslipidemia   . Insomnia   . Urinary incontinence     Past Surgical History:  Procedure Laterality Date  . BREAST BIOPSY Left    benign  . CAESAREAN  1997/2003  . CHOLECYSTECTOMY  2005  . LEFT BREAST MASS REMOVED  1998  . RT LOWER LOBECTOMY SECONDARY IN INFECTION  2006    Family History  Problem Relation Age of Onset  . Diabetes Mother   . Arthritis Unknown     Social History:  reports that she has never smoked. She has never used smokeless tobacco. She reports that she drinks alcohol. She reports that she does not use drugs.  Allergies:  Allergies  Allergen Reactions  . Hydrocodone     nausea    Medications: Prior to Admission medications   Medication Sig Start  Date End Date Taking? Authorizing Provider  amphetamine-dextroamphetamine (ADDERALL XR) 30 MG 24 hr capsule Take 1 capsule (30 mg total) by mouth every morning. 10/23/16  Yes Fayrene Helper, MD  calcium-vitamin D 250-100 MG-UNIT tablet Take 1 tablet by mouth 2 (two) times daily.   Yes [provider]  cetirizine (ZYRTEC HIVES RELIEF) 10 MG tablet Take 10 mg by mouth daily.     Yes [provider]  estradiol (CLIMARA - DOSED IN MG/24 HR) 0.05 mg/24hr patch Place 1 patch (0.05 mg total) onto the skin once a week. 06/13/16  Yes Cresenzo-Dishmon, Joaquim Lai, CNM  medroxyPROGESTERone (PROVERA) 10 MG tablet Take 1 tablet (10 mg total) by mouth daily. 06/13/16  Yes Cresenzo-Dishmon, Joaquim Lai, CNM  Multiple Vitamins-Minerals (MULTIVITAMIN WITH MINERALS) tablet Take 1 tablet by mouth daily.   Yes [provider]  venlafaxine XR (EFFEXOR-XR) 150 MG 24 hr capsule Take 1 capsule (150 mg total) by mouth daily with breakfast. 09/11/16  Yes Fayrene Helper, MD    Scheduled Meds: .  stroke: mapping our early stages of recovery book   Does not apply Once  . amphetamine-dextroamphetamine  30 mg Oral [id number]  . aspirin  300 mg Rectal Daily   Or  . aspirin  325 mg Oral Daily  . enoxaparin (LOVENOX) injection  40 mg Subcutaneous Q24H  . venlafaxine XR  150 mg Oral Q breakfast   Continuous Infusions: . sodium chloride     PRN Meds:.acetaminophen **OR** acetaminophen (TYLENOL) oral liquid 160 mg/5 mL **OR** acetaminophen, senna-docusate     Results for orders placed or performed during the hospital encounter of 10/30/16 (from the past 48 hour(s))  Ethanol     Status: None   Collection Time: 10/30/16  7:40 PM  Result Value Ref Range   Alcohol, Ethyl (B) <5 <5 mg/dL    Comment:        LOWEST DETECTABLE LIMIT FOR SERUM ALCOHOL IS 5 mg/dL FOR MEDICAL PURPOSES ONLY   Protime-INR     Status: None   Collection Time: 10/30/16  7:40 PM  Result Value Ref Range   Prothrombin Time 12.1  11.4 - 15.2 seconds   INR 0.89   APTT     Status: None   Collection Time: 10/30/16  7:40 PM  Result Value Ref Range   aPTT 28 24 - 36 seconds  CBC     Status: None   Collection Time: 10/30/16  7:40 PM  Result Value Ref Range   WBC 9.0 4.0 - 10.5 K/uL   RBC 4.46 3.87 - 5.11 MIL/uL   Hemoglobin 14.0 12.0 - 15.0 g/dL   HCT 40.7 36.0 - 46.0 %   MCV 91.3 78.0 - 100.0 fL   MCH 31.4 26.0 - 34.0 pg   MCHC 34.4 30.0 - 36.0 g/dL   RDW 12.7 11.5 - 15.5 %   Platelets 275 150 - 400 K/uL  Differential     Status: None   Collection Time: 10/30/16  7:40 PM  Result Value Ref Range   Neutrophils Relative % 57 %  Neutro Abs 5.1 1.7 - 7.7 K/uL   Lymphocytes Relative 34 %   Lymphs Abs 3.1 0.7 - 4.0 K/uL   Monocytes Relative 7 %   Monocytes Absolute 0.6 0.1 - 1.0 K/uL   Eosinophils Relative 2 %   Eosinophils Absolute 0.2 0.0 - 0.7 K/uL   Basophils Relative 0 %   Basophils Absolute 0.0 0.0 - 0.1 K/uL  Comprehensive metabolic panel     Status: Abnormal   Collection Time: 10/30/16  7:40 PM  Result Value Ref Range   Sodium 134 (L) 135 - 145 mmol/L   Potassium 3.8 3.5 - 5.1 mmol/L   Chloride 97 (L) 101 - 111 mmol/L   CO2 30 22 - 32 mmol/L   Glucose, Bld 135 (H) 65 - 99 mg/dL   BUN 12 6 - 20 mg/dL   Creatinine, Ser 0.80 0.44 - 1.00 mg/dL   Calcium 9.1 8.9 - 10.3 mg/dL   Total Protein 7.6 6.5 - 8.1 g/dL   Albumin 4.3 3.5 - 5.0 g/dL   AST 24 15 - 41 U/L   ALT 26 14 - 54 U/L   Alkaline Phosphatase 80 38 - 126 U/L   Total Bilirubin 0.2 (L) 0.3 - 1.2 mg/dL   GFR calc non Af Amer >60 >60 mL/min   GFR calc Af Amer >60 >60 mL/min    Comment: (NOTE) The eGFR has been calculated using the CKD EPI equation. This calculation has not been validated in all clinical situations. eGFR's persistently <60 mL/min signify possible Chronic Kidney Disease.    Anion gap 7 5 - 15  Hemoglobin A1c     Status: Abnormal   Collection Time: 10/30/16  7:40 PM  Result Value Ref Range   Hgb A1c MFr Bld 6.0 (H) 4.8 -  5.6 %    Comment: (NOTE) Pre diabetes:          5.7%-6.4% Diabetes:              >6.4% Glycemic control for   <7.0% adults with diabetes    Mean Plasma Glucose 125.5 mg/dL    Comment: Performed at [facility] [address], [place], [place] [address]  I-stat troponin, ED (not at [facility], [facility])     Status: None   Collection Time: 10/30/16  8:01 PM  Result Value Ref Range   Troponin i, poc 0.00 0.00 - 0.08 ng/mL   Comment 3            Comment: Due to the release kinetics of cTnI, a negative result within the first hours of the onset of symptoms does not rule out myocardial infarction with certainty. If myocardial infarction is still suspected, repeat the test at appropriate intervals.   I-Stat Chem 8, ED  (not at [facility], [facility])     Status: Abnormal   Collection Time: 10/30/16  8:03 PM  Result Value Ref Range   Sodium 139 135 - 145 mmol/L   Potassium 3.8 3.5 - 5.1 mmol/L   Chloride 97 (L) 101 - 111 mmol/L   BUN 11 6 - 20 mg/dL   Creatinine, Ser 0.80 0.44 - 1.00 mg/dL   Glucose, Bld 131 (H) 65 - 99 mg/dL   Calcium, Ion 1.15 1.15 - 1.40 mmol/L   TCO2 31 0 - 100 mmol/L   Hemoglobin 13.6 12.0 - 15.0 g/dL   HCT 40.0 36.0 - 46.0 %  Urine rapid drug screen (hosp performed)not at [facility]     Status: Abnormal   Collection Time:  10/30/16  9:15 PM  Result Value Ref Range   Opiates NONE DETECTED NONE DETECTED   Cocaine NONE DETECTED NONE DETECTED   Benzodiazepines NONE DETECTED NONE DETECTED   Amphetamines POSITIVE (A) NONE DETECTED   Tetrahydrocannabinol NONE DETECTED NONE DETECTED   Barbiturates NONE DETECTED NONE DETECTED    Comment:        DRUG SCREEN FOR MEDICAL PURPOSES ONLY.  IF CONFIRMATION IS NEEDED FOR ANY PURPOSE, NOTIFY LAB WITHIN 5 DAYS.        LOWEST DETECTABLE LIMITS FOR URINE DRUG SCREEN Drug Class       Cutoff (ng/mL) Amphetamine      1000 Barbiturate      200 Benzodiazepine   151 Tricyclics       761 Opiates          300 Cocaine          300 THC               50   Urinalysis, Routine w reflex microscopic     Status: Abnormal   Collection Time: 10/30/16  9:15 PM  Result Value Ref Range   Color, Urine STRAW (A) YELLOW   APPearance CLEAR CLEAR   Specific Gravity, Urine 1.016 1.005 - 1.030   pH 7.0 5.0 - 8.0   Glucose, UA NEGATIVE NEGATIVE mg/dL   Hgb urine dipstick NEGATIVE NEGATIVE   Bilirubin Urine NEGATIVE NEGATIVE   Ketones, ur NEGATIVE NEGATIVE mg/dL   Protein, ur NEGATIVE NEGATIVE mg/dL   Nitrite NEGATIVE NEGATIVE   Leukocytes, UA NEGATIVE NEGATIVE  Lipid panel     Status: Abnormal   Collection Time: 10/31/16  6:02 AM  Result Value Ref Range   Cholesterol 171 0 - 200 mg/dL   Triglycerides 89 <150 mg/dL   HDL 45 >40 mg/dL   Total CHOL/HDL Ratio 3.8 RATIO   VLDL 18 0 - 40 mg/dL   LDL Cholesterol 108 (H) 0 - 99 mg/dL    Comment:        Total Cholesterol/HDL:CHD Risk Coronary Heart Disease Risk Table                     Men   Women  1/2 Average Risk   3.4   3.3  Average Risk       5.0   4.4  2 X Average Risk   9.6   7.1  3 X Average Risk  23.4   11.0        Use the calculated Patient Ratio above and the CHD Risk Table to determine the patient's CHD Risk.        ATP III CLASSIFICATION (LDL):  <100     mg/dL   Optimal  100-129  mg/dL   Near or Above                    Optimal  130-159  mg/dL   Borderline  160-189  mg/dL   High  >190     mg/dL   Very High     Studies/Results:  TTE - Left ventricle: The cavity size was normal. Wall thickness was   normal. Systolic function was normal. The estimated ejection   fraction was in the range of 60% to 65%. Wall motion was normal;   there were no regional wall motion abnormalities. There was a   borderline abnormality in the ratio of early to atrial left   ventricular filling.       HEAD CTA  FINDINGS: ANTERIOR CIRCULATION: Patent cervical internal carotid arteries, petrous, cavernous and supra clinoid internal carotid arteries. Widely patent anterior communicating  artery. Patent anterior and middle cerebral arteries. Minimal luminal irregularity LEFT M1 segment.  No large vessel occlusion, significant stenosis, contrast extravasation or aneurysm.  POSTERIOR CIRCULATION: RIGHT vertebral artery is dominant. Patent vertebral arteries, vertebrobasilar junction and basilar artery, as well as main branch vessels. Minimal luminal irregularity vertebral arteries and basilar artery. Patent posterior cerebral arteries. Small RIGHT posterior communicating artery present.  No large vessel occlusion, significant stenosis, contrast extravasation or aneurysm.  VENOUS SINUSES: Major dural venous sinuses are patent though not tailored for evaluation on this angiographic examination.  ANATOMIC VARIANTS: None.  DELAYED PHASE: Not performed.  MIP images reviewed.  IMPRESSION: 1. No emergent large vessel occlusion or severe stenosis. 2. Minimal luminal irregularity secondary to atherosclerosis or artifact.     BRAIN MRI FINDINGS: BRAIN: No reduced diffusion to suggest acute ischemia or hyperacute demyelination. No susceptibility artifact to suggest hemorrhage. The ventricles and sulci are normal for patient's age. A few scattered subcentimeter supratentorial white matter FLAIR T2 hyperintensities. No suspicious parenchymal signal, mass or mass effect. No abnormal extra-axial fluid collections.  VASCULAR: Normal major intracranial vascular flow voids present at skull base.  SKULL AND UPPER CERVICAL SPINE: No abnormal sellar expansion. No suspicious calvarial bone marrow signal. Craniocervical junction maintained.  SINUSES/ORBITS: Trace paranasal sinus mucosal thickening and mastoid effusions. The included ocular globes and orbital contents are non-suspicious.  OTHER: None.  IMPRESSION: 1. No acute intracranial process. 2. Minimal white matter changes seen with chronic small vessel ischemic disease and migraine ; atypical  distribution for demyelination.     The brain MRI is reviewed in person. No acute increased signal is seen on DWI. No hemorrhages appreciated. Flair shows 5 deep white matter increased signal mostly tiny although there are 2 moderate sized linear subcortical lesions. No lesions which abuts the ventricles or the corpus callosum. No lesions seen on T1.     CAROTID DOPPLER: No significant stenosis with normal velocities.    Jamillah Camilo A. Merlene Laughter, M.D.  Diplomate, Tax adviser of Psychiatry and Neurology ( Neurology). 10/31/2016, 2:57 PM

## 2016-10-31 NOTE — Discharge Summary (Signed)
Physician Discharge Summary  Carmen FLEECE OXB:[id number] DOB: [birth year] DOA: 10/30/2016  PCP: Fayrene Helper, MD  Admit date: 10/30/2016 Discharge date: 10/31/2016  Admitted From: home Disposition:  home  Recommendations for Outpatient Follow-up:  1. Follow up with PCP in 1-2 weeks 2. Please obtain BMP/CBC in one week 3. Follow up with neurology in 2 months 4. Repeat MRI brain with and without contrast in 12 months to follow white matter lesions  Home Health: Equipment/Devices:  Discharge Condition: stable CODE STATUS:full code Diet recommendation: Heart Healthy / Carb Modified  Brief/Interim Summary: 51 year old female with a history of prediabetes, hypertension, hyperlipidemia, presented to the emergency room with complaints of diplopia, slurred speech and left-sided weakness. She was admitted to the hospital for further workup of CVA. MRI of the brain did not show any acute infarct. Imaging of vasculature did not show any high-grade stenosis. Echocardiogram was also unremarkable. Seen by neurology who felt that she may have a small brainstem infarct that did not show up on MRI imaging. Recommendations were to treat medically with aspirin for secondary prevention. LDL was noted to be elevated at 101 and she was started on low-dose statin. Blood pressures also been elevated, started on low-dose lisinopril. A1c was 6, she was encouraged to discuss this further with her primary care physician regarding lifestyle angina versus metformin therapy. Patient does not have any deficits at this time. MRI of the brain did show some white matter lesions which will need to be followed up in 12 months with a repeat MRI. She'll follow up with neurology in 2 months. Follow-up with primary care physician a 1-2 weeks. She's otherwise been clear for discharge.  Discharge Diagnoses:  Active Problems:   Hyperlipidemia LDL goal <100   Prediabetes   Stroke (cerebrum) (HCC)   HTN  (hypertension)    Discharge Instructions  Discharge Instructions    Diet - low sodium heart healthy    Complete by:  As directed    Increase activity slowly    Complete by:  As directed      Allergies as of 10/31/2016      Reactions   Hydrocodone    nausea      Medication List    STOP taking these medications   estradiol 0.05 mg/24hr patch Commonly known as:  CLIMARA - Dosed in mg/24 hr   medroxyPROGESTERone 10 MG tablet Commonly known as:  PROVERA     TAKE these medications   amphetamine-dextroamphetamine 30 MG 24 hr capsule Commonly known as:  ADDERALL XR Take 1 capsule (30 mg total) by mouth every morning.   aspirin 81 MG tablet Take 1 tablet (81 mg total) by mouth daily.   atorvastatin 20 MG tablet Commonly known as:  LIPITOR Take 1 tablet (20 mg total) by mouth daily.   calcium-vitamin D 250-100 MG-UNIT tablet Take 1 tablet by mouth 2 (two) times daily.   lisinopril 10 MG tablet Commonly known as:  PRINIVIL,ZESTRIL Take 1 tablet (10 mg total) by mouth daily.   multivitamin with minerals tablet Take 1 tablet by mouth daily.   venlafaxine XR 150 MG 24 hr capsule Commonly known as:  EFFEXOR-XR Take 1 capsule (150 mg total) by mouth daily with breakfast.   ZYRTEC HIVES RELIEF 10 MG tablet Generic drug:  cetirizine Take 10 mg by mouth daily.            Discharge Care Instructions        Start     Ordered   10/31/16  0000  aspirin 81 MG tablet  Daily     10/31/16 1639   10/31/16 0000  atorvastatin (LIPITOR) 20 MG tablet  Daily     10/31/16 1639   10/31/16 0000  lisinopril (PRINIVIL,ZESTRIL) 10 MG tablet  Daily     10/31/16 1639   10/31/16 0000  Increase activity slowly     10/31/16 1639   10/31/16 0000  Diet - low sodium heart healthy     10/31/16 1639      Allergies  Allergen Reactions  . Hydrocodone     nausea    Consultations:  neurology   Procedures/Studies: Ct Angio Head W/cm &/or Wo Cm  Result Date: 10/30/2016 CLINICAL  DATA:  Resolved LEFT-sided weakness and diplopia. EXAM: CT ANGIOGRAPHY HEAD TECHNIQUE: Multidetector CT imaging of the head was performed using the standard protocol during bolus administration of intravenous contrast. Multiplanar CT image reconstructions and MIPs were obtained to evaluate the vascular anatomy. CONTRAST:  75 cc Isovue 370 COMPARISON:  CT HEAD October 30, 2016 at 1938 hours and MRI of the head October 30, 2016 at 2037 hours FINDINGS: ANTERIOR CIRCULATION: Patent cervical internal carotid arteries, petrous, cavernous and supra clinoid internal carotid arteries. Widely patent anterior communicating artery. Patent anterior and middle cerebral arteries. Minimal luminal irregularity LEFT M1 segment. No large vessel occlusion, significant stenosis, contrast extravasation or aneurysm. POSTERIOR CIRCULATION: RIGHT vertebral artery is dominant. Patent vertebral arteries, vertebrobasilar junction and basilar artery, as well as main branch vessels. Minimal luminal irregularity vertebral arteries and basilar artery. Patent posterior cerebral arteries. Small RIGHT posterior communicating artery present. No large vessel occlusion, significant stenosis, contrast extravasation or aneurysm. VENOUS SINUSES: Major dural venous sinuses are patent though not tailored for evaluation on this angiographic examination. ANATOMIC VARIANTS: None. DELAYED PHASE: Not performed. MIP images reviewed. IMPRESSION: 1. No emergent large vessel occlusion or severe stenosis. 2. Minimal luminal irregularity secondary to atherosclerosis or artifact. Electronically Signed   By: Elon Alas M.D.   On: 10/30/2016 21:22   Dg Chest 2 View  Result Date: 10/31/2016 CLINICAL DATA:  Weakness, right lower lobectomy EXAM: CHEST  2 VIEW COMPARISON:  10/06/2012 FINDINGS: There is evidence of prior right lower lobectomy. There is no focal parenchymal opacity. There is no pleural effusion or pneumothorax. The heart and mediastinal contours are  unremarkable. The osseous structures are unremarkable. IMPRESSION: No active cardiopulmonary disease. Electronically Signed   By: Kathreen Devoid   On: 10/31/2016 09:40   Mr Brain Wo Contrast (neuro Protocol)  Result Date: 10/30/2016 CLINICAL DATA:  Dizziness, diplopia, improving. EXAM: MRI HEAD WITHOUT CONTRAST TECHNIQUE: Multiplanar, multiecho pulse sequences of the brain and surrounding structures were obtained without intravenous contrast. COMPARISON:  CT HEAD October 30, 2016 at 1938 hours FINDINGS: BRAIN: No reduced diffusion to suggest acute ischemia or hyperacute demyelination. No susceptibility artifact to suggest hemorrhage. The ventricles and sulci are normal for patient's age. A few scattered subcentimeter supratentorial white matter FLAIR T2 hyperintensities. No suspicious parenchymal signal, mass or mass effect. No abnormal extra-axial fluid collections. VASCULAR: Normal major intracranial vascular flow voids present at skull base. SKULL AND UPPER CERVICAL SPINE: No abnormal sellar expansion. No suspicious calvarial bone marrow signal. Craniocervical junction maintained. SINUSES/ORBITS: Trace paranasal sinus mucosal thickening and mastoid effusions. The included ocular globes and orbital contents are non-suspicious. OTHER: None. IMPRESSION: 1. No acute intracranial process. 2. Minimal white matter changes seen with chronic small vessel ischemic disease and migraine ; atypical distribution for demyelination. Electronically Signed   By: Sandie Ano  Bloomer M.D.   On: 10/30/2016 20:57   US Carotid Bilateral (at [facility] Only)  Result Date: 10/31/2016 CLINICAL DATA:  Diplopia. EXAM: BILATERAL CAROTID DUPLEX ULTRASOUND TECHNIQUE: Pearline Cables scale imaging, color Doppler and duplex ultrasound were performed of bilateral carotid and vertebral arteries in the neck. COMPARISON:  CT 10/30/2016 . FINDINGS: Criteria: Quantification of carotid stenosis is based on velocity parameters that correlate the residual  internal carotid diameter with NASCET-based stenosis levels, using the diameter of the distal internal carotid lumen as the denominator for stenosis measurement. The following velocity measurements were obtained: RIGHT ICA:  88/36 cm/sec CCA:  40/08 cm/sec SYSTOLIC ICA/CCA RATIO:  1.0 DIASTOLIC ICA/CCA RATIO:  1.9 ECA:  119 cm/sec LEFT ICA:  94/34 cm/sec CCA:  67/61 cm/sec SYSTOLIC ICA/CCA RATIO:  1.1 DIASTOLIC ICA/CCA RATIO:  1.3 ECA:  117 cm/sec RIGHT CAROTID ARTERY: Diffuse mild right carotid artery disease. No flow limiting stenosis. RIGHT VERTEBRAL ARTERY:  Patent with antegrade flow. LEFT CAROTID ARTERY: Diffuse mild left carotid atherosclerotic vascular disease. No flow limiting stenosis. LEFT VERTEBRAL ARTERY:  Patent with antegrade flow. IMPRESSION: 1. 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 No flow limiting stenosis. 2. Vertebrals are patent with antegrade flow. Electronically Signed   By: Marcello Moores  Register   On: 10/31/2016 10:31   Ct Head Code Stroke Wo Contrast  Result Date: 10/30/2016 CLINICAL DATA:  Code stroke. Blurry vision and diplopia for 45 minutes. EXAM: CT HEAD WITHOUT CONTRAST TECHNIQUE: Contiguous axial images were obtained from the base of the skull through the vertex without intravenous contrast. COMPARISON:  None. FINDINGS: BRAIN: No intraparenchymal hemorrhage, mass effect nor midline shift. The ventricles and sulci are normal. No acute large vascular territory infarcts. No abnormal extra-axial fluid collections. Basal cisterns are patent. VASCULAR: Unremarkable. SKULL/SOFT TISSUES: No skull fracture. No significant soft tissue swelling. ORBITS/SINUSES: The included ocular globes and orbital contents are normal.The mastoid aircells and included paranasal sinuses are well-aerated. OTHER: None. ASPECTS [facility] Stroke Program Early CT Score) - Ganglionic level infarction (caudate, lentiform nuclei, internal capsule, insula, M1-M3 cortex): 7 - Supraganglionic  infarction (M4-M6 cortex): 3 Total score (0-10 with 10 being normal): 10 IMPRESSION: 1. Negative noncontrast CT HEAD. 2. ASPECTS is 10. Critical Value/emergent results were called by telephone at the time of interpretation on 10/30/2016 at 7:55pm to Dr. Fredia Sorrow , who verbally acknowledged these results. Electronically Signed   By: Elon Alas M.D.   On: 10/30/2016 19:58   Echo: Left ventricle: The cavity size was normal. Wall thickness was   normal. Systolic function was normal. The estimated ejection   fraction was in the range of 60% to 65%. Wall motion was normal;   there were no regional wall motion abnormalities. There was a   borderline abnormality in the ratio of early to atrial left   ventricular filling.   Subjective: Diplopia, slurred speech and left sided weakness have resolved. No new complaints  Discharge Exam: Vitals:   10/31/16 1315 10/31/16 1515  BP: 137/84 (!) 142/79  Pulse: 93 91  Resp: 19 20  Temp: 97.6 F (36.4 C) 98 F (36.7 C)  SpO2: 100% 96%   Vitals:   10/31/16 0915 10/31/16 1115 10/31/16 1315 10/31/16 1515  BP: 133/73 126/72 137/84 (!) 142/79  Pulse: 80 70 93 91  Resp: 19 20 19 20   Temp: 98.3 F (36.8 C) 98.4 F (36.9 C) 97.6 F (36.4 C) 98 F (36.7 C)  TempSrc: Oral Oral Oral Oral  SpO2: 96% 96% 100% 96%  Weight:      Height:        General: Pt is alert, awake, not in acute distress Cardiovascular: RRR, S1/S2 +, no rubs, no gallops Respiratory: CTA bilaterally, no wheezing, no rhonchi Abdominal: Soft, NT, ND, bowel sounds + Extremities: no edema, no cyanosis    The results of significant diagnostics from this hospitalization (including imaging, microbiology, ancillary and laboratory) are listed below for reference.     Microbiology: No results found for this or any previous visit (from the past 240 hour(s)).   Labs: BNP (last 3 results) No results for input(s): BNP in the last 8760 hours. Basic Metabolic Panel:  Recent  Labs Lab 10/30/16 1940 10/30/16 2003  NA 134* 139  K 3.8 3.8  CL 97* 97*  CO2 30  --   GLUCOSE 135* 131*  BUN 12 11  CREATININE 0.80 0.80  CALCIUM 9.1  --    Liver Function Tests:  Recent Labs Lab 10/30/16 1940  AST 24  ALT 26  ALKPHOS 80  BILITOT 0.2*  PROT 7.6  ALBUMIN 4.3   No results for input(s): LIPASE, AMYLASE in the last 168 hours. No results for input(s): AMMONIA in the last 168 hours. CBC:  Recent Labs Lab 10/30/16 1940 10/30/16 2003  WBC 9.0  --   NEUTROABS 5.1  --   HGB 14.0 13.6  HCT 40.7 40.0  MCV 91.3  --   PLT 275  --    Cardiac Enzymes: No results for input(s): CKTOTAL, CKMB, CKMBINDEX, TROPONINI in the last 168 hours. BNP: Invalid input(s): POCBNP CBG: No results for input(s): GLUCAP in the last 168 hours. D-Dimer No results for input(s): DDIMER in the last 72 hours. Hgb A1c  Recent Labs  10/30/16 1940  HGBA1C 6.0*   Lipid Profile  Recent Labs  10/31/16 0602  CHOL 171  HDL 45  LDLCALC 108*  TRIG 89  CHOLHDL 3.8   Thyroid function studies No results for input(s): TSH, T4TOTAL, T3FREE, THYROIDAB in the last 72 hours.  Invalid input(s): FREET3 Anemia work up No results for input(s): VITAMINB12, FOLATE, FERRITIN, TIBC, IRON, RETICCTPCT in the last 72 hours. Urinalysis    Component Value Date/Time   COLORURINE STRAW (A) 10/30/2016 2115   APPEARANCEUR CLEAR 10/30/2016 2115   LABSPEC 1.016 10/30/2016 2115   PHURINE 7.0 10/30/2016 2115   GLUCOSEU NEGATIVE 10/30/2016 2115   HGBUR NEGATIVE 10/30/2016 2115   HGBUR small 01/30/2009 1109   BILIRUBINUR NEGATIVE 10/30/2016 2115   KETONESUR NEGATIVE 10/30/2016 2115   PROTEINUR NEGATIVE 10/30/2016 2115   UROBILINOGEN 0.2 01/30/2009 1109   NITRITE NEGATIVE 10/30/2016 2115   LEUKOCYTESUR NEGATIVE 10/30/2016 2115   Sepsis Labs Invalid input(s): PROCALCITONIN,  WBC,  LACTICIDVEN Microbiology No results found for this or any previous visit (from the past 240 hour(s)).   Time  coordinating discharge: Over 30 minutes  SIGNED:   Kathie Dike, MD  [facility] 10/31/2016, 4:42 PM Pager   If 7PM-7AM, please contact night-coverage [email or web address] Password [email or web address]

## 2016-10-31 NOTE — Care Management Note (Signed)
Case Management Note  Patient Details  Name: KERRYN TENNANT MRN: [id number] Date of Birth: 1965/11/29   If discussed at Long Length of Stay Meetings, dates discussed:   Patient adm for stroke workup. Reports being back to baseline. Husband at bedside. Patient plans to discharge home with husband. No CM needs. Patient hs been ambulating without problems.  Additional Comments:  Harlan Ervine, Chauncey Reading, RN 10/31/2016, 1:51 PM

## 2016-11-03 ENCOUNTER — Other Ambulatory Visit: Payer: Self-pay | Admitting: Family Medicine

## 2016-11-03 ENCOUNTER — Telehealth: Payer: Self-pay

## 2016-11-03 DIAGNOSIS — E669 Obesity, unspecified: Secondary | ICD-10-CM

## 2016-11-03 DIAGNOSIS — I6389 Other cerebral infarction: Secondary | ICD-10-CM

## 2016-11-03 DIAGNOSIS — E785 Hyperlipidemia, unspecified: Secondary | ICD-10-CM

## 2016-11-03 NOTE — Telephone Encounter (Signed)
Transition Care Management Follow-up Telephone Call   Date discharged? 11/02/2016               How have you been since you were released from the hospital? Feeling much better, symptoms have gone away.   Do you understand why you were in the hospital? Yes, for slurred speech and left sided weakness and for evaluation of acute infart.    Do you understand the discharge instructions? yes   Where were you discharged to? home   Items Reviewed:  Medications reviewed: yes, she started the lisinopril and asa and atorvastatin with no adverse effects  Allergies reviewed: yes  Dietary changes reviewed: yes, heart health and carb modified   Referrals reviewed: yes, referred to nutritionist   Functional Questionnaire:   Activities of Daily Living (ADLs):  no difficulty    Any transportation issues/concerns?: no transport issues   Any patient concerns?  no concerns   Confirmed importance and date/time of follow-up visits scheduled Scheduled for 11/11/16 at 2:00     Confirmed with patient if condition begins to worsen call PCP or go to the ER.  Patient was given the office number and encouraged to call back with question or concerns.  : yes

## 2016-11-06 NOTE — Progress Notes (Signed)
CODE STROKE 10/30/2016 0737 CALL [phone] BEEPER 0742 EXAM STARTED 0745 EXAM FINISHED 0745 EXAM COMPLETE [place]

## 2016-11-11 ENCOUNTER — Encounter: Payer: Self-pay | Admitting: Family Medicine

## 2016-11-11 ENCOUNTER — Ambulatory Visit (INDEPENDENT_AMBULATORY_CARE_PROVIDER_SITE_OTHER): Payer: 59 | Admitting: Family Medicine

## 2016-11-11 VITALS — BP 104/72 | HR 110 | Temp 97.8°F | Ht 61.0 in | Wt 172.0 lb

## 2016-11-11 DIAGNOSIS — E669 Obesity, unspecified: Secondary | ICD-10-CM

## 2016-11-11 DIAGNOSIS — E785 Hyperlipidemia, unspecified: Secondary | ICD-10-CM

## 2016-11-11 DIAGNOSIS — R7303 Prediabetes: Secondary | ICD-10-CM | POA: Diagnosis not present

## 2016-11-11 DIAGNOSIS — I10 Essential (primary) hypertension: Secondary | ICD-10-CM | POA: Diagnosis not present

## 2016-11-11 DIAGNOSIS — Z09 Encounter for follow-up examination after completed treatment for conditions other than malignant neoplasm: Secondary | ICD-10-CM

## 2016-11-11 NOTE — Patient Instructions (Addendum)
Cancel and reschedule f/u for first week in December  Fasting lipid, cmp and eGFR and hBA1C Nov 30 or after  Please call for appt with Dr Merlene Laughter if you do not hear form his office in next 2 weeks  I will message nutritionisrt to get in touch with you  Keep good health habits going they only make you feel better and will make you healthier  Thank you  for choosing [facility]. We consider it a privelige to serve you.  Delivering excellent health care in a caring and  compassionate way is our goal.  Partnering with you,  so that together we can achieve this goal is our strategy.

## 2016-11-18 ENCOUNTER — Telehealth: Payer: Self-pay | Admitting: Nutrition

## 2016-11-18 NOTE — Telephone Encounter (Signed)
VM left to call and schedule appt.

## 2016-11-19 DIAGNOSIS — Z09 Encounter for follow-up examination after completed treatment for conditions other than malignant neoplasm: Secondary | ICD-10-CM | POA: Insufficient documentation

## 2016-11-19 NOTE — Assessment & Plan Note (Signed)
Hospitalized overnight with primary dx of TIA/ CVA, underlying neurologic pathology still being entertained and wioll require rept imaging in 12 months and neurology follow up

## 2016-11-19 NOTE — Assessment & Plan Note (Signed)
Controlled, no change in medication DASH diet and commitment to daily physical activity for a minimum of 30 minutes discussed and encouraged, as a part of hypertension management. The importance of attaining a healthy weight is also discussed.  BP/Weight 11/11/2016 10/31/2016 10/30/2016 10/23/2016 09/11/2016 04/15/2016 16/08/628  Systolic BP 160 109 - 323 557 322 025  Diastolic BP 72 79 - 82 80 82 80  Wt. (Lbs) 172 - 165 177 173 174 173  BMI 32.5 - 31.18 33.44 32.69 32.88 32.69

## 2016-11-19 NOTE — Assessment & Plan Note (Signed)
Hyperlipidemia:Low fat diet discussed and encouraged.   Lipid Panel  Lab Results  Component Value Date   CHOL 171 10/31/2016   HDL 45 10/31/2016   LDLCALC 108 (H) 10/31/2016   TRIG 89 10/31/2016   CHOLHDL 3.8 10/31/2016     Updated lab needed at/ before next visit.

## 2016-11-19 NOTE — Assessment & Plan Note (Signed)
Deteriorated. Patient re-educated about  the importance of commitment to a  minimum of 150 minutes of exercise per week.  The importance of healthy food choices with portion control discussed. Encouraged to start a food diary, count calories and to consider  joining a support group. Sample diet sheets offered. Goals set by the patient for the next several months.   Weight /BMI 11/11/2016 10/30/2016 10/23/2016  WEIGHT 172 lb 165 lb 177 lb  HEIGHT 5\' 1"  5\' 1"  5\' 1"   BMI 32.5 kg/m2 31.18 kg/m2 33.44 kg/m2

## 2016-11-19 NOTE — Assessment & Plan Note (Signed)
Patient educated about the importance of limiting  Carbohydrate intake , the need to commit to daily physical activity for a minimum of 30 minutes , and to commit weight loss. The fact that changes in all these areas will reduce or eliminate all together the development of diabetes is stressed.   Diabetic Labs Latest Ref Rng & Units 10/31/2016 10/30/2016 10/30/2016 08/07/2016 03/07/2016  HbA1c 4.8 - 5.6 % - - 6.0(H) 5.9(H) 5.8(H)  Chol 0 - 200 mg/dL 171 - - 176 200(H)  HDL >40 mg/dL 45 - - 39(L) 45(L)  Calc LDL 0 - 99 mg/dL 108(H) - - 113(H) 114(H)  Triglycerides <150 mg/dL 89 - - 118 206(H)  Creatinine 0.44 - 1.00 mg/dL - 0.80 0.80 0.84 0.76   BP/Weight 11/11/2016 10/31/2016 10/30/2016 10/23/2016 09/11/2016 04/15/2016 87/04/1585  Systolic BP 276 184 - 859 276 394 320  Diastolic BP 72 79 - 82 80 82 80  Wt. (Lbs) 172 - 165 177 173 174 173  BMI 32.5 - 31.18 33.44 32.69 32.88 32.69   No flowsheet data found.  Updated lab needed at/ before next visit.

## 2016-11-19 NOTE — Progress Notes (Signed)
Carmen Mcintyre     MRN: [id number]      DOB: Sep 29, 1965   HPI Carmen Mcintyre is here for follow up of recent hospitalization from 8/23 to 10/31/2016 with a discharge diagnosis of TIA presenting as diplopia and unilateral hemiparesis. Underlying neurologic disease from abnormal brain scan is being considered and ongoing neurology follow up needed. 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 She is extremely energized and re focussed on changing lifestyles to improve health and anxious to get to nutrition educator once more She has started regular exercise and has actually lost weight since her last visit, has also stopped HRT  ROS Denies recent fever or chills. Denies sinus pressure, nasal congestion, ear pain or sore throat. Denies chest congestion, productive cough or wheezing. Denies chest pains, palpitations and leg swelling Denies abdominal pain, nausea, vomiting,diarrhea or constipation.   Denies dysuria, frequency, hesitancy or incontinence. Denies joint pain, swelling and limitation in mobility. Denies headaches, seizures, numbness, or tingling. Denies depression, anxiety or insomnia. Denies skin break down or rash.   PE  BP 104/72 (BP Location: Left Arm, Patient Position: Sitting, Cuff Size: Large)   Pulse (!) 110   Temp 97.8 F (36.6 C)   Ht 5\' 1"  (1.549 m)   Wt 172 lb (78 kg)   SpO2 98%   BMI 32.50 kg/m   Patient alert and oriented and in no cardiopulmonary distress.  HEENT: No facial asymmetry, EOMI,   oropharynx pink and moist.  Neck supple no JVD, no mass.  Chest: Clear to auscultation bilaterally.  CVS: S1, S2 no murmurs, no S3.Regular rate.  ABD: Soft non tender.   Ext: No edema  MS: Adequate ROM spine, shoulders, hips and knees.  Skin: Intact, no ulcerations or rash noted.  Psych: Good eye contact, normal affect. Memory intact  not anxious or depressed appearing.  CNS: CN 2-12 intact, power,  normal throughout.no focal deficits noted.   [facility] discharge follow-up Hospitalized overnight with primary dx of TIA/ CVA, underlying neurologic pathology still being entertained and wioll require rept imaging in 12 months and neurology follow up    HTN (hypertension) Controlled, no change in medication DASH diet and commitment to daily physical activity for a minimum of 30 minutes discussed and encouraged, as a part of hypertension management. The importance of attaining a healthy weight is also discussed.  BP/Weight 11/11/2016 10/31/2016 10/30/2016 10/23/2016 09/11/2016 04/15/2016 00/10/6759  Systolic BP 950 932 - 671 245 809 983  Diastolic BP 72 79 - 82 80 82 80  Wt. (Lbs) 172 - 165 177 173 174 173  BMI 32.5 - 31.18 33.44 32.69 32.88 32.69       Hyperlipidemia LDL goal <100 Hyperlipidemia:Low fat diet discussed and encouraged.   Lipid Panel  Lab Results  Component Value Date   CHOL 171 10/31/2016   HDL 45 10/31/2016   LDLCALC 108 (H) 10/31/2016   TRIG 89 10/31/2016   CHOLHDL 3.8 10/31/2016     Updated lab needed at/ before next visit.   Prediabetes Patient educated about the importance of limiting  Carbohydrate intake , the need to commit to daily physical activity for a minimum of 30 minutes , and to commit weight loss. The fact that changes in all these areas will reduce or eliminate all together the development of diabetes is stressed.   Diabetic Labs Latest Ref Rng &  Units 10/31/2016 10/30/2016 10/30/2016 08/07/2016 03/07/2016  HbA1c 4.8 - 5.6 % - - 6.0(H) 5.9(H) 5.8(H)  Chol 0 - 200 mg/dL 171 - - 176 200(H)  HDL >40 mg/dL 45 - - 39(L) 45(L)  Calc LDL 0 - 99 mg/dL 108(H) - - 113(H) 114(H)  Triglycerides <150 mg/dL 89 - - 118 206(H)  Creatinine 0.44 - 1.00 mg/dL - 0.80 0.80 0.84 0.76   BP/Weight 11/11/2016 10/31/2016 10/30/2016 10/23/2016 09/11/2016 04/15/2016 21/03/1733  Systolic BP 670 141  - 030 131 438 887  Diastolic BP 72 79 - 82 80 82 80  Wt. (Lbs) 172 - 165 177 173 174 173  BMI 32.5 - 31.18 33.44 32.69 32.88 32.69   No flowsheet data found.  Updated lab needed at/ before next visit.   Obesity (BMI 30.0-34.9) Deteriorated. Patient re-educated about  the importance of commitment to a  minimum of 150 minutes of exercise per week.  The importance of healthy food choices with portion control discussed. Encouraged to start a food diary, count calories and to consider  joining a support group. Sample diet sheets offered. Goals set by the patient for the next several months.   Weight /BMI 11/11/2016 10/30/2016 10/23/2016  WEIGHT 172 lb 165 lb 177 lb  HEIGHT 5\' 1"  5\' 1"  5\' 1"   BMI 32.5 kg/m2 31.18 kg/m2 33.44 kg/m2

## 2016-11-20 ENCOUNTER — Encounter: Payer: 59 | Attending: Family Medicine | Admitting: Nutrition

## 2016-11-20 VITALS — Ht 61.0 in | Wt 175.0 lb

## 2016-11-20 DIAGNOSIS — E669 Obesity, unspecified: Secondary | ICD-10-CM

## 2016-11-20 DIAGNOSIS — R739 Hyperglycemia, unspecified: Secondary | ICD-10-CM

## 2016-11-20 DIAGNOSIS — E785 Hyperlipidemia, unspecified: Secondary | ICD-10-CM

## 2016-11-20 NOTE — Patient Instructions (Signed)
Goals 1. Follow My Plate 2. Increase exercise 30 minutes to 3 times per week 3. Increase fresh fruits and vegetables. 4. Eat breakfast daily. Lose 2-3 lbs per month

## 2016-11-20 NOTE — Progress Notes (Signed)
  Medical Nutrition Therapy:  Appt start time: 0830 end time:  0930.   Assessment:  Primary concerns today:  Overweight Mini strokes, prediabetes, dyslipidemia. Was in hospital recently for possible TIA's. BP had been elevated.  Wants to lose weight, eat healthier and prevent diabetes.    Lives with her husband and son. She does most of the cooking and shopping. Eats out a few times per week. Skips breakfast often. Usually eats 2 meals per day and snacks 1-2 times per day. Just started back walking.    LDL 108 mg/dl. A1C 6%. Checking BP at home; usually 120-130's/78-80 mg/dl.. Engaged to make lifestyle changes with diet and exercise to improve BP, wt and blood sugars.  Preferred Learning Style:  No preference indicated   Learning Readiness:   Ready  Change in progress   MEDICATIONS:   DIETARY INTAKE:   24-hr recall:  B ( AM): Oatmeal, cooked or yogurt Activiai, apples, coffee- 1 tbsp flavored creamer;  OR 1-2 a week eggs, bacon and toast or SKIPS Snk ( AM):   L ( PM): [place] salad with grilled chicken,  Unsweet tea Snk ( PM):  D ( PM):  Pork ribs, bbq sauce, Diet Lemonade Snk ( PM): Benea-2 with powered sugar. FF milk, 4 oz Beverages: unsweet tea, diet lemonade and 2 bottles of water  Usual physical activity: Walk twice a day 30 minutes twice a day  Estimated energy needs: 1200  calories 135 g carbohydrates 90 g protein 33 g fat  Progress Towards Goal(s):  In progress.   Nutritional Diagnosis:  NB-1.1 Food and nutrition-related knowledge deficit As related to Overweight, prediabetes and dyslipidemia.  As evidenced by LDL 108, A1C 6% and BMI > 30..    Intervention:  Nutrition and Diabetes education provided on My Plate, CHO counting, meal planning, portion sizes, reading food labels, timing of meals, avoiding snacks between meals  taking medications as prescribed, benefits of exercising 30 minutes per day/150 minutes per week and prevention diabetes type 2. High Fiber  Low Salt Diet.  Teaching Method Utilized:  Visual Auditory Hands on  Handouts given during visit include:  The Plate Method   Meal Plan Card    Barriers to learning/adherence to lifestyle change: none  Demonstrated degree of understanding via:  Teach Back   Monitoring/Evaluation:  Dietary intake, exercise, meal planning, and body weight in 2 month(s).

## 2016-12-03 MED FILL — ADDERALL XR 30 MG CAP SA: 30 | 30 days supply | Qty: 30 | Fill #0

## 2016-12-03 MED FILL — LISINOPRIL 10 MG TABS: 10 | 30 days supply | Qty: 30 | Fill #1

## 2016-12-03 MED FILL — ASPIRIN ADULT LOW STRENGTH: 81 | 30 days supply | Qty: 30 | Fill #1

## 2016-12-03 MED FILL — ATORVASTATIN 20 MG TABLET: 20 | 30 days supply | Qty: 30 | Fill #1

## 2016-12-23 DIAGNOSIS — H532 Diplopia: Secondary | ICD-10-CM | POA: Diagnosis not present

## 2016-12-23 DIAGNOSIS — R270 Ataxia, unspecified: Secondary | ICD-10-CM | POA: Diagnosis not present

## 2016-12-23 DIAGNOSIS — I10 Essential (primary) hypertension: Secondary | ICD-10-CM | POA: Diagnosis not present

## 2016-12-23 DIAGNOSIS — G463 Brain stem stroke syndrome: Secondary | ICD-10-CM | POA: Diagnosis not present

## 2016-12-24 MED FILL — VENLAFAXINE HCL ER 150 MG C: 150 | 90 days supply | Qty: 90 | Fill #1

## 2016-12-31 MED FILL — ADDERALL XR 30 MG CAP SA: 30 | 30 days supply | Qty: 30 | Fill #0

## 2017-01-06 MED FILL — ATORVASTATIN 20 MG TABLET: 20 | 30 days supply | Qty: 30 | Fill #2

## 2017-01-15 ENCOUNTER — Ambulatory Visit: Payer: 59 | Admitting: Family Medicine

## 2017-01-20 ENCOUNTER — Encounter: Payer: 59 | Attending: Family Medicine | Admitting: Nutrition

## 2017-01-20 ENCOUNTER — Encounter: Payer: Self-pay | Admitting: Nutrition

## 2017-01-20 VITALS — Wt 169.0 lb

## 2017-01-20 DIAGNOSIS — R739 Hyperglycemia, unspecified: Secondary | ICD-10-CM

## 2017-01-20 DIAGNOSIS — E669 Obesity, unspecified: Secondary | ICD-10-CM

## 2017-01-20 DIAGNOSIS — E785 Hyperlipidemia, unspecified: Secondary | ICD-10-CM

## 2017-01-20 NOTE — Patient Instructions (Addendum)
Goals 1. Increase physical activity to 60 minutes three times per week. 2. Lose  2-3 lbs per month Increase water intake. Get a FIB BIT.

## 2017-01-20 NOTE — Progress Notes (Signed)
  Medical Nutrition Therapy:  Appt start time: 8466 end time:  0930.   Assessment:  Primary concerns today:  Overweight Mini strokes, prediabetes, dyslipidemia.   Lost 6 lbs. Has changed amounts and type of foods eaten. Eating more fresh fruits and vegetables. Physical activity:  Walking more. Set up her weight bench in the garage and will start using that..   Feels a lot better.  WIll get labs done in Dec at Dr. Griffin Dakin office.  Preferred Learning Style:  No preference indicated   Learning Readiness:   Ready  Change in progress   MEDICATIONS:   DIETARY INTAKE:   24-hr recall:  B ( AM): Activi yogurt and piece of fruit.   Snk ( AM):   L ( PM): chicken, veggies, apple slices.  Water. Snk ( PM):  D ( PM):   Spaghetti with ground [place] and marinara sauce and 1 slice bread and 1 glass of wine. Snk ( PM):  Beverages: unsweet tea, 2 bottles of water  Usual physical activity: Walk twice a day 30 minutes twice a day  Estimated energy needs: 1200  calories 135 g carbohydrates 90 g protein 33 g fat  Progress Towards Goal(s):  In progress.   Nutritional Diagnosis:  NB-1.1 Food and nutrition-related knowledge deficit As related to Overweight, prediabetes and dyslipidemia.  As evidenced by LDL 108, A1C 6% and BMI > 30..    Intervention:  Nutrition and Diabetes education provided on My Plate, CHO counting, meal planning, portion sizes, reading food labels, timing of meals, avoiding snacks between meals  taking medications as prescribed, benefits of exercising 30 minutes per day/150 minutes per week and prevention diabetes type 2. High Fiber Low Salt Diet.   Goals 1. Increase physical activity to 60 minutes three times per week. 2. Lose  2-3 lbs per month Increase water intake. Get a FIB BIT.  Teaching Method Utilized:  Visual Auditory Hands on  Handouts given during visit include:  The Plate Method   Meal Plan Card    Barriers to learning/adherence to  lifestyle change: none  Demonstrated degree of understanding via:  Teach Back   Monitoring/Evaluation:  Dietary intake, exercise, meal planning, and body weight in 3 month(s).

## 2017-01-22 MED FILL — LISINOPRIL 10 MG TABS: 10 | 30 days supply | Qty: 30 | Fill #2

## 2017-01-26 ENCOUNTER — Telehealth: Payer: Self-pay | Admitting: *Deleted

## 2017-01-26 ENCOUNTER — Other Ambulatory Visit: Payer: Self-pay | Admitting: Family Medicine

## 2017-01-26 ENCOUNTER — Encounter: Payer: Self-pay | Admitting: Family Medicine

## 2017-01-26 DIAGNOSIS — H938X3 Other specified disorders of ear, bilateral: Secondary | ICD-10-CM

## 2017-01-26 NOTE — Telephone Encounter (Signed)
Patient called left message requesting a referral to the ENT Dr Lucia Gaskins [phone] for fluid behind her ears. Please advise

## 2017-01-26 NOTE — Telephone Encounter (Signed)
Ok to refer? If so, what diagnosis? 

## 2017-01-26 NOTE — Telephone Encounter (Signed)
I have referred her, and also notified her in a pt message

## 2017-01-26 NOTE — Telephone Encounter (Signed)
Will refer, and I amsendiung her a message to let her know

## 2017-02-04 ENCOUNTER — Other Ambulatory Visit: Payer: Self-pay

## 2017-02-04 ENCOUNTER — Telehealth: Payer: Self-pay | Admitting: *Deleted

## 2017-02-04 MED ORDER — AMPHETAMINE-DEXTROAMPHET ER 30 MG PO CP24: 30.0000 mg | ORAL_CAPSULE | ORAL | 0 refills | Status: DC

## 2017-02-04 NOTE — Telephone Encounter (Signed)
Print 1 nmoth please this m,ay be collected, needs to keep appt

## 2017-02-04 NOTE — Telephone Encounter (Signed)
Can pick up 1 rx now. Will need to keep her dec appt to get her regular 3 rx refills

## 2017-02-04 NOTE — Telephone Encounter (Signed)
Has appt scheduled 12/6 for this reason but wants to get it today. Last filled 12/31/16. What would you like me to tell patient?

## 2017-02-04 NOTE — Telephone Encounter (Signed)
Patient called and states she need a refill of her Adderall. Patient states you can call when the rx is read for pick up. Her contact number is [phone]. Please advise. Thank you

## 2017-02-10 DIAGNOSIS — I10 Essential (primary) hypertension: Secondary | ICD-10-CM | POA: Diagnosis not present

## 2017-02-10 DIAGNOSIS — E785 Hyperlipidemia, unspecified: Secondary | ICD-10-CM | POA: Diagnosis not present

## 2017-02-10 DIAGNOSIS — R7303 Prediabetes: Secondary | ICD-10-CM | POA: Diagnosis not present

## 2017-02-11 LAB — LIPID PANEL
CHOL/HDL RATIO: 3.3 (calc) (ref <5.0)
CHOLESTEROL: 149 mg/dL (ref <200)
HDL: 45 mg/dL — AB (ref >50)
LDL CHOLESTEROL (CALC): 81 mg/dL
Non-HDL Cholesterol (Calc): 104 mg/dL (calc) (ref <130)
TRIGLYCERIDES: 132 mg/dL (ref <150)

## 2017-02-11 LAB — COMPLETE METABOLIC PANEL WITH GFR
AG RATIO: 1.4 (calc) (ref 1.0–2.5)
ALT: 21 U/L (ref 6–29)
AST: 18 U/L (ref 10–35)
Albumin: 4.3 g/dL (ref 3.6–5.1)
Alkaline phosphatase (APISO): 109 U/L (ref 33–130)
BILIRUBIN TOTAL: 0.5 mg/dL (ref 0.2–1.2)
BUN: 12 mg/dL (ref 7–25)
CHLORIDE: 102 mmol/L (ref 98–110)
CO2: 30 mmol/L (ref 20–32)
Calcium: 9.6 mg/dL (ref 8.6–10.4)
Creat: 0.81 mg/dL (ref 0.50–1.05)
GFR, Est African American: 97 mL/min/{1.73_m2} (ref >=60)
GFR, Est Non African American: 84 mL/min/{1.73_m2} (ref >=60)
GLOBULIN: 3 g/dL (ref 1.9–3.7)
Glucose, Bld: 140 mg/dL — ABNORMAL HIGH (ref 65–99)
POTASSIUM: 4.5 mmol/L (ref 3.5–5.3)
SODIUM: 138 mmol/L (ref 135–146)
Total Protein: 7.3 g/dL (ref 6.1–8.1)

## 2017-02-11 LAB — HEMOGLOBIN A1C
HEMOGLOBIN A1C: 6 %{Hb} — AB (ref <5.7)
Mean Plasma Glucose: 126 (calc)
eAG (mmol/L): 7 (calc)

## 2017-02-12 ENCOUNTER — Ambulatory Visit (INDEPENDENT_AMBULATORY_CARE_PROVIDER_SITE_OTHER): Payer: 59 | Admitting: Family Medicine

## 2017-02-12 ENCOUNTER — Encounter: Payer: Self-pay | Admitting: Family Medicine

## 2017-02-12 VITALS — BP 118/80 | HR 111 | Resp 16 | Ht 61.0 in | Wt 167.1 lb

## 2017-02-12 DIAGNOSIS — E669 Obesity, unspecified: Secondary | ICD-10-CM

## 2017-02-12 DIAGNOSIS — R5382 Chronic fatigue, unspecified: Secondary | ICD-10-CM | POA: Diagnosis not present

## 2017-02-12 DIAGNOSIS — R7303 Prediabetes: Secondary | ICD-10-CM | POA: Diagnosis not present

## 2017-02-12 DIAGNOSIS — R232 Flushing: Secondary | ICD-10-CM | POA: Diagnosis not present

## 2017-02-12 DIAGNOSIS — E785 Hyperlipidemia, unspecified: Secondary | ICD-10-CM

## 2017-02-12 DIAGNOSIS — G9332 Myalgic encephalomyelitis/chronic fatigue syndrome: Secondary | ICD-10-CM

## 2017-02-12 DIAGNOSIS — I639 Cerebral infarction, unspecified: Secondary | ICD-10-CM

## 2017-02-12 DIAGNOSIS — N3001 Acute cystitis with hematuria: Secondary | ICD-10-CM

## 2017-02-12 DIAGNOSIS — I10 Essential (primary) hypertension: Secondary | ICD-10-CM | POA: Diagnosis not present

## 2017-02-12 LAB — POCT URINALYSIS DIPSTICK
GLUCOSE UA: NEGATIVE
KETONES UA: NEGATIVE
Nitrite, UA: NEGATIVE
Spec Grav, UA: 1.025 (ref 1.010–1.025)
Urobilinogen, UA: 1 E.U./dL
pH, UA: 6 (ref 5.0–8.0)

## 2017-02-12 MED ORDER — ATORVASTATIN CALCIUM 20 MG PO TABS: 20.0000 mg | ORAL_TABLET | Freq: Every day | ORAL | 11 refills | Status: DC

## 2017-02-12 MED ORDER — VENLAFAXINE HCL ER 150 MG PO CP24: 150.0000 mg | ORAL_CAPSULE | Freq: Every day | ORAL | 1 refills | Status: DC

## 2017-02-12 MED ORDER — CIPROFLOXACIN HCL 500 MG PO TABS: 500.0000 mg | ORAL_TABLET | Freq: Two times a day (BID) | ORAL | 0 refills | Status: DC

## 2017-02-12 MED ORDER — AMPHETAMINE-DEXTROAMPHET ER 30 MG PO CP24: 30.0000 mg | ORAL_CAPSULE | ORAL | 0 refills | Status: DC

## 2017-02-12 MED ORDER — LISINOPRIL 10 MG PO TABS: 10.0000 mg | ORAL_TABLET | Freq: Every day | ORAL | 11 refills | Status: DC

## 2017-02-12 MED ORDER — FLUCONAZOLE 150 MG PO TABS: 150.0000 mg | ORAL_TABLET | Freq: Once | ORAL | 0 refills | Status: AC

## 2017-02-12 MED FILL — ADDERALL XR 30 MG CAP SA: 30 | 90 days supply | Qty: 90 | Fill #0

## 2017-02-12 MED FILL — FLUCONAZOLE 150 MG TABLET: 150 | 1 days supply | Qty: 1 | Fill #0

## 2017-02-12 MED FILL — ATORVASTATIN 20 MG TABLET: 20 | 30 days supply | Qty: 30 | Fill #0

## 2017-02-12 MED FILL — CIPROFLOXACIN HCL 500 MG TA: 500 | 3 days supply | Qty: 6 | Fill #0

## 2017-02-12 NOTE — Patient Instructions (Addendum)
F/u in 4 months, call if you need me before   Please commit to daily exercise for 30 minutes    You are given a 90 day supply of adderall at this visit, I will provide an additional script when it is next due    HBA1C, lipid , cmp and EGFR in 4 months    90 days with 3 refills on meds that can be refilled are sent in  Congrats  On 5 pound weight loss  You are treated for UTI , ciprofloxacin and one tablet for yeast infection are sent to your pharmacy  Thank you  for choosing [facility]. We consider it a privelige to serve you.  Delivering excellent health care in a caring and  compassionate way is our goal.  Partnering with you,  so that together we can achieve this goal is our strategy.   All the best to you and your family for 2019!

## 2017-02-13 LAB — URINE CULTURE
MICRO NUMBER:: 81375145
SPECIMEN QUALITY:: ADEQUATE

## 2017-02-14 ENCOUNTER — Encounter: Payer: Self-pay | Admitting: Family Medicine

## 2017-02-15 NOTE — Progress Notes (Signed)
Carmen Mcintyre     MRN: [id number]      DOB: 1966/01/20   HPI Carmen Mcintyre is here for follow up and re-evaluation of chronic medical conditions, medication management and review of any available recent lab and radiology data.  Preventive health is updated, specifically  Cancer screening and Immunization.  Still needs colonoscopy Questions or concerns regarding consultations or procedures which the PT has had in the interim are  addressed. The PT denies any adverse reactions to current medications since the last visit.  Has worked on food choice and portion size and she sees the nutritionists, has successfully lost 5 pounds which is great, labs need to follow C/o wheeze after walking in th cold , and her exercise routine needs to increase , doing on average 2 to 3 days per week, will work on this. Not experiencing excess hot flashes 1 day h/o frequency and dysuria  ROS Denies recent fever or chills. Denies sinus pressure, nasal congestion, ear pain or sore throat. Denies chest congestion, productive cough or wheezing. Denies chest pains, palpitations and leg swelling Denies abdominal pain, nausea, vomiting,diarrhea or constipation.   Denies dysuria, frequency, hesitancy or incontinence. Denies joint pain, swelling and limitation in mobility. Denies headaches, seizures, numbness, or tingling. Denies depression, anxiety or insomnia. Denies skin break down or rash. Requests 90 day supplies of medications   PE  BP 118/80   Pulse (!) 111   Resp 16   Ht 5\' 1"  (1.549 m)   Wt 167 lb 1.9 oz (75.8 kg)   SpO2 98%   BMI 31.58 kg/m   Patient alert and oriented and in no cardiopulmonary distress.  HEENT: No facial asymmetry, EOMI,   oropharynx pink and moist.  Neck supple no JVD, no mass.  Chest: Clear to auscultation bilaterally.  CVS: S1, S2 no murmurs, no S3.Regular rate.  ABD: Soft non tender. No renal angle tenderness Ext: No edema  MS: Adequate ROM spine, shoulders, hips and  knees.  Skin: Intact, no ulcerations or rash noted.  Psych: Good eye contact, normal affect. Memory intact not anxious or depressed appearing.  CNS: CN 2-12 intact, power,  normal throughout.no focal deficits noted.   Assessment & Plan HTN (hypertension) Controlled, no change in medication DASH diet and commitment to daily physical activity for a minimum of 30 minutes discussed and encouraged, as a part of hypertension management. The importance of attaining a healthy weight is also discussed.  BP/Weight 02/12/2017 01/20/2017 11/20/2016 11/11/2016 10/31/2016 10/30/2016 5/64/3329  Systolic BP 518 - - 841 660 - 630  Diastolic BP 80 - - 72 79 - 82  Wt. (Lbs) 167.12 169 175 172 - 165 177  BMI 31.58 31.93 33.07 32.5 - 31.18 33.44       Obesity (BMI 30.0-34.9) Improved , she is applauded on this, and encouraged to continue same. Patient re-educated about  the importance of commitment to a  minimum of 150 minutes of exercise per week.  The importance of healthy food choices with portion control discussed. Encouraged to start a food diary, count calories and to consider  joining a support group. Sample diet sheets offered. Goals set by the patient for the next several months.   Weight /BMI 02/12/2017 01/20/2017 11/20/2016  WEIGHT 167 lb 1.9 oz 169 lb 175 lb  HEIGHT 5\' 1"  - 5\' 1"   BMI 31.58 kg/m2 31.93 kg/m2 33.07 kg/m2      Hyperlipidemia LDL goal <70 Hyperlipidemia:Low fat diet discussed and encouraged.   Lipid Panel  Lab Results  Component Value Date   CHOL 149 02/10/2017   HDL 45 (L) 02/10/2017   LDLCALC 108 (H) 10/31/2016   TRIG 132 02/10/2017   CHOLHDL 3.3 02/10/2017   Needs to increase exercise to improve hDL, will specifically request LDL for next lab draw in 4 months    Hot flashes Marginally symptomatic off estrogen which is good, she will continue effexor  CHRONIC FATIGUE SYNDROME Continue current dose adder all, 90 day supply written she will collect between  visits, her f/u is 4 months  Stroke (cerebrum) (HCC) No c/o motor or sensory deficit since last visit. Aggressive management of co morbidities which increase stroke risk to be continued, and she will f/u with neurology as planned, continue uncoated daily aspirin  Acute cystitis with hematuria 1 day h/o symptoms with abnormal CCUA , will treat presumptive;ly and follow up on c/s

## 2017-02-15 NOTE — Assessment & Plan Note (Signed)
No c/o motor or sensory deficit since last visit. Aggressive management of co morbidities which increase stroke risk to be continued, and she will f/u with neurology as planned, continue uncoated daily aspirin

## 2017-02-15 NOTE — Assessment & Plan Note (Signed)
Controlled, no change in medication DASH diet and commitment to daily physical activity for a minimum of 30 minutes discussed and encouraged, as a part of hypertension management. The importance of attaining a healthy weight is also discussed.  BP/Weight 02/12/2017 01/20/2017 11/20/2016 11/11/2016 10/31/2016 10/30/2016 2/81/1886  Systolic BP 773 - - 736 681 - 594  Diastolic BP 80 - - 72 79 - 82  Wt. (Lbs) 167.12 169 175 172 - 165 177  BMI 31.58 31.93 33.07 32.5 - 31.18 33.44

## 2017-02-15 NOTE — Assessment & Plan Note (Signed)
Continue current dose adder all, 90 day supply written she will collect between visits, her f/u is 4 months

## 2017-02-15 NOTE — Assessment & Plan Note (Signed)
Marginally symptomatic off estrogen which is good, she will continue effexor

## 2017-02-15 NOTE — Assessment & Plan Note (Signed)
Improved , she is applauded on this, and encouraged to continue same. Patient re-educated about  the importance of commitment to a  minimum of 150 minutes of exercise per week.  The importance of healthy food choices with portion control discussed. Encouraged to start a food diary, count calories and to consider  joining a support group. Sample diet sheets offered. Goals set by the patient for the next several months.   Weight /BMI 02/12/2017 01/20/2017 11/20/2016  WEIGHT 167 lb 1.9 oz 169 lb 175 lb  HEIGHT 5\' 1"  - 5\' 1"   BMI 31.58 kg/m2 31.93 kg/m2 33.07 kg/m2

## 2017-02-15 NOTE — Assessment & Plan Note (Signed)
1 day h/o symptoms with abnormal CCUA , will treat presumptive;ly and follow up on c/s

## 2017-02-15 NOTE — Assessment & Plan Note (Addendum)
Hyperlipidemia:Low fat diet discussed and encouraged.   Lipid Panel  Lab Results  Component Value Date   CHOL 149 02/10/2017   HDL 45 (L) 02/10/2017   LDLCALC 108 (H) 10/31/2016   TRIG 132 02/10/2017   CHOLHDL 3.3 02/10/2017   Needs to increase exercise to improve hDL, will specifically request LDL for next lab draw in 4 months

## 2017-02-24 DIAGNOSIS — Z23 Encounter for immunization: Secondary | ICD-10-CM | POA: Diagnosis not present

## 2017-02-24 DIAGNOSIS — L814 Other melanin hyperpigmentation: Secondary | ICD-10-CM | POA: Diagnosis not present

## 2017-02-24 DIAGNOSIS — L738 Other specified follicular disorders: Secondary | ICD-10-CM | POA: Diagnosis not present

## 2017-02-24 DIAGNOSIS — L570 Actinic keratosis: Secondary | ICD-10-CM | POA: Diagnosis not present

## 2017-02-24 DIAGNOSIS — L821 Other seborrheic keratosis: Secondary | ICD-10-CM | POA: Diagnosis not present

## 2017-02-24 DIAGNOSIS — Z411 Encounter for cosmetic surgery: Secondary | ICD-10-CM | POA: Diagnosis not present

## 2017-02-24 DIAGNOSIS — L719 Rosacea, unspecified: Secondary | ICD-10-CM | POA: Diagnosis not present

## 2017-03-02 MED FILL — LISINOPRIL 10 MG TABS: 10 | 30 days supply | Qty: 30 | Fill #3

## 2017-03-04 ENCOUNTER — Other Ambulatory Visit: Payer: Self-pay

## 2017-03-04 DIAGNOSIS — R232 Flushing: Secondary | ICD-10-CM

## 2017-03-04 DIAGNOSIS — R7303 Prediabetes: Secondary | ICD-10-CM

## 2017-03-04 MED ORDER — VENLAFAXINE HCL ER 150 MG PO CP24: 150.0000 mg | ORAL_CAPSULE | Freq: Every day | ORAL | 1 refills | Status: DC

## 2017-03-04 MED ORDER — ATORVASTATIN CALCIUM 20 MG PO TABS: 20.0000 mg | ORAL_TABLET | Freq: Every day | ORAL | 1 refills | Status: DC

## 2017-03-04 MED ORDER — LISINOPRIL 10 MG PO TABS: 10.0000 mg | ORAL_TABLET | Freq: Every day | ORAL | 1 refills | Status: DC

## 2017-03-05 DIAGNOSIS — H6123 Impacted cerumen, bilateral: Secondary | ICD-10-CM | POA: Diagnosis not present

## 2017-03-06 MED FILL — VENLAFAXINE HCL ER 150 MG C: 150 | 90 days supply | Qty: 90 | Fill #0

## 2017-03-09 MED FILL — ATORVASTATIN 20 MG TABLET: 20 | 30 days supply | Qty: 30 | Fill #3

## 2017-03-30 MED FILL — LISINOPRIL 10 MG TABS: 10 | 90 days supply | Qty: 90 | Fill #4

## 2017-04-14 MED FILL — ATORVASTATIN 20 MG TABLET: 20 | 90 days supply | Qty: 90 | Fill #4

## 2017-04-21 ENCOUNTER — Ambulatory Visit: Payer: 59 | Admitting: Nutrition

## 2017-05-05 DIAGNOSIS — L719 Rosacea, unspecified: Secondary | ICD-10-CM | POA: Diagnosis not present

## 2017-05-05 DIAGNOSIS — L570 Actinic keratosis: Secondary | ICD-10-CM | POA: Diagnosis not present

## 2017-05-05 DIAGNOSIS — Z23 Encounter for immunization: Secondary | ICD-10-CM | POA: Diagnosis not present

## 2017-05-05 MED FILL — FLUOROURACIL 5% CREAM: 5 | 14 days supply | Qty: 40 | Fill #0

## 2017-05-14 ENCOUNTER — Telehealth: Payer: Self-pay | Admitting: Family Medicine

## 2017-05-14 NOTE — Telephone Encounter (Signed)
PT left VM that she needed to know when she could come by to pick up her adderral

## 2017-05-15 ENCOUNTER — Other Ambulatory Visit: Payer: Self-pay | Admitting: Family Medicine

## 2017-05-15 MED ORDER — AMPHETAMINE-DEXTROAMPHET ER 30 MG PO CP24: 30.0000 mg | ORAL_CAPSULE | Freq: Every day | ORAL | 0 refills | Status: DC

## 2017-05-15 MED FILL — ADDERALL XR 30 MG CAP SA: 30 | 90 days supply | Qty: 90 | Fill #0

## 2017-05-15 NOTE — Telephone Encounter (Signed)
Left voicemail for patient

## 2017-05-15 NOTE — Telephone Encounter (Signed)
pls let her know that [facility],

## 2017-05-15 NOTE — Telephone Encounter (Signed)
Please  Let her know that [facility]

## 2017-05-19 ENCOUNTER — Encounter: Payer: 59 | Attending: Family Medicine | Admitting: Nutrition

## 2017-05-19 VITALS — Ht 61.0 in | Wt 170.0 lb

## 2017-05-19 DIAGNOSIS — R739 Hyperglycemia, unspecified: Secondary | ICD-10-CM

## 2017-05-19 DIAGNOSIS — E669 Obesity, unspecified: Secondary | ICD-10-CM

## 2017-05-19 NOTE — Progress Notes (Signed)
  Medical Nutrition Therapy:  Appt start time: 930 end time:  1000  Assessment:  Second Visit    Primary concerns today:  Overweight, Mini strokes, prediabetes, dyslipidemia.    Gained 3 lbs. Struggles with eating better on her days off. Has been eating out more lately.  She is willing to make bette choices with eating out and less often. Engaged to lose weight. Lab Results  Component Value Date   HGBA1C 6.0 (H) 02/10/2017    CMP Latest Ref Rng & Units 02/10/2017 10/30/2016 10/30/2016  Glucose 65 - 99 mg/dL 140(H) 131(H) 135(H)  BUN 7 - 25 mg/dL 12 11 12   Creatinine 0.50 - 1.05 mg/dL 0.81 0.80 0.80  Sodium 135 - 146 mmol/L 138 139 134(L)  Potassium 3.5 - 5.3 mmol/L 4.5 3.8 3.8  Chloride 98 - 110 mmol/L 102 97(L) 97(L)  CO2 20 - 32 mmol/L 30 - 30  Calcium 8.6 - 10.4 mg/dL 9.6 - 9.1  Total Protein 6.1 - 8.1 g/dL 7.3 - 7.6  Total Bilirubin 0.2 - 1.2 mg/dL 0.5 - 0.2(L)  Alkaline Phos 38 - 126 U/L - - 80  AST 10 - 35 U/L 18 - 24  ALT 6 - 29 U/L 21 - 26   Lipid Panel     Component Value Date/Time   CHOL 149 02/10/2017 0832   TRIG 132 02/10/2017 0832   HDL 45 (L) 02/10/2017 0832   CHOLHDL 3.3 02/10/2017 0832   VLDL 18 10/31/2016 0602   LDLCALC 81 02/10/2017 0832   Preferred Learning Style:  No preference indicated   Learning Readiness:   Ready  Change in progress   MEDICATIONS:   DIETARY INTAKE:   24-hr recall:  B ( AM): Activi yogurt and piece of fruit.   Snk ( AM):   L ( PM): chicken, veggies, apple slices.  Water. Snk ( PM):  D ( PM):   Spaghetti with ground [place] and marinara sauce and 1 slice bread and 1 glass of wine. Snk ( PM):  Beverages: unsweet tea, 2 bottles of water  Usual physical activity: Walk twice a day 30 minutes twice a day  Estimated energy needs: 1200  calories 135 g carbohydrates 90 g protein 33 g fat  Progress Towards Goal(s):  In progress.   Nutritional Diagnosis:  NB-1.1 Food and nutrition-related knowledge deficit As related  to Overweight, prediabetes and dyslipidemia.  As evidenced by LDL 108, A1C 6% and BMI > 30..    Intervention:  Nutrition and Diabetes education provided on My Plate, CHO counting, meal planning, portion sizes, reading food labels, timing of meals, avoiding snacks between meals  taking medications as prescribed, benefits of exercising 30 minutes per day/150 minutes per week and prevention diabetes type 2. High Fiber Low Salt Diet. Goals 1. Keep a food journal using on MyFitness Pal 2. Start going to exercise classes twice a week. 3.  Cut out sweet tea and drink water only-use lemon,  AIm for 1/2 -1 lbs weigh loss. per week. Try Healthy Choice or Smart ones meals Cut out snacks and junk food.  Teaching Method Utilized:  Visual Auditory Hands on  Handouts given during visit include:  The Plate Method   Meal Plan Card    Barriers to learning/adherence to lifestyle change: none  Demonstrated degree of understanding via:  Teach Back   Monitoring/Evaluation:  Dietary intake, exercise, meal planning, and body weight in 3 month(s).

## 2017-05-19 NOTE — Patient Instructions (Signed)
Goals 1. Keep a food journal using on MyFitness Pal 2. Start going to exercise classes twice a week. 3.  Cut out sweet tea and drink water only-use lemon,  AIm for 1/2 -1 lbs weigh loss. per week. Try Healthy Choice or Smart ones meals Cut out snacks and junk food.

## 2017-05-21 ENCOUNTER — Encounter: Payer: Self-pay | Admitting: Nutrition

## 2017-06-02 DIAGNOSIS — L570 Actinic keratosis: Secondary | ICD-10-CM | POA: Diagnosis not present

## 2017-06-05 ENCOUNTER — Encounter: Payer: Self-pay | Admitting: Family Medicine

## 2017-06-07 ENCOUNTER — Other Ambulatory Visit: Payer: Self-pay | Admitting: Family Medicine

## 2017-06-07 DIAGNOSIS — Z1211 Encounter for screening for malignant neoplasm of colon: Secondary | ICD-10-CM

## 2017-06-09 DIAGNOSIS — G450 Vertebro-basilar artery syndrome: Secondary | ICD-10-CM | POA: Diagnosis not present

## 2017-06-09 DIAGNOSIS — G5601 Carpal tunnel syndrome, right upper limb: Secondary | ICD-10-CM | POA: Diagnosis not present

## 2017-06-09 DIAGNOSIS — I10 Essential (primary) hypertension: Secondary | ICD-10-CM | POA: Diagnosis not present

## 2017-06-09 DIAGNOSIS — E782 Mixed hyperlipidemia: Secondary | ICD-10-CM | POA: Diagnosis not present

## 2017-06-16 ENCOUNTER — Ambulatory Visit: Payer: 59 | Admitting: Family Medicine

## 2017-06-18 DIAGNOSIS — R7303 Prediabetes: Secondary | ICD-10-CM | POA: Diagnosis not present

## 2017-06-18 DIAGNOSIS — E785 Hyperlipidemia, unspecified: Secondary | ICD-10-CM | POA: Diagnosis not present

## 2017-06-19 LAB — LIPID PANEL
CHOLESTEROL: 138 mg/dL (ref <200)
HDL: 46 mg/dL — ABNORMAL LOW (ref >50)
LDL CHOLESTEROL (CALC): 69 mg/dL
NON-HDL CHOLESTEROL (CALC): 92 mg/dL (ref <130)
Total CHOL/HDL Ratio: 3 (calc) (ref <5.0)
Triglycerides: 146 mg/dL (ref <150)

## 2017-06-19 LAB — HEMOGLOBIN A1C
HEMOGLOBIN A1C: 6.1 %{Hb} — AB (ref <5.7)
Mean Plasma Glucose: 128 (calc)
eAG (mmol/L): 7.1 (calc)

## 2017-06-19 LAB — COMPLETE METABOLIC PANEL WITH GFR
AG Ratio: 1.6 (calc) (ref 1.0–2.5)
ALKALINE PHOSPHATASE (APISO): 107 U/L (ref 33–130)
ALT: 21 U/L (ref 6–29)
AST: 20 U/L (ref 10–35)
Albumin: 4.6 g/dL (ref 3.6–5.1)
BUN: 12 mg/dL (ref 7–25)
CALCIUM: 9.6 mg/dL (ref 8.6–10.4)
CO2: 32 mmol/L (ref 20–32)
Chloride: 101 mmol/L (ref 98–110)
Creat: 0.79 mg/dL (ref 0.50–1.05)
GFR, Est African American: 100 mL/min/{1.73_m2} (ref >=60)
GFR, Est Non African American: 86 mL/min/{1.73_m2} (ref >=60)
GLUCOSE: 130 mg/dL — AB (ref 65–99)
Globulin: 2.8 g/dL (calc) (ref 1.9–3.7)
Potassium: 4.6 mmol/L (ref 3.5–5.3)
SODIUM: 138 mmol/L (ref 135–146)
Total Bilirubin: 0.6 mg/dL (ref 0.2–1.2)
Total Protein: 7.4 g/dL (ref 6.1–8.1)

## 2017-06-22 ENCOUNTER — Encounter: Payer: Self-pay | Admitting: Family Medicine

## 2017-06-23 ENCOUNTER — Encounter: Payer: 59 | Attending: Family Medicine | Admitting: Nutrition

## 2017-06-23 ENCOUNTER — Encounter: Payer: Self-pay | Admitting: Nutrition

## 2017-06-23 VITALS — Ht 61.0 in | Wt 169.8 lb

## 2017-06-23 DIAGNOSIS — E669 Obesity, unspecified: Secondary | ICD-10-CM | POA: Diagnosis not present

## 2017-06-23 DIAGNOSIS — R739 Hyperglycemia, unspecified: Secondary | ICD-10-CM

## 2017-06-23 NOTE — Patient Instructions (Addendum)
Goals 1. Walk to 2 miles a day 2-3 times per week 2. Keep a food journal daily. 3.  Keep drinking water 4. :Lose 1/2 lb per week.

## 2017-06-23 NOTE — Progress Notes (Signed)
  Medical Nutrition Therapy:  Appt start time: 930  end time:  1000  Assessment:  Third Visit    Primary concerns today:  Overweight, Mini strokes, prediabetes, dyslipidemia.   Cholesterol panel has improved since December along with being on Lipitor. Has been drinking a lot of water and cut out tea and sodas. Has been much more engaged with changes in the last 2 weeks. Lost 1/2 lbs. Has done some exercises classes. Her husband has started making changes with diet also that is supportive. Hasn't started writing food journal. She is willing to make better choices with eating  And increase exercise. Engaged to lose weight.   Lab Results  Component Value Date   HGBA1C 6.1 (H) 06/18/2017    CMP Latest Ref Rng & Units 06/18/2017 02/10/2017 10/30/2016  Glucose 65 - 99 mg/dL 130(H) 140(H) 131(H)  BUN 7 - 25 mg/dL 12 12 11   Creatinine 0.50 - 1.05 mg/dL 0.79 0.81 0.80  Sodium 135 - 146 mmol/L 138 138 139  Potassium 3.5 - 5.3 mmol/L 4.6 4.5 3.8  Chloride 98 - 110 mmol/L 101 102 97(L)  CO2 20 - 32 mmol/L 32 30 -  Calcium 8.6 - 10.4 mg/dL 9.6 9.6 -  Total Protein 6.1 - 8.1 g/dL 7.4 7.3 -  Total Bilirubin 0.2 - 1.2 mg/dL 0.6 0.5 -  Alkaline Phos 38 - 126 U/L - - -  AST 10 - 35 U/L 20 18 -  ALT 6 - 29 U/L 21 21 -   Lipid Panel     Component Value Date/Time   CHOL 138 06/18/2017 0909   TRIG 146 06/18/2017 0909   HDL 46 (L) 06/18/2017 0909   CHOLHDL 3.0 06/18/2017 0909   VLDL 18 10/31/2016 0602   LDLCALC 69 06/18/2017 0909   Preferred Learning Style:  No preference indicated   Learning Readiness:   Ready  Change in progress   MEDICATIONS:   DIETARY INTAKE:   24-hr recall:  B ( AM): Activii yogurt and fruit, Snk ( AM):   L ( PM): same as dinner  Snk ( PM): Dinner: [place] burger, salsa, some peppers, water Snk ( PM):  Beverages: 3  bottles of water  Usual physical activity: Walk twice a day 30 minutes twice a day  Estimated energy needs: 1200  calories 135 g  carbohydrates 90 g protein 33 g fat  Progress Towards Goal(s):  In progress.   Nutritional Diagnosis:  NB-1.1 Food and nutrition-related knowledge deficit As related to Overweight, prediabetes and dyslipidemia.  As evidenced by LDL 108, A1C 6% and BMI > 30..    Intervention:  Nutrition and Diabetes education provided on My Plate, CHO counting, meal planning, portion sizes, reading food labels, timing of meals, avoiding snacks between meals  taking medications as prescribed, benefits of exercising 30 minutes per day/150 minutes per week and prevention diabetes type 2. High Fiber Low Salt Diet.  Goals 1. Walk to 2 miles a day 2-3 times per week 2. Keep a food journal daily. 3.  Keep drinking water 4. :Lose 1/2 lb per week.   Teaching Method Utilized:  Visual Auditory Hands on  Handouts given during visit include:  The Plate Method   Meal Plan Card    Barriers to learning/adherence to lifestyle change: none  Demonstrated degree of understanding via:  Teach Back   Monitoring/Evaluation:  Dietary intake, exercise, meal planning, and body weight in 6 weeks(s).

## 2017-06-25 ENCOUNTER — Other Ambulatory Visit: Payer: Self-pay

## 2017-06-25 ENCOUNTER — Ambulatory Visit (AMBULATORY_SURGERY_CENTER): Payer: 59

## 2017-06-25 ENCOUNTER — Encounter: Payer: Self-pay | Admitting: Internal Medicine

## 2017-06-25 VITALS — Ht 61.0 in | Wt 167.0 lb

## 2017-06-25 DIAGNOSIS — Z1211 Encounter for screening for malignant neoplasm of colon: Secondary | ICD-10-CM

## 2017-06-25 MED ORDER — NA SULFATE-K SULFATE-MG SULF 17.5-3.13-1.6 GM/177ML PO SOLN: 1.0000 | Freq: Once | ORAL | 0 refills | Status: AC

## 2017-06-25 MED ORDER — PEG 3350-KCL-NA BICARB-NACL 420 G PO SOLR: 4000.0000 mL | Freq: Once | ORAL | 0 refills | Status: AC

## 2017-06-25 MED FILL — PEG-3350 SOLUTION: 420 | 1 days supply | Qty: 4000 | Fill #0

## 2017-06-25 NOTE — Progress Notes (Signed)
Denies allergies to eggs or soy products. Denies complication of anesthesia or sedation. Denies use of weight loss medication. Denies use of O2.   Emmi instructions declined.  Patients prep was re ordered because the patient states she knows she cannot tolerate Suprep. She has aversion to berry scented and flavored foods and drinks.

## 2017-06-26 MED FILL — VENLAFAXINE HCL ER 150 MG C: 150 | 90 days supply | Qty: 90 | Fill #1

## 2017-07-03 MED FILL — LISINOPRIL 10 MG TABLET: 10 | 90 days supply | Qty: 90 | Fill #5

## 2017-07-07 ENCOUNTER — Ambulatory Visit: Payer: 59 | Admitting: Family Medicine

## 2017-07-09 ENCOUNTER — Other Ambulatory Visit: Payer: Self-pay

## 2017-07-09 ENCOUNTER — Encounter: Payer: Self-pay | Admitting: Internal Medicine

## 2017-07-09 ENCOUNTER — Ambulatory Visit (AMBULATORY_SURGERY_CENTER): Payer: 59 | Admitting: Internal Medicine

## 2017-07-09 VITALS — BP 102/63 | HR 81 | Temp 98.2°F | Resp 21 | Ht 61.0 in | Wt 167.0 lb

## 2017-07-09 DIAGNOSIS — Z1212 Encounter for screening for malignant neoplasm of rectum: Secondary | ICD-10-CM

## 2017-07-09 DIAGNOSIS — Z1211 Encounter for screening for malignant neoplasm of colon: Secondary | ICD-10-CM | POA: Diagnosis present

## 2017-07-09 MED ORDER — SODIUM CHLORIDE 0.9 % IV SOLN: 500.0000 mL | Freq: Once | INTRAVENOUS | Status: DC

## 2017-07-09 NOTE — Progress Notes (Signed)
Pt's states no medical or surgical changes since previsit or office visit. 

## 2017-07-09 NOTE — Patient Instructions (Signed)
  Thank you for allowing us to care for you today!     YOU HAD AN ENDOSCOPIC PROCEDURE TODAY AT THE [facility]:   Refer to the procedure report that was given to you for any specific questions about what was found during the examination.  If the procedure report does not answer your questions, please call your gastroenterologist to clarify.  If you requested that your care partner not be given the details of your procedure findings, then the procedure report has been included in a sealed envelope for you to review at your convenience later.  YOU SHOULD EXPECT: Some feelings of bloating in the abdomen. Passage of more gas than usual.  Walking can help get rid of the air that was put into your GI tract during the procedure and reduce the bloating. If you had a lower endoscopy (such as a colonoscopy or flexible sigmoidoscopy) you may notice spotting of blood in your stool or on the toilet paper. If you underwent a bowel prep for your procedure, you may not have a normal bowel movement for a few days.  Please Note:  You might notice some irritation and congestion in your nose or some drainage.  This is from the oxygen used during your procedure.  There is no need for concern and it should clear up in a day or so.  SYMPTOMS TO REPORT IMMEDIATELY:   Following lower endoscopy (colonoscopy or flexible sigmoidoscopy):  Excessive amounts of blood in the stool  Significant tenderness or worsening of abdominal pains  Swelling of the abdomen that is new, acute  Fever of 100F or higher    For urgent or emergent issues, a gastroenterologist can be reached at any hour by calling [phone].   DIET:  We do recommend a small meal at first, but then you may proceed to your regular diet.  Drink plenty of fluids but you should avoid alcoholic beverages for 24 hours.  ACTIVITY:  You should plan to take it easy for the rest of today and you should NOT DRIVE or use heavy machinery until  tomorrow (because of the sedation medicines used during the test).    FOLLOW UP: Our staff will call the number listed on your records the next business day following your procedure to check on you and address any questions or concerns that you may have regarding the information given to you following your procedure. If we do not reach you, we will leave a message.  However, if you are feeling well and you are not experiencing any problems, there is no need to return our call.  We will assume that you have returned to your regular daily activities without incident.  If any biopsies were taken you will be contacted by phone or by letter within the next 1-3 weeks.  Please call us at [phone] if you have not heard about the biopsies in 3 weeks.    SIGNATURES/CONFIDENTIALITY: You and/or your care partner have signed paperwork which will be entered into your electronic medical record.  These signatures attest to the fact that that the information above on your After Visit Summary has been reviewed and is understood.  Full responsibility of the confidentiality of this discharge information lies with you and/or your care-partner. 

## 2017-07-09 NOTE — Progress Notes (Signed)
To PACU, VSS. Report to [email or web address] 

## 2017-07-09 NOTE — Op Note (Signed)
[facility] Patient Name: Carmen Mcintyre Procedure Date: 07/09/2017 9:04 AM MRN: [id number] Endoscopist: Jerene Bears , MD Age: 52 Referring MD:  Date of Birth: 01-Oct-1965 Gender: Female Account #: [id number] Procedure:                Colonoscopy Indications:              Screening for colorectal malignant neoplasm, This                            is the patient's first colonoscopy Medicines:                Monitored Anesthesia Care Procedure:                Pre-Anesthesia Assessment:                           - Prior to the procedure, a History and Physical                            was performed, and patient medications and                            allergies were reviewed. The patient's tolerance of                            previous anesthesia was also reviewed. The risks                            and benefits of the procedure and the sedation                            options and risks were discussed with the patient.                            All questions were answered, and informed consent                            was obtained. Prior Anticoagulants: The patient has                            taken no previous anticoagulant or antiplatelet                            agents. ASA Grade Assessment: II - A patient with                            mild systemic disease. After reviewing the risks                            and benefits, the patient was deemed in                            satisfactory condition to undergo the procedure.  After obtaining informed consent, the colonoscope                            was passed under direct vision. Throughout the                            procedure, the patient's blood pressure, pulse, and                            oxygen saturations were monitored continuously. The                            Model PCF-H190DL [id number]) scope was introduced                            through the anus and  advanced to the the cecum,                            identified by appendiceal orifice and ileocecal                            valve. The colonoscopy was performed without                            difficulty. The patient tolerated the procedure                            well. The quality of the bowel preparation was                            good. The ileocecal valve, appendiceal orifice, and                            rectum were photographed. Scope In: 9:12:11 AM Scope Out: 9:24:50 AM Scope Withdrawal Time: 0 hours 9 minutes 46 seconds  Total Procedure Duration: 0 hours 12 minutes 39 seconds  Findings:                 The digital rectal exam was normal.                           The entire examined colon appeared normal on direct                            and retroflexion views. Complications:            No immediate complications. Estimated Blood Loss:     Estimated blood loss: none. Impression:               - The entire examined colon is normal on direct and                            retroflexion views.                           - No specimens collected.  Recommendation:           - Patient has a contact number available for                            emergencies. The signs and symptoms of potential                            delayed complications were discussed with the                            patient. Return to normal activities tomorrow.                            Written discharge instructions were provided to the                            patient.                           - Resume previous diet.                           - Continue present medications.                           - Repeat colonoscopy in 10 years for screening                            purposes. Jerene Bears, MD 07/09/2017 9:26:59 AM This report has been signed electronically.

## 2017-07-10 ENCOUNTER — Encounter: Payer: Self-pay | Admitting: Family Medicine

## 2017-07-10 ENCOUNTER — Telehealth: Payer: Self-pay | Admitting: *Deleted

## 2017-07-10 ENCOUNTER — Telehealth: Payer: Self-pay

## 2017-07-10 NOTE — Telephone Encounter (Signed)
  Follow up Call-  Call back number 07/09/2017  Post procedure Call Back phone  # [phone]  Permission to leave phone message Yes  Some recent data might be hidden     No answer.  Will try again around mid-day. Carmen Mcintyre/Call-back [facility]

## 2017-07-10 NOTE — Telephone Encounter (Signed)
  Follow up Call-  Call back number 07/09/2017  Post procedure Call Back phone  # [phone]  Permission to leave phone message Yes  Some recent data might be hidden     Patient questions:  Do you have a fever, pain , or abdominal swelling? No. Pain Score  0 *  Have you tolerated food without any problems? Yes.    Have you been able to return to your normal activities? Yes.    Do you have any questions about your discharge instructions: Diet   No. Medications  No. Follow up visit  No.  Do you have questions or concerns about your Care? No.  Actions: * If pain score is 4 or above: No action needed, pain <4.

## 2017-07-17 MED FILL — ATORVASTATIN 20 MG TABLET: 20 | 90 days supply | Qty: 90 | Fill #5

## 2017-08-06 ENCOUNTER — Ambulatory Visit (INDEPENDENT_AMBULATORY_CARE_PROVIDER_SITE_OTHER): Payer: 59 | Admitting: Family Medicine

## 2017-08-06 ENCOUNTER — Encounter: Payer: Self-pay | Admitting: Family Medicine

## 2017-08-06 VITALS — BP 122/78 | HR 114 | Resp 16 | Ht 61.0 in | Wt 168.0 lb

## 2017-08-06 DIAGNOSIS — E66811 Obesity, class 1: Secondary | ICD-10-CM

## 2017-08-06 DIAGNOSIS — R7303 Prediabetes: Secondary | ICD-10-CM

## 2017-08-06 DIAGNOSIS — I10 Essential (primary) hypertension: Secondary | ICD-10-CM

## 2017-08-06 DIAGNOSIS — R232 Flushing: Secondary | ICD-10-CM

## 2017-08-06 DIAGNOSIS — E785 Hyperlipidemia, unspecified: Secondary | ICD-10-CM | POA: Diagnosis not present

## 2017-08-06 DIAGNOSIS — F418 Other specified anxiety disorders: Secondary | ICD-10-CM | POA: Diagnosis not present

## 2017-08-06 DIAGNOSIS — G9332 Myalgic encephalomyelitis/chronic fatigue syndrome: Secondary | ICD-10-CM

## 2017-08-06 DIAGNOSIS — E8881 Metabolic syndrome: Secondary | ICD-10-CM

## 2017-08-06 DIAGNOSIS — E669 Obesity, unspecified: Secondary | ICD-10-CM | POA: Diagnosis not present

## 2017-08-06 DIAGNOSIS — R5382 Chronic fatigue, unspecified: Secondary | ICD-10-CM | POA: Diagnosis not present

## 2017-08-06 MED ORDER — AMPHETAMINE-DEXTROAMPHET ER 30 MG PO CP24: 30.0000 mg | ORAL_CAPSULE | ORAL | 0 refills | Status: DC

## 2017-08-06 MED ORDER — VENLAFAXINE HCL ER 150 MG PO CP24: 150.0000 mg | ORAL_CAPSULE | Freq: Every day | ORAL | 1 refills | Status: DC

## 2017-08-06 MED ORDER — METFORMIN HCL 500 MG PO TABS: ORAL_TABLET | ORAL | 3 refills | Status: DC

## 2017-08-06 MED ORDER — ATORVASTATIN CALCIUM 20 MG PO TABS: 20.0000 mg | ORAL_TABLET | Freq: Every day | ORAL | 1 refills | Status: DC

## 2017-08-06 MED FILL — metFORMIN HCL 500 MG TABS: 500 | 90 days supply | Qty: 90 | Fill #0

## 2017-08-06 NOTE — Patient Instructions (Signed)
F/U in 12 weeks, call if you need me before  I will reach out to Dr Merlene Laughter  Re sleepiness   Non fast HBA1C , and chem 7 and EGFR in 1 week before f/u  New is metformin one daily Please commit to daily exercise  All the best   New pharmacy is [facility]  Thank you  for choosing [facility]. We consider it a privelige to serve you.  Delivering excellent health care in a caring and  compassionate way is our goal.  Partnering with you,  so that together we can achieve this goal is our strategy.

## 2017-08-09 ENCOUNTER — Encounter: Payer: Self-pay | Admitting: Family Medicine

## 2017-08-09 NOTE — Assessment & Plan Note (Signed)
Patient educated about the importance of limiting  Carbohydrate intake , the need to commit to daily physical activity for a minimum of 30 minutes , and to commit weight loss. The fact that changes in all these areas will reduce or eliminate all together the development of diabetes is stressed.   Diabetic Labs Latest Ref Rng & Units 06/18/2017 02/10/2017 10/31/2016 10/30/2016 10/30/2016  HbA1c <5.7 % of total Hgb 6.1(H) 6.0(H) - - 6.0(H)  Chol <200 mg/dL 138 149 171 - -  HDL >50 mg/dL 46(L) 45(L) 45 - -  Calc LDL mg/dL (calc) 69 81 108(H) - -  Triglycerides <150 mg/dL 146 132 89 - -  Creatinine 0.50 - 1.05 mg/dL 0.79 0.81 - 0.80 0.80   BP/Weight 08/06/2017 07/09/2017 06/25/2017 06/23/2017 05/19/2017 02/12/2017 12/45/8099  Systolic BP 833 825 - - - 053 -  Diastolic BP 78 63 - - - 80 -  Wt. (Lbs) 168 167 167 169.8 170 167.12 169  BMI 31.74 31.55 31.55 32.08 32.12 31.58 31.93   Deteriorated, start metformin 500 mg daily, return in 3 months with lab , HBA1C and chem 7 and EGFR, hopefully this will also assist with weight loss

## 2017-08-09 NOTE — Assessment & Plan Note (Signed)
No medication change indicated. Pt has great support from her community of Faith and does not feel that though having increased stress associated with upcoming stress of separation from her older child when she laughingly said she would need xanax, she will benefit from telepsych, it may become a feasible option for her and I may need to remind her of that

## 2017-08-09 NOTE — Assessment & Plan Note (Signed)
The increased risk of cardiovascular disease associated with this diagnosis, and the need to consistently work on lifestyle to change this is discussed. Following  a  heart healthy diet ,commitment to 30 minutes of exercise at least 5 days per week, as well as control of blood sugar and cholesterol , and achieving a healthy weight are all the areas to be addressed .  

## 2017-08-09 NOTE — Assessment & Plan Note (Signed)
Hyperlipidemia:Low fat diet discussed and encouraged.   Lipid Panel  Lab Results  Component Value Date   CHOL 138 06/18/2017   HDL 46 (L) 06/18/2017   LDLCALC 69 06/18/2017   TRIG 146 06/18/2017   CHOLHDL 3.0 06/18/2017   At goal , no med change Needs to commit to more regular exercise

## 2017-08-09 NOTE — Assessment & Plan Note (Signed)
Unchanged, sh is to continue with the dietician Patient re-educated about  the importance of commitment to a  minimum of 150 minutes of exercise per week.  The importance of healthy food choices with portion control discussed.     Weight /BMI 08/06/2017 07/09/2017 06/25/2017  WEIGHT 168 lb 167 lb 167 lb  HEIGHT 5\' 1"  5\' 1"  5\' 1"   BMI 31.74 kg/m2 31.55 kg/m2 31.55 kg/m2

## 2017-08-09 NOTE — Assessment & Plan Note (Signed)
Controlled, no change in medication DASH diet and commitment to daily physical activity for a minimum of 30 minutes discussed and encouraged, as a part of hypertension management. The importance of attaining a healthy weight is also discussed.  BP/Weight 08/06/2017 07/09/2017 06/25/2017 06/23/2017 05/19/2017 02/12/2017 76/54/6503  Systolic BP 546 568 - - - 127 -  Diastolic BP 78 63 - - - 80 -  Wt. (Lbs) 168 167 167 169.8 170 167.12 169  BMI 31.74 31.55 31.55 32.08 32.12 31.58 31.93

## 2017-08-09 NOTE — Assessment & Plan Note (Signed)
Reports worsening of her symptoms with a nadir around 1400 every day which makes it very difficult for her to defectively do her work  Currently on the maximum dose of adder all, will reach out to her neurologist for assistance, curbside before formal referral , she agrees

## 2017-08-09 NOTE — Progress Notes (Signed)
Carmen Mcintyre     MRN: [id number]      DOB: 08-Jun-1965   HPI Carmen Mcintyre is here for follow up and re-evaluation of chronic medical conditions, medication management and review of any available recent lab and radiology data.  Preventive health is updated, specifically  Cancer screening and Immunization.   Questions or concerns regarding consultations or procedures which the PT has had in the interim are  Addressed.Carmen Mcintyre has seen Neurology since  Her last visit and has also been to the nutritionist. Exercise is sporadic, weight is unchanged and blood sugar has increased Still c/o chronic fatigue, denies snoring, states that by 2 pm Carmen Mcintyre "tanks" and has very poor focus , noted by her colleagues at work sometimes. Works 3 days per week x 6 months for 8 hrs/day and Carmen Mcintyre enjoys this.Symptoms have worsened in the past 2 months. 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 Carmen Mcintyre does not have insomnia  The PT denies any adverse reactions to current medications since the last visit.     ROS Denies recent fever or chills. Denies sinus pressure, nasal congestion, ear pain or sore throat. Denies chest congestion, productive cough or wheezing. Denies chest pains, palpitations and leg swelling Denies abdominal pain, nausea, vomiting,diarrhea or constipation.   Denies dysuria, frequency, hesitancy or incontinence. Denies joint pain, swelling and limitation in mobility. Denies headaches, seizures, numbness, or tingling.  Denies skin break down or rash.   PE  BP 122/78   Pulse (!) 114   Resp 16   Ht '5\' 1"'  (1.549 m)   Wt 168 lb (76.2 kg)   LMP 08/22/2016   SpO2 98%   BMI 31.74 kg/m   Patient alert and oriented and in no cardiopulmonary distress.  HEENT: No facial asymmetry, EOMI,   oropharynx pink and moist.  Neck supple no JVD, no mass.  Chest: Clear  to auscultation bilaterally.  CVS: S1, S2 no murmurs, no S3.Regular rate.  ABD: Soft non tender.   Ext: No edema  MS: Adequate ROM spine, shoulders, hips and knees.  Skin: Intact, no ulcerations or rash noted.  Psych: Good eye contact, normal affect. Memory intact not anxious or depressed appearing.  CNS: CN 2-12 intact, power,  normal throughout.no focal deficits noted.   Assessment & Plan  HTN (hypertension) Controlled, no change in medication DASH diet and commitment to daily physical activity for a minimum of 30 minutes discussed and encouraged, as a part of hypertension management. The importance of attaining a healthy weight is also discussed.  BP/Weight 08/06/2017 07/09/2017 06/25/2017 06/23/2017 05/19/2017 02/12/2017 09/18/1973  Systolic BP 883 254 - - - 982 -  Diastolic BP 78 63 - - - 80 -  Wt. (Lbs) 168 167 167 169.8 170 167.12 169  BMI 31.74 31.55 31.55 32.08 32.12 31.58 31.93       Obesity (BMI 30.0-34.9) Unchanged, sh is to continue with the dietician Patient re-educated about  the importance of commitment to a  minimum of 150 minutes of exercise per week.  The importance of healthy food choices with portion control discussed.     Weight /BMI 08/06/2017 07/09/2017 06/25/2017  WEIGHT 168 lb 167 lb 167 lb  HEIGHT '5\' 1"'  '5\' 1"'  '5\' 1"'   BMI 31.74 kg/m2 31.55 kg/m2 31.55 kg/m2      Prediabetes Patient educated about  the importance of limiting  Carbohydrate intake , the need to commit to daily physical activity for a minimum of 30 minutes , and to commit weight loss. The fact that changes in all these areas will reduce or eliminate all together the development of diabetes is stressed.   Diabetic Labs Latest Ref Rng & Units 06/18/2017 02/10/2017 10/31/2016 10/30/2016 10/30/2016  HbA1c <5.7 % of total Hgb 6.1(H) 6.0(H) - - 6.0(H)  Chol <200 mg/dL 138 149 171 - -  HDL >50 mg/dL 46(L) 45(L) 45 - -  Calc LDL mg/dL (calc) 69 81 108(H) - -  Triglycerides <150 mg/dL 146 132 89 -  -  Creatinine 0.50 - 1.05 mg/dL 0.79 0.81 - 0.80 0.80   BP/Weight 08/06/2017 07/09/2017 06/25/2017 06/23/2017 05/19/2017 02/12/2017 00/86/7619  Systolic BP 509 326 - - - 712 -  Diastolic BP 78 63 - - - 80 -  Wt. (Lbs) 168 167 167 169.8 170 167.12 169  BMI 31.74 31.55 31.55 32.08 32.12 31.58 31.93   Deteriorated, start metformin 500 mg daily, return in 3 months with lab , HBA1C and chem 7 and EGFR, hopefully this will also assist with weight loss   Chronic fatigue disorder Reports worsening of her symptoms with a nadir around 1400 every day which makes it very difficult for her to defectively do her work  Currently on the maximum dose of adder all, will reach out to her neurologist for assistance, curbside before formal referral , Carmen Mcintyre agrees  Depression with anxiety No medication change indicated. Pt has great support from her community of Faith and does not feel that though having increased stress associated with upcoming stress of separation from her older child when Carmen Mcintyre laughingly said Carmen Mcintyre would need xanax, Carmen Mcintyre will benefit from telepsych, it may become a feasible option for her and I may need to remind her of that  Metabolic syndrome X The increased risk of cardiovascular disease associated with this diagnosis, and the need to consistently work on lifestyle to change this is discussed. Following  a  heart healthy diet ,commitment to 30 minutes of exercise at least 5 days per week, as well as control of blood sugar and cholesterol , and achieving a healthy weight are all the areas to be addressed .   Hyperlipidemia LDL goal <70 Hyperlipidemia:Low fat diet discussed and encouraged.   Lipid Panel  Lab Results  Component Value Date   CHOL 138 06/18/2017   HDL 46 (L) 06/18/2017   LDLCALC 69 06/18/2017   TRIG 146 06/18/2017   CHOLHDL 3.0 06/18/2017   At goal , no med change Needs to commit to more regular exercise

## 2017-08-10 ENCOUNTER — Other Ambulatory Visit: Payer: Self-pay | Admitting: Family Medicine

## 2017-08-10 ENCOUNTER — Encounter: Payer: Self-pay | Admitting: Family Medicine

## 2017-08-10 DIAGNOSIS — G4719 Other hypersomnia: Secondary | ICD-10-CM

## 2017-08-10 NOTE — Progress Notes (Signed)
amb neurology 

## 2017-08-11 ENCOUNTER — Ambulatory Visit: Payer: 59 | Admitting: Nutrition

## 2017-08-11 MED FILL — ADDERALL XR 30 MG CAP SA: 30 | 90 days supply | Qty: 90 | Fill #0

## 2017-09-21 ENCOUNTER — Encounter: Payer: Self-pay | Admitting: Family Medicine

## 2017-09-22 DIAGNOSIS — H52223 Regular astigmatism, bilateral: Secondary | ICD-10-CM | POA: Diagnosis not present

## 2017-09-24 ENCOUNTER — Telehealth: Payer: Self-pay

## 2017-09-24 DIAGNOSIS — M25521 Pain in right elbow: Secondary | ICD-10-CM

## 2017-09-24 NOTE — Telephone Encounter (Signed)
PT referral placed per Dr. Moshe Cipro

## 2017-09-25 MED FILL — VENLAFAXINE HCL ER 150 MG C: 150 | 90 days supply | Qty: 90 | Fill #0

## 2017-10-02 ENCOUNTER — Encounter: Payer: Self-pay | Admitting: Family Medicine

## 2017-10-02 ENCOUNTER — Telehealth: Payer: Self-pay | Admitting: Family Medicine

## 2017-10-02 ENCOUNTER — Telehealth: Payer: Self-pay

## 2017-10-02 DIAGNOSIS — M25521 Pain in right elbow: Secondary | ICD-10-CM

## 2017-10-02 NOTE — Telephone Encounter (Signed)
PT will call to schedule- they have called her already but couldn't reach her. Declined ortho referral at this time

## 2017-10-02 NOTE — Telephone Encounter (Signed)
Attempted to directl;y speak with [facility] as she still has not been able to get appointment with PT/OT that she had sent in a request for last week, which I thought had been completed.  I did sign and have a form faxed over 1 or 2 days ago. from the dept.  I will follow through ,and ask Brandi to help with this.  I also would like to know if she also wishes ortho eval of the wrist, I will refer if she does to ortho o f her choice or if none I recommend Dr Amedeo Plenty I am sending her a pt message also  Brandi please  Give me some help with this, let's discuss!

## 2017-10-02 NOTE — Telephone Encounter (Signed)
OT referral entered per Dr.Simpson for right elbow pain

## 2017-10-07 ENCOUNTER — Telehealth (HOSPITAL_COMMUNITY): Payer: Self-pay | Admitting: Family Medicine

## 2017-10-07 NOTE — Telephone Encounter (Signed)
10/07/17  pt called to say she couldn't come... she couldn't work it out with her job... Eval will be the next appt. 8/6

## 2017-10-07 NOTE — Telephone Encounter (Signed)
10/07/17  Pt left [place] a message cancelling the eval appt.  I called her back and left her a message to see if she knew about the other appts on her schedule.  We would change the eval appt to 8/6 but needing to confirm with her

## 2017-10-08 ENCOUNTER — Ambulatory Visit (HOSPITAL_COMMUNITY): Payer: 59 | Admitting: Specialist

## 2017-10-13 ENCOUNTER — Other Ambulatory Visit: Payer: Self-pay

## 2017-10-13 ENCOUNTER — Ambulatory Visit (HOSPITAL_COMMUNITY): Payer: 59 | Attending: Family Medicine

## 2017-10-13 ENCOUNTER — Encounter (HOSPITAL_COMMUNITY): Payer: Self-pay

## 2017-10-13 DIAGNOSIS — M25521 Pain in right elbow: Secondary | ICD-10-CM | POA: Insufficient documentation

## 2017-10-13 NOTE — Patient Instructions (Signed)
For the follow exercises: Complete a slow 2-3 second count up and when coming down. Complete 10 reps. 2-3 times a day.   WRIST EXTENSION CURLS - TABLE  Hold a small free weight, rest your forearm on a table and bend your wrist up and down with your palm face down as shown.     FREE WEIGHT RADIAL DEVIATION - TABLE  Hold a small free weight, rest your forearm on a table and bend your wrist up and down with your palm facing towards the side as shown.      FREE WEIGHT SUPINATION AND PRONATION  Rest your forearm on your knee or a table. Next, while holding the end of a small weight, slowly lower the weight towards the outside and then rotate your forearm towards the inside of your body as shown.       BALL SQUEEZE  With an elastic ball, firmly squeeze it in the palm of your hand.     ICE MASSAGE TO LATERAL EPICONDYLE - COMMON WRIST EXTENSOR TENDON - TENNIS ELBOW  Place direct ice from an ice massage cup to the lateral epicondyle of the elbow as shown (the wrist extensor tendon area). Move the ice in a circular motion for up to 5 minutes (no more). Use towels to catch the water drippings. This is commonly the area of inflammation as describe with a Tennis Elbow injury.   You should feel 4 stages of sensations starting with... 1. Uncomfortable sensation of cold, then 2. Stinging, then 3. Burning or aching feeling, then 4. Numbness  If the pain is too great to handle, lift it off your skin for a few seconds, dab with towel and then place it back on for a few circular motions and repeat.  *Do not perform for more than 5 minutes or you may run the risk of frost bite and cause death to the tissue. Use a timer to be safe

## 2017-10-13 NOTE — Therapy (Signed)
[place], [place], [address] Phone: [phone]   Fax:  [phone]  Occupational Therapy Evaluation  Patient Details  Name: Carmen Mcintyre MRN: [id number] Date of Birth: August 16, 1965 Referring Provider: Dr. Tula Nakayama   Encounter Date: 10/13/2017  OT End of Session - 10/13/17 1049    Visit Number  1    Number of Visits  1    Authorization Type  [facility] [employer] $25 co pay no visit limit    OT Start Time  0818    OT Stop Time  0900    OT Time Calculation (min)  42 min    Activity Tolerance  Patient tolerated treatment well    Behavior During Therapy  [facility] for tasks assessed/performed       Past Medical History:  Diagnosis Date  . Allergy   . Anxiety   . Depression   . Dyslipidemia   . Hyperlipidemia   . Hypertension   . Insomnia   . Stroke ([place])   . Urinary incontinence     Past Surgical History:  Procedure Laterality Date  . BREAST BIOPSY Left    benign  . CAESAREAN  1997/2003  . CHOLECYSTECTOMY  2005  . CHOLECYSTECTOMY    . LEFT BREAST MASS REMOVED  1998  . RT LOWER LOBECTOMY SECONDARY IN INFECTION  2006    There were no vitals filed for this visit.  Subjective Assessment - 10/13/17 0825    Subjective   S: It's been hurting since about February or March of this year.     Pertinent History  Patient is a 52 y/o female presenting to the clinic with reports of right elbow pain which began approximately in February of this year. Patient has not been evaluated by an MD although she is currently presenting with symptoms of lateral epicondylitis. Patient states she started a new job October of 2018 which involves all computer work. She has recently changed her mouse at her work station. She has been completing elbow stretches and using modalities at home to help with temporary pain relief.     Patient Stated Goals  To decrease the pain in her elbow.    Currently in Pain?  Yes    Pain Score  2  5/10  with use. Sharp pain. instant pain    Pain Location  Elbow    Pain Orientation  Right    Pain Descriptors / Indicators  Constant;Dull    Pain Type  Chronic pain    Pain Radiating Towards  N/A    Pain Onset  More than a month ago    Pain Frequency  Constant    Aggravating Factors   Using it, lifting any items, elbow extension during reaching, pushing a door open or pulling a door closed    Pain Relieving Factors  TENS unit, Ice, Stretches, pain patches provide temporary relief.    Effect of Pain on Daily Activities  Patient pushes through activities and pain        [facility] OT Assessment - 10/13/17 0825      Assessment   Medical Diagnosis  right elbow pain    Referring Provider  Dr. Tula Nakayama    Onset Date/Surgical Date  -- february/march 2019    Hand Dominance  Right    Next MD Visit  N/A    Prior Therapy  None      Precautions   Precautions  None      Restrictions  Weight Bearing Restrictions  No      Balance Screen   Has the patient fallen in the past 6 months  No      Home  Environment   Family/patient expects to be discharged to:  Private residence      Prior Function   Level of Independence  Independent    Vocation  Part time employment    Vocation Requirements  3 days a week. At the computer for the entire shift. 8 hours a day.       ADL   ADL comments  Pt reports pain with pushing/pull on doors, lifting llightweight items such as a coffee cup and when completing elbow extension when reaching out to the side.       Mobility   Mobility Status  Independent      Written Expression   Dominant Hand  Right      Vision - History   Baseline Vision  No visual deficits      Cognition   Overall Cognitive Status  Within Functional Limits for tasks assessed      ROM / Strength   AROM / PROM / Strength  AROM;PROM;Strength      Palpation   Palpation comment  Moderate fascial restrictions palpated in right lateral epicondyle.       AROM   Overall AROM    Within functional limits for tasks performed    Overall AROM Comments  right elbow and wrist      PROM   Overall PROM   Within functional limits for tasks performed    Overall PROM Comments  right elbow and wrist      Strength   Overall Strength Comments  assessed seated.     Strength Assessment Site  Elbow;Wrist;Forearm;Hand    Right/Left Elbow  Right    Right Elbow Flexion  5/5    Right Elbow Extension  5/5    Right/Left Forearm  Right    Right Forearm Pronation  5/5    Right Forearm Supination  5/5    Right/Left Wrist  Right    Right Wrist Flexion  5/5    Right Wrist Extension  5/5    Right Wrist Radial Deviation  5/5    Right Wrist Ulnar Deviation  5/5    Right/Left hand  Right;Left    Right Hand Grip (lbs)  40    Left Hand Grip (lbs)  65               OT Treatments/Exercises (OP) - 10/13/17 0001      Modalities   Modalities  Cryotherapy      Cryotherapy   Number Minutes Cryotherapy  2 Minutes    Cryotherapy Location  Forearm    Type of Cryotherapy  Ice massage            OT Education - 10/13/17 1045    Education Details  ice massage, the typical treatment for lateral epicondylitis and timeframe of recovery, wrist and forearm strengthening with 3# weight    Person(s) Educated  Patient    Methods  Explanation;Demonstration;Verbal cues;Handout    Comprehension  Returned demonstration;Verbalized understanding       OT Short Term Goals - 10/13/17 1057      OT SHORT TERM GOAL #1   Title  Patient will be educated and independent with HEP that will include strengthening, pain management, and task modification in order to faciliate healing and allow patient to return to prior level of function.  Time  1    Period  Days    Status  Achieved    Target Date  10/13/17               Plan - 10/13/17 1050    Clinical Impression Statement  A: Patient is a 52 y/o female S/P right elbow which began in February/March of this year. Symptoms do present  similar to lateral epicondylitis. Patient reports increase pain and fascial restrictions when completing daily tasks.     Occupational Profile and client history currently impacting functional performance  patient is motivated to return to prior level of function. Patient has been actively working on condition    Occupational performance deficits (Please refer to evaluation for details):  ADL's;Work    Rehab Potential  Excellent    Current Impairments/barriers affecting progress:  Patient does not have a diagnosis of issue.    OT Frequency  One time visit    OT Treatment/Interventions  Patient/family education    Plan  P: 1 time visit for HEP.    Clinical Decision Making  Limited treatment options, no task modification necessary    Consulted and Agree with Plan of Care  Patient       Patient will benefit from skilled therapeutic intervention in order to improve the following deficits and impairments:  Pain  Visit Diagnosis: Pain in right elbow - Plan: Ot plan of care cert/re-cert    Problem List Patient Active Problem List   Diagnosis Date Noted  . Stroke (cerebrum) ([place]) 10/31/2016  . HTN (hypertension) 10/31/2016  . Ingrown toenail 09/07/2016  . Metabolic syndrome X 56/38/7564  . Hot flashes 01/03/2013  . Chronic fatigue disorder 10/04/2012  . Depression with anxiety 04/07/2011  . Seasonal allergies 09/26/2010  . CHRONIC FATIGUE SYNDROME 03/24/2010  . Prediabetes 11/16/2009  . Obesity (BMI 30.0-34.9) 07/01/2008  . Acute cystitis with hematuria 01/06/2008  . Hyperlipidemia LDL goal <70 08/04/2007   Ailene Ravel, OTR/L,CBIS  [phone]  10/13/2017, 11:02 AM  [place], [place], [address] Phone: [phone]   Fax:  [phone]  Name: Carmen Mcintyre MRN: [id number] Date of Birth: Apr 09, 1965

## 2017-10-16 ENCOUNTER — Encounter (HOSPITAL_COMMUNITY): Payer: 59 | Admitting: Occupational Therapy

## 2017-10-20 ENCOUNTER — Encounter (HOSPITAL_COMMUNITY): Payer: 59 | Admitting: Occupational Therapy

## 2017-10-20 MED FILL — ATORVASTATIN CALCIUM 20 MG: 20 | 60 days supply | Qty: 60 | Fill #6

## 2017-10-23 ENCOUNTER — Encounter (HOSPITAL_COMMUNITY): Payer: 59

## 2017-10-27 ENCOUNTER — Encounter (HOSPITAL_COMMUNITY): Payer: 59

## 2017-10-27 ENCOUNTER — Ambulatory Visit: Payer: 59 | Admitting: Family Medicine

## 2017-10-30 ENCOUNTER — Encounter (HOSPITAL_COMMUNITY): Payer: 59 | Admitting: Occupational Therapy

## 2017-11-08 ENCOUNTER — Encounter: Payer: Self-pay | Admitting: Family Medicine

## 2017-11-08 DIAGNOSIS — I10 Essential (primary) hypertension: Secondary | ICD-10-CM | POA: Diagnosis not present

## 2017-11-08 DIAGNOSIS — R110 Nausea: Secondary | ICD-10-CM | POA: Diagnosis not present

## 2017-11-08 DIAGNOSIS — Z8673 Personal history of transient ischemic attack (TIA), and cerebral infarction without residual deficits: Secondary | ICD-10-CM | POA: Diagnosis not present

## 2017-11-08 DIAGNOSIS — Z79899 Other long term (current) drug therapy: Secondary | ICD-10-CM | POA: Diagnosis not present

## 2017-11-08 DIAGNOSIS — Z760 Encounter for issue of repeat prescription: Secondary | ICD-10-CM | POA: Diagnosis not present

## 2017-11-10 ENCOUNTER — Encounter: Payer: Self-pay | Admitting: Family Medicine

## 2017-11-11 ENCOUNTER — Encounter: Payer: Self-pay | Admitting: Family Medicine

## 2017-11-17 ENCOUNTER — Ambulatory Visit (INDEPENDENT_AMBULATORY_CARE_PROVIDER_SITE_OTHER): Payer: 59 | Admitting: Sports Medicine

## 2017-11-17 ENCOUNTER — Encounter: Payer: Self-pay | Admitting: Sports Medicine

## 2017-11-17 VITALS — BP 130/84 | HR 100 | Ht 61.0 in | Wt 171.4 lb

## 2017-11-17 DIAGNOSIS — M7711 Lateral epicondylitis, right elbow: Secondary | ICD-10-CM | POA: Diagnosis not present

## 2017-11-17 DIAGNOSIS — M9907 Segmental and somatic dysfunction of upper extremity: Secondary | ICD-10-CM

## 2017-11-17 DIAGNOSIS — G5631 Lesion of radial nerve, right upper limb: Secondary | ICD-10-CM

## 2017-11-17 DIAGNOSIS — M25521 Pain in right elbow: Secondary | ICD-10-CM

## 2017-11-17 MED ORDER — NITROGLYCERIN 0.2 MG/HR TD PT24: MEDICATED_PATCH | TRANSDERMAL | 1 refills | Status: DC

## 2017-11-17 NOTE — Progress Notes (Signed)
Carmen Mcintyre. Carmen Mcintyre, [place] [facility] [place]  Carmen Mcintyre - 52 y.o. female MRN [id number]  Date of birth: 04-Jul-1965  Visit Date: 11/17/2017  PCP: Fayrene Helper, MD   Referred by: Fayrene Helper, MD  Scribe(s) for today's visit: Josepha Pigg, CMA  SUBJECTIVE:  Carmen Mcintyre is here for Initial Assessment (R arm pain)  HPI: Her R arm pain symptoms INITIALLY: Began 5-6 months. She recently started using a mouse and sx started about 1 month after that. Described as mild burning, radiating to the upper arm slightly.  Worsened with motions, like scooping - causes sharp stabbing pain. She also reports stabbing pain with pushing and lifting.  Improved with ice 2-3 x daily.  Additional associated symptoms include: She denies weakness in the arm and decreased grip strength. She reports increased warmth, erythema, and swelling   At this time symptoms are worsening compared to onset. She has been exercises provided by PT. She has been doing the exercises almost daily. Sx seem to worsen when doing exercises with weight. She has tried Advil and Aleve with some relief.   REVIEW OF SYSTEMS: Denies night time disturbances. Denies fevers, chills, or night sweats. Denies unexplained weight loss. Denies personal history of cancer. Denies changes in bowel or bladder habits. Denies recent unreported falls. Denies new or worsening dyspnea or wheezing. Denies headaches or dizziness.  Denies numbness, tingling or weakness  In the extremities.  Denies dizziness or presyncopal episodes Denies lower extremity edema    HISTORY:  Prior history reviewed and updated per electronic medical record.  Social History   Occupational History  . Occupation: Employed   Tobacco Use  . Smoking status: Never Smoker  . Smokeless tobacco: Never Used  Substance and Sexual Activity  . Alcohol use: Yes    Comment: occ. wine  . Drug use: No    . Sexual activity: Yes   Social History   Social History Narrative  . Not on file    Past Medical History:  Diagnosis Date  . Allergy   . Anxiety   . Depression   . Dyslipidemia   . Hyperlipidemia   . Hypertension   . Insomnia   . Stroke ([place])   . Urinary incontinence    Past Surgical History:  Procedure Laterality Date  . BREAST BIOPSY Left    benign  . CAESAREAN  1997/2003  . CHOLECYSTECTOMY  2005  . CHOLECYSTECTOMY    . LEFT BREAST MASS REMOVED  1998  . RT LOWER LOBECTOMY SECONDARY IN INFECTION  2006   family history includes Arthritis in her unknown relative; Diabetes in her mother. There is no history of Colon cancer, Esophageal cancer, Liver cancer, Pancreatic cancer, Rectal cancer, or Stomach cancer.  DATA OBTAINED & REVIEWED:   Recent Labs    02/10/17 0832 06/18/17 0909  HGBA1C 6.0* 6.1*   .   OBJECTIVE:  VS:  HT:5\' 1"  (154.9 cm)   WT:171 lb 6.4 oz (77.7 kg)  BMI:32.4    BP:130/84  HR:100bpm  TEMP: ( )  RESP:98 %   PHYSICAL EXAM: CONSTITUTIONAL: Well-developed, Well-nourished and In no acute distress Psychiatric: Alert & appropriately interactive. and Not depressed or anxious appearing. RESPIRATORY: No increased work of breathing and Trachea Midline EYES: Pupils are equal., EOM intact without nystagmus. and No scleral icterus.  Upper extremities: Warm and well perfused NEURO: unremarkable  MSK Exam: Right elbow: . Well aligned, no significant deformity. Marland Kitchen TTP  over Lateral epicondyle and extensor musculature.  Moderate pain with palpation over the arcade of Frohse's with slight radicular component. . Non tender over Medial epicondyle, distal radius or DRUJ. . Strength is intact and the right upper extremity with elbow flexion, extension and wrist flexion extension but there is pain with resisted wrist extension. . Ligamentously stable to Varus and valgus straining . Painful wrist extension with marked pain with resisted third finger  extension.   ASSESSMENT   1. Right elbow pain   2. Lateral epicondylitis of right elbow   3. Radial nerve compression, right   4. Somatic dysfunction of upper extremity     PLAN & PROCEDURES:  . Pertinent additional documentation may be included in corresponding procedure notes, imaging studies, problem based documentation and patient instructions. . Please see AVS (Patient Instructions) for further patient specific directions . Osteopathic manipulation was performed today based on physical exam findings.  Please see procedure note for further information including Osteopathic Exam findings . avoid ICE directly over the mid portion of the arm . Continue your previously prescribed home exercise program . Rest the injured area as much as practical . Apply heat (do not to sleep on heating pad) . If any lack of improvement consider further diagnostic evaluation with MSK [place], repeat Osteopathic Manipulation or Biologic Therapy with PRP . Capsaicin cream for nerve component recommended . Discussed options with the patient today including biologic treatment with topical nitroglycerin. Patient has no contraindications & understands the risks, benefits and intentions of treatment. Emphasized the importance of rotating sites as well as appropriate and expected adverse reactions including orthostasis, headache, adhesive sensitivity.  . Begin with 1/4 patch to the affected area. Okay to titrate to half a patch as tolerated.  Follow-up: Return in about 6 weeks (around 12/29/2017).      CMA/ATC served as Education administrator during this visit. History, Physical, and Plan performed by medical provider.  Documentation and orders reviewed and attested to.      Gerda Diss, [facility] Physician

## 2017-11-17 NOTE — Patient Instructions (Addendum)
Look into getting capsacin cream at your local pharmacy   Nitroglycerin Protocol   Apply 1/4 nitroglycerin patch to affected area daily.  Change position of patch within the affected area every 24 hours.  You may experience a headache during the first 1-2 weeks of using the patch, these should subside.  If you experience headaches after beginning nitroglycerin patch treatment, you may take your preferred over the counter pain reliever.  Another side effect of the nitroglycerin patch is skin irritation or rash related to patch adhesive.  Please notify our office if you develop more severe headaches or rash, and stop the patch.  Tendon healing with nitroglycerin patch may require 12 to 24 weeks depending on the extent of injury.  Men should not use if taking Viagra, Cialis, or Levitra.   Do not use if you have migraines or rosacea.

## 2017-11-18 MED FILL — NITROGLYCERIN 0.2 MG/HR PTC: 0.2 | 60 days supply | Qty: 30 | Fill #0

## 2017-11-19 ENCOUNTER — Encounter: Payer: Self-pay | Admitting: Sports Medicine

## 2017-11-19 DIAGNOSIS — L719 Rosacea, unspecified: Secondary | ICD-10-CM | POA: Diagnosis not present

## 2017-11-19 DIAGNOSIS — L821 Other seborrheic keratosis: Secondary | ICD-10-CM | POA: Diagnosis not present

## 2017-11-19 DIAGNOSIS — L570 Actinic keratosis: Secondary | ICD-10-CM | POA: Diagnosis not present

## 2017-11-19 NOTE — Progress Notes (Signed)
PROCEDURE NOTE : OSTEOPATHIC MANIPULATION The decision today to treat with Osteopathic Manipulative Therapy (OMT) was based on physical exam findings. Verbal consent was obtained following a discussion with the patient regarding the of risks, benefits and potential side effects, including an acute pain flare,post manipulation soreness and need for repeat treatments.     Contraindications to OMT: NONE  Manipulation was performed as below: Regions treated: Upper extremities OMT Techniques Used: HVLA, muscle energy, myofascial release and soft tissue  The patient tolerated the treatment well and reported Improved symptoms following treatment today. Patient was given medications, exercises, stretches and lifestyle modifications per AVS and verbally.   OSTEOPATHIC/STRUCTURAL EXAM:   Anterior radial head, interosseous membrane torsion

## 2017-11-24 ENCOUNTER — Ambulatory Visit: Payer: 59 | Admitting: Family Medicine

## 2017-11-24 ENCOUNTER — Ambulatory Visit (INDEPENDENT_AMBULATORY_CARE_PROVIDER_SITE_OTHER): Payer: 59 | Admitting: Family Medicine

## 2017-11-24 ENCOUNTER — Encounter: Payer: Self-pay | Admitting: Family Medicine

## 2017-11-24 ENCOUNTER — Other Ambulatory Visit: Payer: Self-pay | Admitting: Family Medicine

## 2017-11-24 VITALS — BP 122/78 | HR 71 | Resp 16 | Ht 61.0 in | Wt 165.0 lb

## 2017-11-24 DIAGNOSIS — Z1231 Encounter for screening mammogram for malignant neoplasm of breast: Secondary | ICD-10-CM

## 2017-11-24 DIAGNOSIS — E669 Obesity, unspecified: Secondary | ICD-10-CM

## 2017-11-24 DIAGNOSIS — R5382 Chronic fatigue, unspecified: Secondary | ICD-10-CM | POA: Diagnosis not present

## 2017-11-24 DIAGNOSIS — E785 Hyperlipidemia, unspecified: Secondary | ICD-10-CM

## 2017-11-24 DIAGNOSIS — R7303 Prediabetes: Secondary | ICD-10-CM

## 2017-11-24 DIAGNOSIS — F418 Other specified anxiety disorders: Secondary | ICD-10-CM | POA: Diagnosis not present

## 2017-11-24 DIAGNOSIS — G9332 Myalgic encephalomyelitis/chronic fatigue syndrome: Secondary | ICD-10-CM

## 2017-11-24 DIAGNOSIS — I10 Essential (primary) hypertension: Secondary | ICD-10-CM

## 2017-11-24 MED ORDER — LISINOPRIL 10 MG PO TABS: 10.0000 mg | ORAL_TABLET | Freq: Every day | ORAL | 1 refills | Status: DC

## 2017-11-24 NOTE — Progress Notes (Signed)
Fill Date ID Written Drug Qty Days Prescriber Rx # Pharmacy Refill Daily Dose * Pymt Type PMP  08/11/2017  1  08/06/2017  Adderall Xr 30 Mg Capsule  90.00 90 Ma Sim  [id number]  [place]es (0126)  0/0  Comm Ins  [place]  05/15/2017  1  05/15/2017  Adderall Xr 30 Mg Capsule  90.00 90 Ma Sim  [id number]  [place]os (0906)  0/0  Comm Ins  [place]  02/12/2017  1  02/12/2017  Adderall Xr 30 Mg Capsule  90.00 90 Ma Sim  [id number]  Mos (0906)  0/0  Comm Ins  [place]  12/31/2016  1  10/23/2016  Adderall Xr 30 Mg Capsule  30.00 30 Ma Sim  [id number]  Mos (0906)  0/0  Comm Ins  [place]  12/03/2016  1  10/23/2016  Adderall Xr 30 Mg Capsule  30.00 30 Ma Sim  [id number]  Mos (0906)  0/0  Comm Ins   [place]  10/27/2016  1  10/23/2016  Adderall Xr 30 Mg Capsule  30.00 30 Ma Sim  [id number]  Mos (0906)  0/0  Comm Ins  [place]  09/15/2016  1  09/11/2016  Dextroamp-Amphetamin 20 Mg Tab  90.00 90 Ma Sim  [id number]  Mos (0906)  0/0  Comm Ins  [place]  06/16/2016  1  04/15/2016  Dextroamp-Amphetamin 20 Mg Tab  90.00 90 Ma Sim  [id number]  Mos (0906)  0/0  Comm Ins  [place]  04/15/2016  1  04/15/2016  Phentermine 37.5 Mg Tablet

## 2017-11-24 NOTE — Patient Instructions (Addendum)
F/U  end January, call if you need me before   Shingrix indicated and we have it  Keto diet for 1 month only, increase exercise commitment  Fasting lipid, cmp and EGFr, and HBA1C Around Oct 25  Thank you  for choosing [facility]. We consider it a privelige to serve you.  Delivering excellent health care in a caring and  compassionate way is our goal.  Partnering with you,  so that together we can achieve this goal is our strategy.

## 2017-11-25 ENCOUNTER — Encounter: Payer: Self-pay | Admitting: Family Medicine

## 2017-11-25 MED ORDER — VENLAFAXINE HCL ER 150 MG PO CP24: 150.0000 mg | ORAL_CAPSULE | Freq: Every day | ORAL | 3 refills | Status: DC

## 2017-11-25 MED ORDER — AMPHETAMINE-DEXTROAMPHET ER 30 MG PO CP24: 30.0000 mg | ORAL_CAPSULE | ORAL | 0 refills | Status: DC

## 2017-11-25 NOTE — Assessment & Plan Note (Signed)
Updated lab needed in 1 month Hyperlipidemia:Low fat diet discussed and encouraged.   Lipid Panel  Lab Results  Component Value Date   CHOL 138 06/18/2017   HDL 46 (L) 06/18/2017   LDLCALC 69 06/18/2017   TRIG 146 06/18/2017   CHOLHDL 3.0 06/18/2017     .

## 2017-11-25 NOTE — Assessment & Plan Note (Signed)
Improved , and controlled on current medication which is also being used for hot flashes, continue same

## 2017-11-25 NOTE — Assessment & Plan Note (Signed)
Patient educated about the importance of limiting  Carbohydrate intake , the need to commit to daily physical activity for a minimum of 30 minutes , and to commit weight loss. The fact that changes in all these areas will reduce or eliminate all together the development of diabetes is stressed.  Updated lab needed in 1 month.   Diabetic Labs Latest Ref Rng & Units 06/18/2017 02/10/2017 10/31/2016 10/30/2016 10/30/2016  HbA1c <5.7 % of total Hgb 6.1(H) 6.0(H) - - 6.0(H)  Chol <200 mg/dL 138 149 171 - -  HDL >50 mg/dL 46(L) 45(L) 45 - -  Calc LDL mg/dL (calc) 69 81 108(H) - -  Triglycerides <150 mg/dL 146 132 89 - -  Creatinine 0.50 - 1.05 mg/dL 0.79 0.81 - 0.80 0.80   BP/Weight 11/24/2017 11/17/2017 08/06/2017 07/09/2017 06/25/2017 06/23/2017 0/81/4481  Systolic BP 856 314 970 263 - - -  Diastolic BP 78 84 78 63 - - -  Wt. (Lbs) 165 171.4 168 167 167 169.8 170  BMI 31.18 32.39 31.74 31.55 31.55 32.08 32.12   No flowsheet data found.

## 2017-11-25 NOTE — Assessment & Plan Note (Signed)
Controlled, no change in medication DASH diet and commitment to daily physical activity for a minimum of 30 minutes discussed and encouraged, as a part of hypertension management. The importance of attaining a healthy weight is also discussed.  BP/Weight 11/24/2017 11/17/2017 08/06/2017 07/09/2017 06/25/2017 06/23/2017 7/34/0370  Systolic BP 964 383 818 403 - - -  Diastolic BP 78 84 78 63 - - -  Wt. (Lbs) 165 171.4 168 167 167 169.8 170  BMI 31.18 32.39 31.74 31.55 31.55 32.08 32.12

## 2017-11-25 NOTE — Progress Notes (Signed)
Carmen Mcintyre     MRN: [id number]      DOB: [birth year]   HPI Carmen Mcintyre is here for follow up and re-evaluation of chronic medical conditions, medication management   Preventive health is updated, specifically  Cancer screening and Immunization.   Has upcoming appt with neurology, and we review the need for repeating her MRI and also she will discuss her excessive afternoon sleepiness at that visit,.she states it is less severe than in the past The PT denies any adverse reactions to current medications since the last visit.  She has started regular exercise and is changing her diet , and says that she feels better. Wants a 1 month tria;l of the keto diet , to jump start weight loss, she has been reading and is going to choose healthy fats, I have given the OK and will repeat her lipids in the next 1 month and other labs ROS Denies recent fever or chills. Denies sinus pressure, nasal congestion, ear pain or sore throat. Denies chest congestion, productive cough or wheezing. Denies chest pains, palpitations and leg swelling Denies abdominal pain, nausea, vomiting,diarrhea or constipation.   Denies dysuria, frequency, hesitancy or incontinence. Denies joint pain, swelling and limitation in mobility. Denies headaches, seizures, numbness, or tingling. Denies depression, anxiety or insomnia. Denies skin break down or rash.   PE  BP 122/78   Pulse 71   Resp 16   Ht 5\' 1"  (1.549 m)   Wt 165 lb (74.8 kg)   LMP 08/22/2016   SpO2 100%   BMI 31.18 kg/m   Patient alert and oriented and in no cardiopulmonary distress.  HEENT: No facial asymmetry, EOMI,   oropharynx pink and moist.  Neck supple no JVD, no mass.  Chest: Clear to auscultation bilaterally.  CVS: S1, S2 no murmurs, no S3.Regular rate.  ABD: Soft non tender.   Ext: No edema  MS: Adequate ROM spine, shoulders, hips and knees.  Skin: Intact, no ulcerations or rash noted.  Psych: Good eye contact, normal affect. Memory  intact not anxious or depressed appearing.  CNS: CN 2-12 intact, power,  normal throughout.no focal deficits noted.   Assessment & Plan  HTN (hypertension) Controlled, no change in medication DASH diet and commitment to daily physical activity for a minimum of 30 minutes discussed and encouraged, as a part of hypertension management. The importance of attaining a healthy weight is also discussed.  BP/Weight 11/24/2017 11/17/2017 08/06/2017 07/09/2017 06/25/2017 06/23/2017 6/64/4034  Systolic BP 742 595 638 756 - - -  Diastolic BP 78 84 78 63 - - -  Wt. (Lbs) 165 171.4 168 167 167 169.8 170  BMI 31.18 32.39 31.74 31.55 31.55 32.08 32.12       Hyperlipidemia LDL goal <70 Updated lab needed in 1 month Hyperlipidemia:Low fat diet discussed and encouraged.   Lipid Panel  Lab Results  Component Value Date   CHOL 138 06/18/2017   HDL 46 (L) 06/18/2017   LDLCALC 69 06/18/2017   TRIG 146 06/18/2017   CHOLHDL 3.0 06/18/2017     .   Obesity (BMI 30.0-34.9) Improved, she is congratulated on this and encouraged to continue same. Patient re-educated about  the importance of commitment to a  minimum of 150 minutes of exercise per week.  The importance of healthy food choices with portion control discussed. Encouraged to start a food diary, count calories and to consider  joining a support group. Sample diet sheets offered. Goals set by the patient for the  next several months.   Weight /BMI 11/24/2017 11/17/2017 08/06/2017  WEIGHT 165 lb 171 lb 6.4 oz 168 lb  HEIGHT 5\' 1"  5\' 1"  5\' 1"   BMI 31.18 kg/m2 32.39 kg/m2 31.74 kg/m2      Depression with anxiety Improved , and controlled on current medication which is also being used for hot flashes, continue same  Prediabetes Patient educated about the importance of limiting  Carbohydrate intake , the need to commit to daily physical activity for a minimum of 30 minutes , and to commit weight loss. The fact that changes in all these areas  will reduce or eliminate all together the development of diabetes is stressed.  Updated lab needed in 1 month.   Diabetic Labs Latest Ref Rng & Units 06/18/2017 02/10/2017 10/31/2016 10/30/2016 10/30/2016  HbA1c <5.7 % of total Hgb 6.1(H) 6.0(H) - - 6.0(H)  Chol <200 mg/dL 138 149 171 - -  HDL >50 mg/dL 46(L) 45(L) 45 - -  Calc LDL mg/dL (calc) 69 81 108(H) - -  Triglycerides <150 mg/dL 146 132 89 - -  Creatinine 0.50 - 1.05 mg/dL 0.79 0.81 - 0.80 0.80   BP/Weight 11/24/2017 11/17/2017 08/06/2017 07/09/2017 06/25/2017 06/23/2017 0/60/0459  Systolic BP 977 414 239 532 - - -  Diastolic BP 78 84 78 63 - - -  Wt. (Lbs) 165 171.4 168 167 167 169.8 170  BMI 31.18 32.39 31.74 31.55 31.55 32.08 32.12   No flowsheet data found.    Chronic fatigue disorder 6 months of adderral prescribed at current dose, will note neurology input

## 2017-11-25 NOTE — Assessment & Plan Note (Signed)
Improved, she is congratulated on this and encouraged to continue same. Patient re-educated about  the importance of commitment to a  minimum of 150 minutes of exercise per week.  The importance of healthy food choices with portion control discussed. Encouraged to start a food diary, count calories and to consider  joining a support group. Sample diet sheets offered. Goals set by the patient for the next several months.   Weight /BMI 11/24/2017 11/17/2017 08/06/2017  WEIGHT 165 lb 171 lb 6.4 oz 168 lb  HEIGHT 5\' 1"  5\' 1"  5\' 1"   BMI 31.18 kg/m2 32.39 kg/m2 31.74 kg/m2

## 2017-11-25 NOTE — Assessment & Plan Note (Signed)
6 months of adderral prescribed at current dose, will note neurology input

## 2017-11-26 ENCOUNTER — Other Ambulatory Visit: Payer: Self-pay | Admitting: Family Medicine

## 2017-11-26 ENCOUNTER — Telehealth: Payer: Self-pay | Admitting: Family Medicine

## 2017-11-26 MED ORDER — ONDANSETRON HCL 4 MG PO TABS: 4.0000 mg | ORAL_TABLET | Freq: Three times a day (TID) | ORAL | 0 refills | Status: DC | PRN

## 2017-11-26 NOTE — Telephone Encounter (Signed)
I spoke to [facility] and medication is sent to [facility] [address] , per her request

## 2017-11-26 NOTE — Telephone Encounter (Signed)
Patient called requesting to speak to you while you were in clinic. She states since she seen Dr.Simpson last she has developed a sickness with headaches, lowgrade fever, nausea.  She has not been able to eat or drink much due to the nausea & she is just requesting zofran to help with that. [facility] is her pharmacy. Cb# [phone]

## 2017-12-08 MED FILL — VENLAFAXINE HCL ER 150 MG C: 150 | 90 days supply | Qty: 90 | Fill #0

## 2017-12-08 MED FILL — LISINOPRIL 10 MG TABLET: 10 | 90 days supply | Qty: 90 | Fill #0

## 2017-12-08 MED FILL — ADDERALL XR 30 MG CAP SA: 30 | 90 days supply | Qty: 90 | Fill #0

## 2017-12-08 MED FILL — metroNIDAZOLE 0.75 % CREA: 0.75 | 30 days supply | Qty: 45 | Fill #0

## 2017-12-15 ENCOUNTER — Other Ambulatory Visit: Payer: Self-pay | Admitting: Neurology

## 2017-12-15 DIAGNOSIS — R531 Weakness: Secondary | ICD-10-CM

## 2017-12-15 DIAGNOSIS — G35 Multiple sclerosis: Secondary | ICD-10-CM

## 2017-12-15 DIAGNOSIS — G5601 Carpal tunnel syndrome, right upper limb: Secondary | ICD-10-CM | POA: Diagnosis not present

## 2017-12-15 DIAGNOSIS — E782 Mixed hyperlipidemia: Secondary | ICD-10-CM | POA: Diagnosis not present

## 2017-12-15 DIAGNOSIS — G450 Vertebro-basilar artery syndrome: Secondary | ICD-10-CM | POA: Diagnosis not present

## 2017-12-15 DIAGNOSIS — R5382 Chronic fatigue, unspecified: Secondary | ICD-10-CM | POA: Diagnosis not present

## 2017-12-25 ENCOUNTER — Ambulatory Visit (HOSPITAL_COMMUNITY)
Admission: RE | Admit: 2017-12-25 | Discharge: 2017-12-25 | Disposition: A | Discharge status: Home / Self Care | Payer: 59 | Source: Ambulatory Visit | Attending: Neurology | Admitting: Neurology

## 2017-12-25 ENCOUNTER — Encounter (HOSPITAL_COMMUNITY): Payer: Self-pay

## 2017-12-25 ENCOUNTER — Ambulatory Visit
Admission: RE | Admit: 2017-12-25 | Discharge: 2017-12-25 | Disposition: A | Discharge status: Home / Self Care | Payer: 59 | Source: Ambulatory Visit | Attending: Family Medicine | Admitting: Family Medicine

## 2017-12-25 DIAGNOSIS — G35 Multiple sclerosis: Secondary | ICD-10-CM | POA: Insufficient documentation

## 2017-12-25 DIAGNOSIS — G8192 Hemiplegia, unspecified affecting left dominant side: Secondary | ICD-10-CM | POA: Diagnosis present

## 2017-12-25 DIAGNOSIS — R9082 White matter disease, unspecified: Secondary | ICD-10-CM | POA: Insufficient documentation

## 2017-12-25 DIAGNOSIS — Z1231 Encounter for screening mammogram for malignant neoplasm of breast: Secondary | ICD-10-CM

## 2017-12-25 DIAGNOSIS — R531 Weakness: Secondary | ICD-10-CM

## 2017-12-25 DIAGNOSIS — I69352 Hemiplegia and hemiparesis following cerebral infarction affecting left dominant side: Secondary | ICD-10-CM | POA: Insufficient documentation

## 2017-12-25 LAB — POCT I-STAT CREATININE: Creatinine, Ser: 0.7 mg/dL (ref 0.44–1.00)

## 2017-12-25 MED ORDER — GADOBUTROL 1 MMOL/ML IV SOLN
7.5000 mL | Freq: Once | INTRAVENOUS | Status: AC | PRN
  Administered 2017-12-25: 7.5 mL via INTRAVENOUS

## 2017-12-29 ENCOUNTER — Ambulatory Visit (INDEPENDENT_AMBULATORY_CARE_PROVIDER_SITE_OTHER): Payer: 59 | Admitting: Sports Medicine

## 2017-12-29 ENCOUNTER — Encounter: Payer: Self-pay | Admitting: Sports Medicine

## 2017-12-29 VITALS — BP 124/80 | HR 89 | Ht 61.0 in | Wt 168.8 lb

## 2017-12-29 DIAGNOSIS — M25521 Pain in right elbow: Secondary | ICD-10-CM | POA: Diagnosis not present

## 2017-12-29 DIAGNOSIS — G5631 Lesion of radial nerve, right upper limb: Secondary | ICD-10-CM | POA: Diagnosis not present

## 2017-12-29 DIAGNOSIS — M7711 Lateral epicondylitis, right elbow: Secondary | ICD-10-CM

## 2017-12-29 MED ORDER — DICLOFENAC SODIUM 2 % TD SOLN: 1.0000 "application " | Freq: Two times a day (BID) | TRANSDERMAL | 2 refills | Status: DC

## 2017-12-29 MED ORDER — DICLOFENAC SODIUM 2 % TD SOLN: 1.0000 "application " | Freq: Two times a day (BID) | TRANSDERMAL | 0 refills | Status: AC

## 2017-12-29 NOTE — Progress Notes (Signed)
Juanda Bond. Rigby, [place] at [facility] [phone]  Carmen Mcintyre - 52 y.o. female MRN [id number]  Date of birth: [birth year]  Visit Date: 12/29/2017  PCP: Fayrene Helper, MD   Referred by: Fayrene Helper, MD  Scribe(s) for today's visit: Wendy Poet, LAT, ATC  SUBJECTIVE:  Carmen Mcintyre is here for Follow-up (R elbow pain)  HPI: Her R arm pain symptoms INITIALLY: Began 5-6 months. She recently started using a mouse and sx started about 1 month after that. Described as mild burning, radiating to the upper arm slightly.  Worsened with motions, like scooping - causes sharp stabbing pain. She also reports stabbing pain with pushing and lifting.  Improved with ice 2-3 x daily.  Additional associated symptoms include: She denies weakness in the arm and decreased grip strength. She reports increased warmth, erythema, and swelling   At this time symptoms are worsening compared to onset. She has been exercises provided by PT. She has been doing the exercises almost daily. Sx seem to worsen when doing exercises with weight. She has tried Advil and Aleve with some relief.   12/29/2017: Compared to the last office visit on 11/17/2017, her previously described R elbow symptoms are improving especially the more proximal muscle portion but notes that the "nerve portion" below the elbow is still irritated.  Pt states she hasn't used the nitro patches in about a week and also reports that she was only applying the nitro patches proximal to the elbow. Current symptoms are mild & are nonradiating She has been taking Advil prn.  She is no longer using the nitroglycerin patches x 1 week.  She also reports having tried the capsacin cream but notes that it made her skin "burn" so has stopped using that as well.  REVIEW OF SYSTEMS: Denies night time disturbances. Denies fevers, chills, or night sweats. Denies unexplained weight  loss. Denies personal history of cancer. Denies changes in bowel or bladder habits. Denies recent unreported falls. Denies new or worsening dyspnea or wheezing. Denies headaches or dizziness.  Denies numbness, tingling or weakness  In the extremities.  Denies dizziness or presyncopal episodes Denies lower extremity edema    HISTORY:  Prior history reviewed and updated per electronic medical record.  Social History   Occupational History  . Occupation: Employed   Tobacco Use  . Smoking status: Never Smoker  . Smokeless tobacco: Never Used  Substance and Sexual Activity  . Alcohol use: Yes    Comment: occ. wine  . Drug use: No  . Sexual activity: Yes   Social History   Social History Narrative  . Not on file    Past Medical History:  Diagnosis Date  . Allergy   . Anxiety   . Depression   . Dyslipidemia   . Hyperlipidemia   . Hypertension   . Insomnia   . Stroke ([place])   . Urinary incontinence    Past Surgical History:  Procedure Laterality Date  . BREAST BIOPSY Left    benign  . CAESAREAN  1997/2003  . CHOLECYSTECTOMY  2005  . CHOLECYSTECTOMY    . LEFT BREAST MASS REMOVED  1998  . RT LOWER LOBECTOMY SECONDARY IN INFECTION  2006   family history includes Arthritis in her unknown relative; Breast cancer in her maternal aunt; Diabetes in her mother. There is no history of Colon cancer, Esophageal cancer, Liver cancer, Pancreatic cancer, Rectal cancer, or Stomach cancer.  DATA OBTAINED &  REVIEWED:   Recent Labs    02/10/17 0832 06/18/17 0909  HGBA1C 6.0* 6.1*   .   OBJECTIVE:  VS:  HT:5\' 1"  (154.9 cm)   WT:168 lb 12.8 oz (76.6 kg)  BMI:31.91    BP:124/80  HR:89bpm  TEMP: ( )  RESP:97 %   PHYSICAL EXAM: CONSTITUTIONAL: Well-developed, Well-nourished and In no acute distress Psychiatric: Alert & appropriately interactive. and Not depressed or anxious appearing. RESPIRATORY: No increased work of breathing and Trachea Midline EYES: Pupils are  equal., EOM intact without nystagmus. and No scleral icterus.  Upper extremities: EXTREMITY EXAM: Warm and well perfused NEURO: unremarkable  MSK Exam: Right elbow: . Well aligned, no significant deformity. . TTP over Lateral epicondyle and extensor musculature.  Moderate pain with palpation over the arcade of Frohse's with slight radicular component. . Non tender over Medial epicondyle, distal radius or DRUJ. . Strength is intact and the right upper extremity with elbow flexion, extension and wrist flexion extension but there is pain with mild pain with resisted wrist extension. . Ligamentously stable to Varus and valgus straining  . Painful wrist extension with marked pain with resisted third finger extension.   ASSESSMENT   1. Right elbow pain   2. Lateral epicondylitis of right elbow   3. Radial nerve compression, right     PLAN & PROCEDURES:  . Osteopathic manipulation was performed today based on physical exam findings.  Please see procedure note for further information including Osteopathic Exam findings . Tendinopathic and acute muscle component.  We will treat the Tendinopathic with nitroglycerin protocol once again this was emphasized to continue to follow-up with her in 6 weeks. . For muscle component/nerve component.  Capsaicin is not tolerated.  Only short duration but could consider gabapentin if persistent symptoms. . Continue previously prescribed home exercise program.  . Discussed red flag symptoms that warrant earlier emergent evaluation and patient voices understanding. . Activity modifications and the importance of avoiding exacerbating activities (limiting pain to no more than a 4 / 10 during or following activity) recommended and discussed. Marland Kitchen RICE (Rest, ICE, Compression, Elevation) principles reviewed with the patient. . Capsaicin cream for nerve component recommended . Discussed options with the patient today including biologic treatment with topical  nitroglycerin. Patient has no contraindications & understands the risks, benefits and intentions of treatment. Emphasized the importance of rotating sites as well as appropriate and expected adverse reactions including orthostasis, headache, adhesive sensitivity.  . Begin with 1/4 patch to the affected area. Okay to titrate to half a patch as tolerated.  Follow-up: Return in about 6 weeks (around 02/09/2018).      CMA/ATC served as Education administrator during this visit. History, Physical, and Plan performed by medical provider.  Documentation and orders reviewed and attested to.      Gerda Diss, [place] Sports Medicine Physician

## 2017-12-29 NOTE — Progress Notes (Signed)
PROCEDURE NOTE : OSTEOPATHIC MANIPULATION The decision today to treat with Osteopathic Manipulative Therapy (OMT) was based on physical exam findings. Verbal consent was obtained following a discussion with the patient regarding the of risks, benefits and potential side effects, including an acute pain flare,post manipulation soreness and need for repeat treatments.     Contraindications to OMT: NONE  Manipulation was performed as below: Regions treated: Upper extremities OMT Techniques Used: HVLA muscle energy myofascial release soft tissue  The patient tolerated the treatment well and reported Improved symptoms following treatment today. Patient was given medications, exercises, stretches and lifestyle modifications per AVS and verbally.   OSTEOPATHIC/STRUCTURAL EXAM:   Anterior radial head, interosseous membrane torsion

## 2017-12-29 NOTE — Patient Instructions (Signed)
Pennsaid instructions: You have been given a sample/prescription for Pennsaid, a topical medication.     You are to apply this gel to your injured body part twice daily (morning and evening).   A little goes a long way so you can use about a pea-sized amount for each area.   Spread this small amount over the area into a thin film and let it dry.   Be sure that you do not rub the gel into your skin for more than 10 or 15 seconds otherwise it can irritate you skin.    Once you apply the gel, please do not put any other lotion or clothing in contact with that area for 30 minutes to allow the gel to absorb into your skin.   Some people are sensitive to the medication and can develop a sunburn-like rash.  If you have only mild symptoms it is okay to continue to use the medication but if you have any breakdown of your skin you should discontinue its use and please let us know.   If you have been written a prescription for Pennsaid, you will receive a pump bottle of this topical gel through a mail order pharmacy.  The instructions on the bottle will say to apply two pumps twice a day which may be too much gel for your particular area so use the pea-sized amount as your guide.  Instructions for Duexis, Pennsaid and Vimovo:  Your prescription will be filled through a participating [facility] mail order pharmacy.  You will receive a phone call or text from one of the participating pharmacies which can be located in any state in the [place].  You must communicate directly with them to have this medication filled.  When the pharmacy contacts you, they will need your mailing address (for shipment of the medication) andy they will need payment information if you have a copay (typically no more than $10). If you have not heard from them 2-3 days after your appointment with Dr. Rigby, contact [facility] directly at [phone].  

## 2017-12-30 ENCOUNTER — Other Ambulatory Visit (HOSPITAL_BASED_OUTPATIENT_CLINIC_OR_DEPARTMENT_OTHER): Payer: Self-pay

## 2017-12-30 DIAGNOSIS — G4733 Obstructive sleep apnea (adult) (pediatric): Secondary | ICD-10-CM

## 2018-01-01 ENCOUNTER — Ambulatory Visit: Payer: 59 | Attending: Neurology | Admitting: Neurology

## 2018-01-01 DIAGNOSIS — Z7984 Long term (current) use of oral hypoglycemic drugs: Secondary | ICD-10-CM | POA: Diagnosis not present

## 2018-01-01 DIAGNOSIS — E669 Obesity, unspecified: Secondary | ICD-10-CM | POA: Diagnosis not present

## 2018-01-01 DIAGNOSIS — G4733 Obstructive sleep apnea (adult) (pediatric): Secondary | ICD-10-CM | POA: Insufficient documentation

## 2018-01-01 DIAGNOSIS — Z79899 Other long term (current) drug therapy: Secondary | ICD-10-CM | POA: Diagnosis not present

## 2018-01-01 DIAGNOSIS — Z7982 Long term (current) use of aspirin: Secondary | ICD-10-CM | POA: Diagnosis not present

## 2018-01-05 ENCOUNTER — Ambulatory Visit: Payer: 59 | Admitting: Family Medicine

## 2018-01-05 MED FILL — ATORVASTATIN CALCIUM 20 MG: 20 | 90 days supply | Qty: 90 | Fill #0

## 2018-01-09 NOTE — Procedures (Signed)
[place] A. Merlene Laughter, MD     [email or web address]             HOME SLEEP STUDY LOCATION: [facility]   Patient Name: Carmen Mcintyre, Carmen Mcintyre Date: 01/02/2018 Gender: Female D.O.B: [birth year] Age (years): 33 Referring Provider: Phillips Odor MD, ABSM Height (inches): 61 Interpreting Physician: Phillips Odor MD, ABSM Weight (lbs): 168 RPSGT: Rosebud Poles BMI: 32 MRN: [id number] Neck Size: CLINICAL INFORMATION Sleep Study Type: HST     Indication for sleep study: OSA     Epworth Sleepiness Score: NA  SLEEP STUDY TECHNIQUE A multi-channel overnight portable sleep study was performed. The channels recorded were: nasal airflow, thoracic respiratory movement, and oxygen saturation with a pulse oximetry. Snoring was also monitored.  MEDICATIONS Patient self administered medications include: N/A.  Current Outpatient Medications:  .  amphetamine-dextroamphetamine (ADDERALL XR) 30 MG 24 hr capsule, Take 1 capsule (30 mg total) by mouth every morning., Disp: 90 capsule, Rfl: 0 .  amphetamine-dextroamphetamine (ADDERALL XR) 30 MG 24 hr capsule, Take 1 capsule (30 mg total) by mouth every morning., Disp: 90 capsule, Rfl: 0 .  [START ON 02/24/2018] amphetamine-dextroamphetamine (ADDERALL XR) 30 MG 24 hr capsule, Take 1 capsule (30 mg total) by mouth every morning., Disp: 90 capsule, Rfl: 0 .  aspirin 81 MG tablet, Take 1 tablet (81 mg total) by mouth daily., Disp: 30 tablet, Rfl: 1 .  atorvastatin (LIPITOR) 20 MG tablet, Take 1 tablet (20 mg total) by mouth daily., Disp: 90 tablet, Rfl: 1 .  calcium-vitamin D 250-100 MG-UNIT tablet, Take 1 tablet by mouth 2 (two) times daily., Disp: , Rfl:  .  Diclofenac Sodium (PENNSAID) 2 % SOLN, Place 1 application onto the skin 2 (two) times daily., Disp: 112 g, Rfl: 2 .  lisinopril (PRINIVIL,ZESTRIL) 10 MG tablet, Take 1 tablet (10 mg total) by mouth daily., Disp: 90 tablet, Rfl: 1 .  metFORMIN (GLUCOPHAGE) 500 MG tablet,  One tablet once daily, Disp: 90 tablet, Rfl: 3 .  Multiple Vitamins-Minerals (MULTIVITAMIN WITH MINERALS) tablet, Take 1 tablet by mouth daily., Disp: , Rfl:  .  nitroGLYCERIN (NITRODUR - DOSED IN MG/24 HR) 0.2 mg/hr patch, Place 1/4 to 1/2 of a patch over affected region. Remove and replace once daily.  Slightly alter skin placement daily (Patient not taking: Reported on 12/29/2017), Disp: 30 patch, Rfl: 1 .  ondansetron (ZOFRAN) 4 MG tablet, Take 1 tablet (4 mg total) by mouth every 8 (eight) hours as needed for nausea or vomiting., Disp: 30 tablet, Rfl: 0 .  venlafaxine XR (EFFEXOR-XR) 150 MG 24 hr capsule, Take 1 capsule (150 mg total) by mouth daily with breakfast., Disp: 90 capsule, Rfl: 3  Current Facility-Administered Medications:  .  0.9 %  sodium chloride infusion, 500 mL, Intravenous, Once, Pyrtle, Lajuan Lines, MD   SLEEP ARCHITECTURE Patient was studied for 1.4 minutes. The sleep efficiency was 23.7 % and the patient was supine for 92.9%. The arousal index was 0.0 per hour.  RESPIRATORY PARAMETERS The overall AHI was 42.9 per hour, with a central apnea index of 42.9 per hour.  The oxygen nadir was 0% during sleep.     CARDIAC DATA Mean heart rate during sleep was bpm.  IMPRESSIONS Severe obstructive sleep apnea occurred during this study (AHI = 42.9/h). AutoPAP 8- 15 is recommended.   Delano Metz, MD Diplomate, American Board of Sleep Medicine.  ELECTRONICALLY SIGNED ON:  01/09/2018, 12:39 PM [place]: [phone]   FX: [phone]  [place] [facility]IC[facility]

## 2018-01-19 DIAGNOSIS — L719 Rosacea, unspecified: Secondary | ICD-10-CM | POA: Diagnosis not present

## 2018-01-19 DIAGNOSIS — Z23 Encounter for immunization: Secondary | ICD-10-CM | POA: Diagnosis not present

## 2018-01-19 DIAGNOSIS — L570 Actinic keratosis: Secondary | ICD-10-CM | POA: Diagnosis not present

## 2018-02-09 ENCOUNTER — Ambulatory Visit: Payer: Self-pay

## 2018-02-09 ENCOUNTER — Encounter: Payer: Self-pay | Admitting: Sports Medicine

## 2018-02-09 ENCOUNTER — Ambulatory Visit (INDEPENDENT_AMBULATORY_CARE_PROVIDER_SITE_OTHER): Payer: 59 | Admitting: Sports Medicine

## 2018-02-09 VITALS — BP 120/78 | HR 94 | Ht 61.0 in | Wt 159.8 lb

## 2018-02-09 DIAGNOSIS — G5631 Lesion of radial nerve, right upper limb: Secondary | ICD-10-CM

## 2018-02-09 DIAGNOSIS — M7711 Lateral epicondylitis, right elbow: Secondary | ICD-10-CM

## 2018-02-09 DIAGNOSIS — M25521 Pain in right elbow: Secondary | ICD-10-CM

## 2018-02-09 MED ORDER — NITROGLYCERIN 0.2 MG/HR TD PT24: MEDICATED_PATCH | TRANSDERMAL | 1 refills | Status: DC

## 2018-02-09 MED FILL — NITROGLYCERIN 0.2 MG/HR PTC: 0.2 | 30 days supply | Qty: 15 | Fill #0

## 2018-02-09 NOTE — Progress Notes (Signed)
Juanda Bond. Jalaiyah Throgmorton, [place] at [facility] [phone]  Chandy Tarman - 52 y.o. female MRN [id number]  Date of birth: 1965/09/07  Visit Date:   PCP: Fayrene Helper, MD   Referred by: Fayrene Helper, MD   SUBJECTIVE:  [name] is here for Follow-up (R elbow pain)  HPI: Patient is here for a 6-week follow-up of right elbow pain.  She has continued to use a quarter of one patch of nitroglycerin over the lateral elbow and has had moderate improvement in her pain.  It is now nonradiating and rated as mild.  Occasionally she is having some sharp throbbing tightening pain over the radial aspect of the elbow but does not radiate past forearm.  She denies any numbness tingling swelling or burning type pain.  Occasionally takes ibuprofen.  Pennsaid was only minimally helpful.  REVIEW OF SYSTEMS: Denies night time disturbances. Denies fevers, chills, or night sweats. Denies unexplained weight loss. Denies personal history of cancer. Denies changes in bowel or bladder habits. Denies recent unreported falls. Denies new or worsening dyspnea or wheezing. Denies headaches or dizziness.  Denies numbness, tingling or weakness  In the extremities.  Denies dizziness or presyncopal episodes Denies lower extremity edema    HISTORY:  Prior history reviewed and updated per electronic medical record.  Social History  Occupational History  . Occupation: Employed   Tobacco Use  . Smoking status: Never Smoker  . Smokeless tobacco: Never Used  Substance and Sexual Activity  . Alcohol use: Yes    Comment: occ. wine  . Drug use: No  . Sexual activity: Yes   Social History  Social History Narrative  . Not on file      DATA OBTAINED & REVIEWED:  Recent Labs    02/10/17 0832 06/18/17 0909  HGBA1C 6.0* 6.1*  CALCIUM 9.6 9.6  AST 18 20  ALT 21 21   No problems updated. No specialty comments available.  OBJECTIVE:  VS:   HT:5\' 1"  (154.9 cm)   WT:159 lb 12.8 oz (72.5 kg)  BMI:30.21    BP:120/78  HR:94bpm  TEMP: ( )  RESP:98 %   PHYSICAL EXAM: CONSTITUTIONAL: Well-developed, Well-nourished and In no acute distress PSYCHIATRIC : Alert & appropriately interactive. and Not depressed or anxious appearing. RESPIRATORY : No increased work of breathing and Trachea Midline EYES : Pupils are equal., EOM intact without nystagmus. and No scleral icterus.  VASCULAR EXAM : Warm and well perfused NEURO: unremarkable  MSK Exam:  Right elbow  Well aligned, no significant deformity. No overlying skin changes. TTP over Lateral elbow.  No focal pain over radial head.  Letter markedly painful.  Common extensor musculature has improved and no significant pain over the arcade of froshe  Strength is well-preserved wrist extension, ulnar deviation and radial deviation.  Pain with textbook testing/resisted wrist extension     ASSESSMENT  1. Right elbow pain   2. Lateral epicondylitis of right elbow   3. Radial nerve compression, right     PLAN:  Pertinent additional documentation may be included in corresponding procedure notes, imaging studies, problem based documentation and patient instructions.  Procedures:  . None  Medications:  Meds ordered this encounter  Medications  . nitroGLYCERIN (NITRODUR - DOSED IN MG/24 HR) 0.2 mg/hr patch    Sig: Place 1/4 to 1/2 of a patch over affected region. Remove and replace once daily.  Slightly alter skin placement daily    Dispense:  30 patch    Refill:  1    For musculoskeletal purposes.  Okay to cut patch.   Discussion/Instructions: No problem-specific Assessment & Plan notes found for this encounter. . She is doing better but continues a slow improvement.  MSK ultrasound does show good neovascularity within the common extensor tendon. Ultimately she could be a candidate for PRP but has not failed nitro at this point.  We will have her continue with the  nitroglycerin patch and titrate up to a half a patch as tolerated.  Slight modification in the home exercises recommended today including addition of ulnar and radial deviation wrist excess extension and eccentric's.  Also work on supination pronation Discussed red flag symptoms that warrant earlier emergent evaluation and patient voices understanding. Activity modifications and the importance of avoiding exacerbating activities (limiting pain to no more than a 4 / 10 during or following activity) recommended and discussed.  Follow-up:  . Return in about 6 weeks (around 03/23/2018) for repeat clinical exam.  . If any lack of improvement: consider Biologic Therapy with PRP .          Gerda Diss, [place] Sports Medicine Physician

## 2018-02-09 NOTE — Procedures (Signed)
LIMITED MSK ULTRASOUND OF Right elbow Images were obtained and interpreted by myself, Teresa Coombs, DO  Images have been saved and stored to PACS system. Images obtained on: GE S7 Ultrasound machine  FINDINGS:   Lateral epicondyle has hypoechoic change within the common extensor tendon with moderate degree of increased neovascularity.  Substance as well peritendinous..  IMPRESSION:  1. Right lateral epicondylosis

## 2018-02-11 ENCOUNTER — Encounter: Payer: Self-pay | Admitting: Sports Medicine

## 2018-02-23 DIAGNOSIS — G467 Other lacunar syndromes: Secondary | ICD-10-CM | POA: Diagnosis not present

## 2018-02-23 DIAGNOSIS — R5382 Chronic fatigue, unspecified: Secondary | ICD-10-CM | POA: Diagnosis not present

## 2018-02-23 DIAGNOSIS — G450 Vertebro-basilar artery syndrome: Secondary | ICD-10-CM | POA: Diagnosis not present

## 2018-02-25 ENCOUNTER — Emergency Department (HOSPITAL_COMMUNITY): Admission: EM | Admit: 2018-02-25 | Discharge: 2018-02-25 | Discharge status: Absent without leave | Payer: 59

## 2018-02-25 ENCOUNTER — Ambulatory Visit: Payer: 59 | Attending: Neurology | Admitting: Neurology

## 2018-02-25 ENCOUNTER — Other Ambulatory Visit (HOSPITAL_BASED_OUTPATIENT_CLINIC_OR_DEPARTMENT_OTHER): Payer: Self-pay

## 2018-02-25 DIAGNOSIS — G4761 Periodic limb movement disorder: Secondary | ICD-10-CM | POA: Insufficient documentation

## 2018-02-25 DIAGNOSIS — G4733 Obstructive sleep apnea (adult) (pediatric): Secondary | ICD-10-CM

## 2018-02-25 DIAGNOSIS — Z79899 Other long term (current) drug therapy: Secondary | ICD-10-CM | POA: Insufficient documentation

## 2018-02-25 DIAGNOSIS — Z7982 Long term (current) use of aspirin: Secondary | ICD-10-CM | POA: Insufficient documentation

## 2018-02-25 MED FILL — LISINOPRIL 10 MG TABLET: 10 | 90 days supply | Qty: 90 | Fill #1

## 2018-02-25 MED FILL — VENLAFAXINE HCL ER 150 MG C: 150 | 90 days supply | Qty: 90 | Fill #1

## 2018-02-26 DIAGNOSIS — E785 Hyperlipidemia, unspecified: Secondary | ICD-10-CM | POA: Diagnosis not present

## 2018-02-26 DIAGNOSIS — I10 Essential (primary) hypertension: Secondary | ICD-10-CM | POA: Diagnosis not present

## 2018-02-26 DIAGNOSIS — R7303 Prediabetes: Secondary | ICD-10-CM | POA: Diagnosis not present

## 2018-02-26 MED FILL — NITROGLYCERIN 0.2 MG/HR PTC: 0.2 | 30 days supply | Qty: 15 | Fill #1

## 2018-02-27 LAB — HEMOGLOBIN A1C
EAG (MMOL/L): 6.3 (calc)
HEMOGLOBIN A1C: 5.6 %{Hb} (ref <5.7)
MEAN PLASMA GLUCOSE: 114 (calc)

## 2018-02-27 LAB — COMPLETE METABOLIC PANEL WITH GFR
AG RATIO: 1.8 (calc) (ref 1.0–2.5)
ALT: 13 U/L (ref 6–29)
AST: 12 U/L (ref 10–35)
Albumin: 4.5 g/dL (ref 3.6–5.1)
Alkaline phosphatase (APISO): 73 U/L (ref 33–130)
BUN: 18 mg/dL (ref 7–25)
CALCIUM: 9.9 mg/dL (ref 8.6–10.4)
CO2: 28 mmol/L (ref 20–32)
Chloride: 105 mmol/L (ref 98–110)
Creat: 0.71 mg/dL (ref 0.50–1.05)
GFR, EST AFRICAN AMERICAN: 113 mL/min/{1.73_m2} (ref >=60)
GFR, EST NON AFRICAN AMERICAN: 97 mL/min/{1.73_m2} (ref >=60)
Globulin: 2.5 g/dL (calc) (ref 1.9–3.7)
Glucose, Bld: 126 mg/dL (ref 65–139)
POTASSIUM: 4.5 mmol/L (ref 3.5–5.3)
Sodium: 141 mmol/L (ref 135–146)
TOTAL PROTEIN: 7 g/dL (ref 6.1–8.1)
Total Bilirubin: 0.4 mg/dL (ref 0.2–1.2)

## 2018-02-27 LAB — LIPID PANEL
Cholesterol: 142 mg/dL (ref <200)
HDL: 49 mg/dL — AB (ref >50)
LDL Cholesterol (Calc): 77 mg/dL (calc)
NON-HDL CHOLESTEROL (CALC): 93 mg/dL (ref <130)
Total CHOL/HDL Ratio: 2.9 (calc) (ref <5.0)
Triglycerides: 82 mg/dL (ref <150)

## 2018-03-05 ENCOUNTER — Other Ambulatory Visit: Payer: Self-pay | Admitting: Family Medicine

## 2018-03-05 DIAGNOSIS — R7303 Prediabetes: Secondary | ICD-10-CM

## 2018-03-05 MED FILL — ADDERALL XR 30 MG CAP SA: 30 | 90 days supply | Qty: 90 | Fill #0

## 2018-03-16 ENCOUNTER — Ambulatory Visit (INDEPENDENT_AMBULATORY_CARE_PROVIDER_SITE_OTHER): Payer: 59 | Admitting: Family Medicine

## 2018-03-16 ENCOUNTER — Encounter: Payer: Self-pay | Admitting: Family Medicine

## 2018-03-16 VITALS — BP 114/72 | HR 93 | Resp 15 | Ht 61.0 in | Wt 157.0 lb

## 2018-03-16 DIAGNOSIS — F418 Other specified anxiety disorders: Secondary | ICD-10-CM

## 2018-03-16 DIAGNOSIS — R7303 Prediabetes: Secondary | ICD-10-CM | POA: Diagnosis not present

## 2018-03-16 DIAGNOSIS — E8881 Metabolic syndrome: Secondary | ICD-10-CM

## 2018-03-16 DIAGNOSIS — I10 Essential (primary) hypertension: Secondary | ICD-10-CM

## 2018-03-16 DIAGNOSIS — R5382 Chronic fatigue, unspecified: Secondary | ICD-10-CM

## 2018-03-16 DIAGNOSIS — I639 Cerebral infarction, unspecified: Secondary | ICD-10-CM

## 2018-03-16 DIAGNOSIS — E785 Hyperlipidemia, unspecified: Secondary | ICD-10-CM | POA: Diagnosis not present

## 2018-03-16 DIAGNOSIS — E663 Overweight: Secondary | ICD-10-CM

## 2018-03-16 DIAGNOSIS — I999 Unspecified disorder of circulatory system: Secondary | ICD-10-CM | POA: Diagnosis not present

## 2018-03-16 DIAGNOSIS — G9332 Myalgic encephalomyelitis/chronic fatigue syndrome: Secondary | ICD-10-CM

## 2018-03-16 NOTE — Progress Notes (Signed)
Carmen Mcintyre     MRN: [id number]      DOB: 09/18/1965   HPI Carmen Mcintyre is here for follow up and re-evaluation of chronic medical conditions, medication management and review of any available recent lab and radiology data.  Preventive health is updated, specifically  Cancer screening and Immunization.   Questions or concerns regarding consultations or procedures which the Carmen Mcintyre has had in the interim are  Addressed. She has ruled OUT for sleep apnea, and states that Carmen Mcintyre mentioned addition of short acting adderall to help with her fatigue, will reach out to hoim to verify best dose, I suspect one tablet around lunchtime of lowest dose of short acting prep Her repeat scan has shown another infarct, hence which was totally unrecognized and  Asymptomatic as far as Carmen Mcintyre is concerned. 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 Denies sinus pressure, nasal congestion, ear pain or sore throat. Denies chest congestion, productive cough or wheezing. Denies chest pains, palpitations and leg swelling Denies abdominal pain, nausea, vomiting,diarrhea or constipation.   Denies dysuria, frequency, hesitancy or incontinence.  Denies headaches, seizures, numbness, or tingling. Denies depression, anxiety or insomnia. Denies skin break down or rash.   PE  BP 114/72   Pulse 93   Resp 15   Ht 5\' 1"  (1.549 m)   Wt 157 lb (71.2 kg)   LMP 08/22/2016   SpO2 97%   BMI 29.66 kg/m   Carmen Mcintyre.  HEENT: No facial asymmetry, EOMI,   oropharynx pink and  moist.  Neck supple no JVD, no mass.  Chest: Clear to auscultation bilaterally.  CVS: S1, S2 no murmurs, no S3.Regular rate.  ABD: Soft non tender.   Ext: No edema  MS: Adequate ROM spine, shoulders, hips and knees.  Skin: Intact, no ulcerations or rash noted.  Psych: Good eye contact, normal affect. Memory intact not anxious or depressed appearing.  CNS: CN 2-12 intact, power,  normal throughout.no focal deficits noted.   Assessment & Plan  HTN (hypertension) Controlled, no change in medication DASH diet and commitment to daily physical activity for a minimum of 30 minutes discussed and encouraged, as a part of hypertension management. The importance of attaining a healthy weight is also discussed.  BP/Weight 03/24/2018 03/16/2018 02/09/2018 12/29/2017 11/24/2017 11/17/2017 10/02/2033  Systolic BP 597 416 384 536 468 032 122  Diastolic BP 78 72 78 80 78 84 78  Wt. (Lbs) 154.12 157 159.8 168.8 165 171.4 168  BMI 29.12 29.66 30.19 31.89 31.18 32.39 31.74       Hyperlipidemia LDL goal <70 Controlled, no change in medication Hyperlipidemia:Low fat diet discussed and encouraged.   Lipid Panel  Lab Results  Component Value Date   CHOL 142 02/26/2018   HDL 49 (L) 02/26/2018   LDLCALC 77 02/26/2018   TRIG 82 02/26/2018   CHOLHDL 2.9 02/26/2018   Increased exercise encouraged    Overweight (BMI 25.0-29.9) Greatly improved, she is applauded on this. Carmen re-educated about  the importance of commitment to a  minimum of 150 minutes of exercise per week.  The importance of healthy food choices with portion control discussed.  Goals set by the Carmen for the next several months.   Weight /BMI 03/24/2018 03/16/2018 02/09/2018  WEIGHT 154 lb 1.9 oz 157 lb 159 lb 12.8 oz  HEIGHT 5\' 1"  5\' 1"  5\' 1"   BMI 29.12 kg/m2 29.66 kg/m2 30.19 kg/m2      Prediabetes Corrected and norrmalized with lifestyle change only over an 8 month period  Carmen educated about the importance  of limiting  Carbohydrate intake , the need to commit to daily physical activity for a minimum of 30 minutes , and to commit weight loss. The fact that changes in all these areas will reduce or eliminate all together the development of diabetes is stressed.   Diabetic Labs Latest Ref Rng & Units 02/26/2018 12/25/2017 06/18/2017 02/10/2017 10/31/2016  HbA1c <5.7 % of total Hgb 5.6 - 6.1(H) 6.0(H) -  Chol <200 mg/dL 142 - 138 149 171  HDL >50 mg/dL 49(L) - 46(L) 45(L) 45  Calc LDL mg/dL (calc) 77 - 69 81 108(H)  Triglycerides <150 mg/dL 82 - 146 132 89  Creatinine 0.50 - 1.05 mg/dL 0.71 0.70 0.79 0.81 -   BP/Weight 03/24/2018 03/16/2018 02/09/2018 12/29/2017 11/24/2017 11/17/2017 3/54/5625  Systolic BP 638 937 342 876 811 572 620  Diastolic BP 78 72 78 80 78 84 78  Wt. (Lbs) 154.12 157 159.8 168.8 165 171.4 168  BMI 29.12 29.66 30.19 31.89 31.18 32.39 31.74   No flowsheet data found.    Chronic fatigue disorder Will consult with Neurology re most appropriate dosing for additional medication and she is to continue current 30 mg daily dose  Depression with anxiety Controlled, no change in medication   Stroke (cerebrum) ([place]) Despite the fact that she is asymptomatic for CAD, she has established vascular disease , her aspirin dose has recently been doubled. She does have hTN, refer to cardiology for eval, she agreees  Vascular disease Carmen Mcintyre with recurrrent cerebrovascular events over a 12 month period and multiple risk factores for CAD and HTN refer to cardiology

## 2018-03-16 NOTE — Patient Instructions (Signed)
F/U in 5.5 months, call if you need me before  CONGRATS on improved lifestyle with great resultsGoal of weight loss  Is 8 to 10 pounds   You will be referred to Cardiology as discussed  Letter for stand up chair will be provided at checkout  CBC, fasting lipid, cmp and EGFr, CBC and TSH in 1 week befiore f/u (5.5 months)  Thank you  for choosing [facility]. We consider it a privelige to serve you.  Delivering excellent health care in a caring and  compassionate way is our goal.  Partnering with you,  so that together we can achieve this goal is our strategy.

## 2018-03-18 NOTE — Procedures (Signed)
[place] A. Merlene Laughter, MD     [email or web address]             NOCTURNAL POLYSOMNOGRAPHY   LOCATION: [facility]   Patient Name: Carmen Mcintyre, Carmen Mcintyre Date: 02/25/2018 Gender: Female D.O.B: 07-16-1965 Age (years): 25 Referring Provider: Phillips Odor MD, ABSM Height (inches): 61 Interpreting Physician: Phillips Odor MD, ABSM Weight (lbs): 168 RPSGT: Peak, Robert BMI: 32 MRN: [id number] Neck Size: 15.00 CLINICAL INFORMATION Sleep Study Type: NPSG     Indication for sleep study: N/A This was meant to be a CPAP titration study.  Because of suspected inaccuracies with the home sleep study, a split-night study was ordered.    Epworth Sleepiness Score: NA      Most recent home sleep testing dated 01/02/2018 revealed an AHI of 42.9/h and RDI of 42.9/h.  SLEEP STUDY TECHNIQUE As per the AASM Manual for the Scoring of Sleep and Associated Events v2.3 (April 2016) with a hypopnea requiring 4% desaturations.  The channels recorded and monitored were frontal, central and occipital EEG, electrooculogram (EOG), submentalis EMG (chin), nasal and oral airflow, thoracic and abdominal wall motion, anterior tibialis EMG, snore microphone, electrocardiogram, and pulse oximetry.  MEDICATIONS Medications self-administered by patient taken the night of the study : N/A  Current Outpatient Medications:  .  amphetamine-dextroamphetamine (ADDERALL XR) 30 MG 24 hr capsule, Take 1 capsule (30 mg total) by mouth every morning., Disp: 90 capsule, Rfl: 0 .  amphetamine-dextroamphetamine (ADDERALL XR) 30 MG 24 hr capsule, Take 1 capsule (30 mg total) by mouth every morning., Disp: 90 capsule, Rfl: 0 .  amphetamine-dextroamphetamine (ADDERALL XR) 30 MG 24 hr capsule, Take 1 capsule (30 mg total) by mouth every morning., Disp: 90 capsule, Rfl: 0 .  aspirin 81 MG tablet, Take 1 tablet (81 mg total) by mouth daily., Disp: 30 tablet, Rfl: 1 .  atorvastatin (LIPITOR) 20 MG tablet, Take 1  tablet (20 mg total) by mouth daily., Disp: 90 tablet, Rfl: 1 .  calcium-vitamin D 250-100 MG-UNIT tablet, Take 1 tablet by mouth 2 (two) times daily., Disp: , Rfl:  .  lisinopril (PRINIVIL,ZESTRIL) 10 MG tablet, TAKE 1 TABLET BY MOUTH DAILY., Disp: 90 tablet, Rfl: 1 .  Multiple Vitamins-Minerals (MULTIVITAMIN WITH MINERALS) tablet, Take 1 tablet by mouth daily., Disp: , Rfl:  .  nitroGLYCERIN (NITRODUR - DOSED IN MG/24 HR) 0.2 mg/hr patch, Place 1/4 to 1/2 of a patch over affected region. Remove and replace once daily.  Slightly alter skin placement daily, Disp: 30 patch, Rfl: 1 .  venlafaxine XR (EFFEXOR-XR) 150 MG 24 hr capsule, Take 1 capsule (150 mg total) by mouth daily with breakfast., Disp: 90 capsule, Rfl: 3  Current Facility-Administered Medications:  .  0.9 %  sodium chloride infusion, 500 mL, Intravenous, Once, Pyrtle, Lajuan Lines, MD     SLEEP ARCHITECTURE The study was initiated at 10:04:43 PM and ended at 5:17:49 AM.  Sleep onset time was 37.9 minutes and the sleep efficiency was 83.7%%. The total sleep time was 362.5 minutes.  Stage REM latency was 314.5 minutes.  The patient spent 5.8%% of the night in stage N1 sleep, 89.5%% in stage N2 sleep, 3.9%% in stage N3 and 0.8% in REM.  Alpha intrusion was absent.  Supine sleep was 1.52%.  RESPIRATORY PARAMETERS The overall apnea/hypopnea index (AHI) was 0.2 per hour. There were 1 total apneas, including 0 obstructive, 1 central and 0 mixed apneas. There were 0 hypopneas and 52 RERAs.  The AHI during Stage REM sleep was 0.0  per hour.  AHI while supine was 0.0 per hour.  The mean oxygen saturation was 94.8%. The minimum SpO2 during sleep was 90.0%.  soft snoring was noted during this study.  CARDIAC DATA The 2 lead EKG demonstrated sinus rhythm. The mean heart rate was N/A beats per minute. Other EKG findings include: PVCs.  LEG MOVEMENT DATA There is mild to moderate periodic limb movement disorder by visual  inspection.  IMPRESSIONS 1.  There is mild to moderate periodic limb movement disorder. 2.  No evidence of obstructive or central sleep apnea syndrome by this definitive study.  The initial home sleep study is likely inaccurate.   Delano Metz, MD Diplomate, American Board of Sleep Medicine.  ELECTRONICALLY SIGNED ON:  03/18/2018, 2:36 PM [place] PH: [phone]   FX: [phone] [place]

## 2018-03-19 ENCOUNTER — Encounter: Payer: Self-pay | Admitting: Family Medicine

## 2018-03-21 ENCOUNTER — Other Ambulatory Visit: Payer: Self-pay | Admitting: Family Medicine

## 2018-03-21 ENCOUNTER — Encounter: Payer: Self-pay | Admitting: Family Medicine

## 2018-03-21 MED ORDER — PREDNISONE 5 MG PO TABS: ORAL_TABLET | ORAL | 0 refills | Status: DC

## 2018-03-21 MED ORDER — BENZONATATE 100 MG PO CAPS: 100.0000 mg | ORAL_CAPSULE | Freq: Two times a day (BID) | ORAL | 0 refills | Status: DC | PRN

## 2018-03-21 MED ORDER — ALBUTEROL SULFATE HFA 108 (90 BASE) MCG/ACT IN AERS: 2.0000 | INHALATION_SPRAY | Freq: Four times a day (QID) | RESPIRATORY_TRACT | 0 refills | Status: DC | PRN

## 2018-03-21 MED ORDER — CHLORPHENIRAMINE MALEATE 4 MG PO TABS: 4.0000 mg | ORAL_TABLET | Freq: Two times a day (BID) | ORAL | 0 refills | Status: DC | PRN

## 2018-03-21 MED ORDER — PROMETHAZINE-DM 6.25-15 MG/5ML PO SYRP: ORAL_SOLUTION | ORAL | 0 refills | Status: AC

## 2018-03-23 ENCOUNTER — Ambulatory Visit: Payer: 59 | Admitting: Sports Medicine

## 2018-03-24 ENCOUNTER — Ambulatory Visit (INDEPENDENT_AMBULATORY_CARE_PROVIDER_SITE_OTHER): Payer: 59 | Admitting: Family Medicine

## 2018-03-24 ENCOUNTER — Encounter: Payer: Self-pay | Admitting: Family Medicine

## 2018-03-24 ENCOUNTER — Ambulatory Visit (HOSPITAL_COMMUNITY)
Admission: RE | Admit: 2018-03-24 | Discharge: 2018-03-24 | Disposition: A | Discharge status: Home / Self Care | Payer: 59 | Source: Ambulatory Visit | Attending: Family Medicine | Admitting: Family Medicine

## 2018-03-24 VITALS — BP 108/78 | HR 101 | Temp 98.3°F | Resp 14 | Ht 61.0 in | Wt 154.1 lb

## 2018-03-24 DIAGNOSIS — J040 Acute laryngitis: Secondary | ICD-10-CM | POA: Diagnosis not present

## 2018-03-24 DIAGNOSIS — J209 Acute bronchitis, unspecified: Secondary | ICD-10-CM | POA: Diagnosis not present

## 2018-03-24 DIAGNOSIS — I10 Essential (primary) hypertension: Secondary | ICD-10-CM | POA: Diagnosis not present

## 2018-03-24 DIAGNOSIS — J0110 Acute frontal sinusitis, unspecified: Secondary | ICD-10-CM | POA: Diagnosis not present

## 2018-03-24 DIAGNOSIS — R05 Cough: Secondary | ICD-10-CM | POA: Diagnosis not present

## 2018-03-24 MED ORDER — SULFAMETHOXAZOLE-TRIMETHOPRIM 800-160 MG PO TABS: 1.0000 | ORAL_TABLET | Freq: Two times a day (BID) | ORAL | 0 refills | Status: DC

## 2018-03-24 NOTE — Patient Instructions (Signed)
F/U as before, call if you need me sooner  You are being treated for acute frontal sinusitis, acute bronchitis and acute laryngitis  Please fill and start the antibiotic septra, prescribed for 10 days total  Please get CXR today, Result note will be sent to you.  Rest, fluids, and medication with time  Work excuse from 01/15 to return 03/29/2018 will be given to you by nurse today

## 2018-03-29 ENCOUNTER — Encounter: Payer: Self-pay | Admitting: Family Medicine

## 2018-03-29 DIAGNOSIS — I999 Unspecified disorder of circulatory system: Secondary | ICD-10-CM | POA: Insufficient documentation

## 2018-03-29 MED ORDER — AMPHETAMINE-DEXTROAMPHET ER 30 MG PO CP24: 30.0000 mg | ORAL_CAPSULE | ORAL | 0 refills | Status: DC

## 2018-03-29 NOTE — Assessment & Plan Note (Signed)
Controlled, no change in medication DASH diet and commitment to daily physical activity for a minimum of 30 minutes discussed and encouraged, as a part of hypertension management. The importance of attaining a healthy weight is also discussed.  BP/Weight 03/24/2018 03/16/2018 02/09/2018 12/29/2017 11/24/2017 11/17/2017 8/00/6349  Systolic BP 494 473 958 441 712 787 183  Diastolic BP 78 72 78 80 78 84 78  Wt. (Lbs) 154.12 157 159.8 168.8 165 171.4 168  BMI 29.12 29.66 30.19 31.89 31.18 32.39 31.74

## 2018-03-29 NOTE — Assessment & Plan Note (Addendum)
Corrected and norrmalized with lifestyle change only over an 8 month period  Patient educated about the importance of limiting  Carbohydrate intake , the need to commit to daily physical activity for a minimum of 30 minutes , and to commit weight loss. The fact that changes in all these areas will reduce or eliminate all together the development of diabetes is stressed.   Diabetic Labs Latest Ref Rng & Units 02/26/2018 12/25/2017 06/18/2017 02/10/2017 10/31/2016  HbA1c <5.7 % of total Hgb 5.6 - 6.1(H) 6.0(H) -  Chol <200 mg/dL 142 - 138 149 171  HDL >50 mg/dL 49(L) - 46(L) 45(L) 45  Calc LDL mg/dL (calc) 77 - 69 81 108(H)  Triglycerides <150 mg/dL 82 - 146 132 89  Creatinine 0.50 - 1.05 mg/dL 0.71 0.70 0.79 0.81 -   BP/Weight 03/24/2018 03/16/2018 02/09/2018 12/29/2017 11/24/2017 11/17/2017 3/83/8184  Systolic BP 037 543 606 770 340 352 481  Diastolic BP 78 72 78 80 78 84 78  Wt. (Lbs) 154.12 157 159.8 168.8 165 171.4 168  BMI 29.12 29.66 30.19 31.89 31.18 32.39 31.74   No flowsheet data found.

## 2018-03-29 NOTE — Assessment & Plan Note (Signed)
Controlled, no change in medication  

## 2018-03-29 NOTE — Assessment & Plan Note (Signed)
Greatly improved, she is applauded on this. Patient re-educated about  the importance of commitment to a  minimum of 150 minutes of exercise per week.  The importance of healthy food choices with portion control discussed.  Goals set by the patient for the next several months.   Weight /BMI 03/24/2018 03/16/2018 02/09/2018  WEIGHT 154 lb 1.9 oz 157 lb 159 lb 12.8 oz  HEIGHT 5\' 1"  5\' 1"  5\' 1"   BMI 29.12 kg/m2 29.66 kg/m2 30.19 kg/m2

## 2018-03-29 NOTE — Assessment & Plan Note (Signed)
Voice rest, grgles with salt water , honey and lime for symptom relief

## 2018-03-29 NOTE — Assessment & Plan Note (Signed)
Controlled, no change in medication Hyperlipidemia:Low fat diet discussed and encouraged.   Lipid Panel  Lab Results  Component Value Date   CHOL 142 02/26/2018   HDL 49 (L) 02/26/2018   LDLCALC 77 02/26/2018   TRIG 82 02/26/2018   CHOLHDL 2.9 02/26/2018   Increased exercise encouraged

## 2018-03-29 NOTE — Assessment & Plan Note (Signed)
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

## 2018-03-29 NOTE — Assessment & Plan Note (Signed)
Antibiotic prescribed , work excuse to return 03/29/2018, saline nasal flushes recommended

## 2018-03-29 NOTE — Assessment & Plan Note (Signed)
Despite the fact that she is asymptomatic for CAD, she has established vascular disease , her aspirin dose has recently been doubled. She does have hTN, refer to cardiology for eval, she agreees

## 2018-03-29 NOTE — Assessment & Plan Note (Signed)
Will consult with Neurology re most appropriate dosing for additional medication and she is to continue current 30 mg daily dose

## 2018-03-29 NOTE — Progress Notes (Signed)
   Carmen Mcintyre     MRN: [id number]      DOB: [birth year]   HPI Carmen Mcintyre is here 5 day  h/o worsening head and chest congestion, associated with chills intermittently.and excessive fatigue Nasal drainage has thickened , and is yellowish green, and at times bloody. Sputum is thick and yellow. C/o progessive painless loss of voice with the illness Increasing fatigue , poor appetitie and sleep disturbed by cough. No improvement with OTC medication. Symptomatic meds, tessalon perle, and albuteroml have been started   ROS . Denies chest pains, palpitations and leg swelling Denies abdominal pain, nausea, vomiting,diarrhea or constipation.   Denies dysuria, frequency, hesitancy or incontinence. Denies joint pain, swelling and limitation in mobility. C/o frontal headache. Denies depression, anxiety or insomnia. Denies skin break down or rash.   PE  BP 108/78 (BP Location: Left Arm, Patient Position: Sitting, Cuff Size: Normal)   Pulse (!) 101   Temp 98.3 F (36.8 C) (Oral)   Resp 14   Ht 5\' 1"  (1.549 m)   Wt 154 lb 1.9 oz (69.9 kg)   LMP 08/22/2016   SpO2 95% Comment: room air  BMI 29.12 kg/m   Patient alert and oriented and in no cardiopulmonary distress. Ill appearing HEENT: No facial asymmetry, EOMI,   oropharynx pink and moist.  Neck supple anterior cervical adenitis, frontal tenderness, TM clear  Chest: decreased air entry with basilar crackles on l right and wheezing  CVS: S1, S2 no murmurs, no S3.Regular rate.  ABD: Soft non tender.   Ext: No edema  MS: Adequate ROM spine, shoulders, hips and knees.  Skin: Intact, no ulcerations or rash noted.  Psych: Good eye contact, normal affect. Memory intact not anxious or depressed appearing.  CNS: CN 2-12 intact, power,  normal throughout.no focal deficits noted.   Assessment & Plan  Acute frontal sinusitis Antibiotic prescribed , work excuse to return 03/29/2018, saline nasal flushes recommended  Acute  laryngitis Voice rest, grgles with salt water , honey and lime for symptom relief  Acute bronchitis CXR, antibiotic, septra, decongestant and cough suppressant Work excuse until 03/29/2018  HTN (hypertension) Controlled, no change in medication

## 2018-03-29 NOTE — Assessment & Plan Note (Signed)
CXR, antibiotic, septra, decongestant and cough suppressant Work excuse until 03/29/2018

## 2018-03-31 ENCOUNTER — Other Ambulatory Visit: Payer: Self-pay | Admitting: Family Medicine

## 2018-03-31 ENCOUNTER — Encounter: Payer: Self-pay | Admitting: Family Medicine

## 2018-03-31 MED ORDER — AMPHETAMINE-DEXTROAMPHETAMINE 10 MG PO TABS: ORAL_TABLET | ORAL | 0 refills | Status: DC

## 2018-03-31 MED FILL — AMPHETAMINE-DEXTROAMPHETAMI: 10 | 90 days supply | Qty: 90 | Fill #0

## 2018-04-01 MED FILL — ATORVASTATIN CALCIUM 20 MG: 20 | 90 days supply | Qty: 90 | Fill #1

## 2018-05-10 NOTE — Telephone Encounter (Signed)
Note sent to nurse. 

## 2018-05-27 ENCOUNTER — Encounter: Payer: Self-pay | Admitting: Internal Medicine

## 2018-05-31 ENCOUNTER — Telehealth: Payer: Self-pay | Admitting: Internal Medicine

## 2018-05-31 NOTE — Telephone Encounter (Signed)
Staff message to chart prep to request records from Dr. Jyl Heinz in neurology.

## 2018-05-31 NOTE — Telephone Encounter (Signed)
Called pt  She is a Marine scientist with [employer] Follows with Dr Moshe Cipro   Hx of TIA in 2018    Sees Dr Merlene Laughter   MRI in 01/2018  She is doing well   On ASA and Statin Referred for vascular assessment No active symptoms  With current corona virus situation would recomm deferring to June, July when conditoins improved    Pt understands Would recomm getting records from Dr Merlene Laughter

## 2018-05-31 NOTE — Telephone Encounter (Signed)
Cancelled appointment for 3/27.  Will forward to r/s pool

## 2018-06-02 ENCOUNTER — Other Ambulatory Visit: Payer: Self-pay | Admitting: *Deleted

## 2018-06-02 MED ORDER — AMPHETAMINE-DEXTROAMPHET ER 30 MG PO CP24: 30.0000 mg | ORAL_CAPSULE | ORAL | 0 refills | Status: DC

## 2018-06-02 MED FILL — ADDERALL XR 30 MG CAP SA: 30 | 90 days supply | Qty: 90 | Fill #0

## 2018-06-02 NOTE — Telephone Encounter (Signed)
Routed to Cherly [place] NP for escribe refill.

## 2018-06-02 NOTE — Telephone Encounter (Signed)
Pt called needing a refill on her adderall sent to [facility] so she can receive it through the mail.

## 2018-06-04 ENCOUNTER — Ambulatory Visit: Payer: 59 | Admitting: Internal Medicine

## 2018-06-22 ENCOUNTER — Other Ambulatory Visit: Payer: Self-pay | Admitting: Family Medicine

## 2018-06-22 DIAGNOSIS — R232 Flushing: Secondary | ICD-10-CM

## 2018-06-22 MED FILL — VENLAFAXINE HCL ER 150 MG C: 150 | 90 days supply | Qty: 90 | Fill #2

## 2018-06-22 MED FILL — LISINOPRIL 10 MG TABLET: 10 | 90 days supply | Qty: 90 | Fill #0

## 2018-06-23 MED FILL — ATORVASTATIN 20 MG TABLET: 20 | 90 days supply | Qty: 90 | Fill #0

## 2018-07-12 ENCOUNTER — Encounter: Payer: Self-pay | Admitting: Family Medicine

## 2018-07-13 ENCOUNTER — Other Ambulatory Visit: Payer: Self-pay

## 2018-07-13 ENCOUNTER — Ambulatory Visit (INDEPENDENT_AMBULATORY_CARE_PROVIDER_SITE_OTHER): Payer: 59 | Admitting: Family Medicine

## 2018-07-13 ENCOUNTER — Encounter: Payer: Self-pay | Admitting: Family Medicine

## 2018-07-13 VITALS — BP 120/73 | HR 78 | Ht 61.0 in | Wt 154.0 lb

## 2018-07-13 DIAGNOSIS — R42 Dizziness and giddiness: Secondary | ICD-10-CM | POA: Diagnosis not present

## 2018-07-13 DIAGNOSIS — H9202 Otalgia, left ear: Secondary | ICD-10-CM | POA: Diagnosis not present

## 2018-07-13 DIAGNOSIS — J302 Other seasonal allergic rhinitis: Secondary | ICD-10-CM

## 2018-07-13 DIAGNOSIS — I10 Essential (primary) hypertension: Secondary | ICD-10-CM

## 2018-07-13 MED ORDER — CHLORPHENIRAMINE MALEATE 4 MG PO TABS: ORAL_TABLET | ORAL | 0 refills | Status: DC

## 2018-07-13 MED ORDER — MECLIZINE HCL 12.5 MG PO TABS: 12.5000 mg | ORAL_TABLET | Freq: Three times a day (TID) | ORAL | 0 refills | Status: DC | PRN

## 2018-07-13 MED ORDER — AZITHROMYCIN 250 MG PO TABS: ORAL_TABLET | ORAL | 0 refills | Status: DC

## 2018-07-13 NOTE — Patient Instructions (Addendum)
F/u as before, call if you need me sooner  Need to continuie allergy medication every day as before  Chlorpheniramine is added to help to relieve pressure in the ear  For vertigo, which has improved, I have prescribed antivert/ meclizine which is OTC, use ONLY IF NEEDED, and be careful not to fall!  As of now, today, I see NO indication for antibiotic,HOWEVER, as discussed, if you develop fever , worsening ear pain or pressure, please do fill and take the Z pack which is prescribed to be filled no earlier  Than 07/16/2018

## 2018-07-13 NOTE — Progress Notes (Signed)
Virtual Visit via Telephone Note  I connected with Carmen Mcintyre on 07/13/18 at 10:00 AM EDT by telephone and verified that I am speaking with the correct person using two identifiers.  Location: Patient: home  Provider: office   I discussed the limitations, risks, security and privacy concerns of performing an evaluation and management service by telephone and the availability of in person appointments. I also discussed with the patient that there may be a patient responsible charge related to this service. The patient expressed understanding and agreed to proceed.   History of Present Illness: Severe vertigo 1 day ago when pt woke up, not as severe today C/o left ear fullness and discomfort as well as clear runny nose in the preceding week, denies fever or productive cough Otherwise has been doing well prior to this Denies recent fever or chills. Denies chest pains, palpitations and leg swelling Denies abdominal pain, nausea, vomiting,diarrhea or constipation.   Denies dysuria, frequency, hesitancy or incontinence. Denies joint pain, swelling and limitation in mobility. Denies headaches, seizures, numbness, or tingling. Denies uncontrolled  depression, anxiety or insomnia. Denies skin break down or rash.       Observations/Objective: BP 120/73   Pulse 78   Ht 5\' 1"  (1.549 m)   Wt 154 lb (69.9 kg)   LMP 08/22/2016   BMI 29.10 kg/m  Good communication with no confusion and intact memory. Alert and oriented x 3 No signs of respiratory distress during sppech    Assessment and Plan: Acute severe vertigo Symptomatic x 1 day, already showing improvement Pt educated and meclizine recommended  in limited supply for as needed use, pt will get this OTC  Seasonal allergies Increased and uncontrolled symptoms, pt to commit to daily use of allergy meds  HTN (hypertension) Controlled, no change in medication DASH diet and commitment to daily physical activity for a minimum  of 30 minutes discussed and encouraged, as a part of hypertension management. The importance of attaining a healthy weight is also discussed.  BP/Weight 07/13/2018 03/24/2018 03/16/2018 02/09/2018 12/29/2017 11/24/2017 09/26/9468  Systolic BP 962 836 629 476 546 503 546  Diastolic BP 73 78 72 78 80 78 84  Wt. (Lbs) 154 154.12 157 159.8 168.8 165 171.4  BMI 29.1 29.12 29.66 30.19 31.89 31.18 32.39       Otalgia, left ear Mildly symptomatic x several days, no fever or drainage, hold on filling Z pack prescribed for 3 additional days, unless acutely worsens, educated about this and agrees    Follow Up Instructions:    I discussed the assessment and treatment plan with the patient. The patient was provided an opportunity to ask questions and all were answered. The patient agreed with the plan and demonstrated an understanding of the instructions.   The patient was advised to call back or seek an in-person evaluation if the symptoms worsen or if the condition fails to improve as anticipated.  I provided 15 minutes of non-face-to-face time during this encounter.   Tula Nakayama, MD

## 2018-07-19 DIAGNOSIS — R42 Dizziness and giddiness: Secondary | ICD-10-CM | POA: Insufficient documentation

## 2018-07-19 DIAGNOSIS — H9202 Otalgia, left ear: Secondary | ICD-10-CM | POA: Insufficient documentation

## 2018-07-19 NOTE — Assessment & Plan Note (Signed)
Symptomatic x 1 day, already showing improvement Pt educated and meclizine recommended  in limited supply for as needed use, pt will get this OTC

## 2018-07-19 NOTE — Assessment & Plan Note (Signed)
Mildly symptomatic x several days, no fever or drainage, hold on filling Z pack prescribed for 3 additional days, unless acutely worsens, educated about this and agrees

## 2018-07-19 NOTE — Assessment & Plan Note (Signed)
Controlled, no change in medication DASH diet and commitment to daily physical activity for a minimum of 30 minutes discussed and encouraged, as a part of hypertension management. The importance of attaining a healthy weight is also discussed.  BP/Weight 07/13/2018 03/24/2018 03/16/2018 02/09/2018 12/29/2017 11/24/2017 03/23/6429  Systolic BP 427 670 110 034 961 164 353  Diastolic BP 73 78 72 78 80 78 84  Wt. (Lbs) 154 154.12 157 159.8 168.8 165 171.4  BMI 29.1 29.12 29.66 30.19 31.89 31.18 32.39

## 2018-07-19 NOTE — Assessment & Plan Note (Signed)
Increased and uncontrolled symptoms, pt to commit to daily use of allergy meds

## 2018-08-03 ENCOUNTER — Other Ambulatory Visit: Payer: Self-pay | Admitting: Family Medicine

## 2018-08-05 ENCOUNTER — Other Ambulatory Visit: Payer: Self-pay | Admitting: Family Medicine

## 2018-08-08 ENCOUNTER — Other Ambulatory Visit: Payer: Self-pay | Admitting: Family Medicine

## 2018-08-08 ENCOUNTER — Encounter: Payer: Self-pay | Admitting: Family Medicine

## 2018-08-08 NOTE — Progress Notes (Signed)
Nex t fill adderall due June 23

## 2018-08-16 ENCOUNTER — Ambulatory Visit: Payer: 59 | Admitting: Family Medicine

## 2018-08-17 ENCOUNTER — Other Ambulatory Visit: Payer: Self-pay | Admitting: Family Medicine

## 2018-08-17 ENCOUNTER — Telehealth: Payer: Self-pay

## 2018-08-17 ENCOUNTER — Encounter: Payer: Self-pay | Admitting: Family Medicine

## 2018-08-17 ENCOUNTER — Other Ambulatory Visit: Payer: Self-pay

## 2018-08-17 DIAGNOSIS — E8881 Metabolic syndrome: Secondary | ICD-10-CM

## 2018-08-17 DIAGNOSIS — I10 Essential (primary) hypertension: Secondary | ICD-10-CM

## 2018-08-17 DIAGNOSIS — E785 Hyperlipidemia, unspecified: Secondary | ICD-10-CM

## 2018-08-17 NOTE — Telephone Encounter (Signed)
Expired labs re-entered

## 2018-08-18 ENCOUNTER — Other Ambulatory Visit: Payer: Self-pay

## 2018-08-18 ENCOUNTER — Encounter: Payer: Self-pay | Admitting: Family Medicine

## 2018-08-18 ENCOUNTER — Ambulatory Visit: Payer: 59 | Admitting: Family Medicine

## 2018-08-18 ENCOUNTER — Ambulatory Visit (INDEPENDENT_AMBULATORY_CARE_PROVIDER_SITE_OTHER): Payer: 59 | Admitting: Family Medicine

## 2018-08-18 VITALS — BP 118/78 | HR 97 | Temp 98.1°F | Resp 15 | Ht 61.0 in | Wt 157.0 lb

## 2018-08-18 DIAGNOSIS — E663 Overweight: Secondary | ICD-10-CM

## 2018-08-18 DIAGNOSIS — I10 Essential (primary) hypertension: Secondary | ICD-10-CM | POA: Diagnosis not present

## 2018-08-18 DIAGNOSIS — J302 Other seasonal allergic rhinitis: Secondary | ICD-10-CM | POA: Diagnosis not present

## 2018-08-18 DIAGNOSIS — E785 Hyperlipidemia, unspecified: Secondary | ICD-10-CM

## 2018-08-18 DIAGNOSIS — R7303 Prediabetes: Secondary | ICD-10-CM

## 2018-08-18 DIAGNOSIS — F418 Other specified anxiety disorders: Secondary | ICD-10-CM | POA: Diagnosis not present

## 2018-08-18 DIAGNOSIS — R5382 Chronic fatigue, unspecified: Secondary | ICD-10-CM

## 2018-08-18 DIAGNOSIS — G9332 Myalgic encephalomyelitis/chronic fatigue syndrome: Secondary | ICD-10-CM

## 2018-08-18 NOTE — Patient Instructions (Addendum)
F/U in 5 . 5 months, call if you need me before  Congrats on excellent labs  Please commit to more regular exercise  Np medication changes  Pls schedule pap   Consider the shingles vaccine  Recombinant Zoster (Shingles) Vaccine, RZV: What You Need to Know 1. Why get vaccinated? Shingles (also called herpes zoster, or just zoster) is a painful skin rash, often with blisters. Shingles is caused by the varicella zoster virus, the same virus that causes chickenpox. After you have chickenpox, the virus stays in your body and can cause shingles later in life. You can't catch shingles from another person. However, a person who has never had chickenpox (or chickenpox vaccine) could get chickenpox from someone with shingles. A shingles rash usually appears on one side of the face or body and heals within 2 to 4 weeks. Its main symptom is pain, which can be severe. Other symptoms can include fever, headache, chills, and upset stomach. Very rarely, a shingles infection can lead to pneumonia, hearing problems, blindness, brain inflammation (encephalitis), or death. For about 1 person in 5, severe pain can continue even long after the rash has cleared up. This long-lasting pain is called post-herpetic neuralgia (PHN). Shingles is far more common in people 45 years of age and older than in younger people, and the risk increases with age. It is also more common in people whose immune system is weakened because of a disease such as cancer, or by drugs such as steroids or chemotherapy. At least 1 million people a year in the [place] get shingles. 2. Shingles vaccine (recombinant) Recombinant shingles vaccine was approved by FDA in 2017 for the prevention of shingles. In clinical trials, it was more than 90% effective in preventing shingles. It can also reduce the likelihood of PHN. Two doses, 2 to 6 months apart, are recommended for adults 61 and older. This vaccine is also recommended for people who  have already gotten the live shingles vaccine (Zostavax). There is no live virus in this vaccine. 3. Some people should not get this vaccine Tell your vaccine provider if you:  Have any severe, life-threatening allergies. A person who has ever had a life-threatening allergic reaction after a dose of recombinant shingles vaccine, or has a severe allergy to any component of this vaccine, may be advised not to be vaccinated. Ask your health care provider if you want information about vaccine components.  Are pregnant or breastfeeding. There is not much information about use of recombinant shingles vaccine in pregnant or nursing women. Your healthcare provider might recommend delaying vaccination.  Are not feeling well. If you have a mild illness, such as a cold, you can probably get the vaccine today. If you are moderately or severely ill, you should probably wait until you recover. Your doctor can advise you. 4. Risks of a vaccine reaction With any medicine, including vaccines, there is a chance of reactions. After recombinant shingles vaccination, a person might experience:  Pain, redness, soreness, or swelling at the site of the injection  Headache, muscle aches, fever, shivering, fatigue In clinical trials, most people got a sore arm with mild or moderate pain after vaccination, and some also had redness and swelling where they got the shot. Some people felt tired, had muscle pain, a headache, shivering, fever, stomach pain, or nausea. About 1 out of 6 people who got recombinant zoster vaccine experienced side effects that prevented them from doing regular activities. Symptoms went away on their own in about 2  to 3 days. Side effects were more common in younger people. You should still get the second dose of recombinant zoster vaccine even if you had one of these reactions after the first dose. Other things that could happen after this vaccine:  People sometimes faint after medical procedures,  including vaccination. Sitting or lying down for about 15 minutes can help prevent fainting and injuries caused by a fall. Tell your provider if you feel dizzy or have vision changes or ringing in the ears.  Some people get shoulder pain that can be more severe and longer-lasting than routine soreness that can follow injections. This happens very rarely.  Any medication can cause a severe allergic reaction. Such reactions to a vaccine are estimated at about 1 in a million doses, and would happen within a few minutes to a few hours after the vaccination. As with any medicine, there is a very remote chance of a vaccine causing a serious injury or death. The safety of vaccines is always being monitored. For more information, visit: http://www.aguilar.org/ 5. What if there is a serious problem? What should I look for?  Look for anything that concerns you, such as signs of a severe allergic reaction, very high fever, or unusual behavior. Signs of a severe allergic reaction can include hives, swelling of the face and throat, difficulty breathing, a fast heartbeat, dizziness, and weakness. These would usually start a few minutes to a few hours after the vaccination. What should I do?  If you think it is a severe allergic reaction or other emergency that can't wait, call 9-1-1 or get to the nearest hospital. Otherwise, call your health care provider. Afterward, the reaction should be reported to the Vaccine Adverse Event Reporting System (VAERS). Your doctor should file this report, or you can do it yourself through the VAERS website at www.vaers.SamedayNews.es, or by calling (803)363-2657. VAERS does not give medical advice. 6. How can I learn more?  Ask your health care provider. He or she can give you the vaccine package insert or suggest other sources of information.  Call your local or state health department.  Contact the Centers for Disease Control and Prevention (CDC): ? Call 203-525-3413  (1-800-CDC-INFO) or ? Visit CDC's vaccines website at http://hunter.com/ CDC Vaccine Information Statement Recombinant Zoster Vaccine (04/21/2016) This information is not intended to replace advice given to you by your health care provider. Make sure you discuss any questions you have with your health care provider. Document Released: 05/06/2016 Document Revised: 09/30/2017 Document Reviewed: 09/30/2017 Elsevier Interactive Patient Education  2019 [employer].

## 2018-08-19 ENCOUNTER — Encounter: Payer: Self-pay | Admitting: Family Medicine

## 2018-08-19 MED ORDER — AMPHETAMINE-DEXTROAMPHETAMINE 10 MG PO TABS: ORAL_TABLET | ORAL | 0 refills | Status: DC

## 2018-08-19 MED ORDER — AMPHETAMINE-DEXTROAMPHET ER 30 MG PO CP24: 30.0000 mg | ORAL_CAPSULE | ORAL | 0 refills | Status: DC

## 2018-08-19 MED FILL — AMPHETAMINE-DEXTROAMPHETAMI: 10 | 90 days supply | Qty: 90 | Fill #0

## 2018-08-20 ENCOUNTER — Encounter: Payer: Self-pay | Admitting: Family Medicine

## 2018-08-20 LAB — COMPLETE METABOLIC PANEL WITH GFR
AG Ratio: 1.7 (calc) (ref 1.0–2.5)
ALT: 15 U/L (ref 6–29)
AST: 15 U/L (ref 10–35)
Albumin: 4.4 g/dL (ref 3.6–5.1)
Alkaline phosphatase (APISO): 80 U/L (ref 37–153)
BUN/Creatinine Ratio: 36 (calc) — ABNORMAL HIGH (ref 6–22)
BUN: 28 mg/dL — ABNORMAL HIGH (ref 7–25)
CO2: 27 mmol/L (ref 20–32)
Calcium: 9.2 mg/dL (ref 8.6–10.4)
Chloride: 103 mmol/L (ref 98–110)
Creat: 0.78 mg/dL (ref 0.50–1.05)
GFR, Est African American: 101 mL/min/{1.73_m2} (ref >=60)
GFR, Est Non African American: 87 mL/min/{1.73_m2} (ref >=60)
Globulin: 2.6 g/dL (calc) (ref 1.9–3.7)
Glucose, Bld: 127 mg/dL — ABNORMAL HIGH (ref 65–99)
Potassium: 4.9 mmol/L (ref 3.5–5.3)
Sodium: 136 mmol/L (ref 135–146)
Total Bilirubin: 0.3 mg/dL (ref 0.2–1.2)
Total Protein: 7 g/dL (ref 6.1–8.1)

## 2018-08-20 LAB — TSH: TSH: 1.29 mIU/L

## 2018-08-20 LAB — CBC
HCT: 41 % (ref 35.0–45.0)
Hemoglobin: 14 g/dL (ref 11.7–15.5)
MCH: 31.5 pg (ref 27.0–33.0)
MCHC: 34.1 g/dL (ref 32.0–36.0)
MCV: 92.3 fL (ref 80.0–100.0)
MPV: 10.9 fL (ref 7.5–12.5)
Platelets: 269 10*3/uL (ref 140–400)
RBC: 4.44 10*6/uL (ref 3.80–5.10)
RDW: 11.8 % (ref 11.0–15.0)
WBC: 6.4 10*3/uL (ref 3.8–10.8)

## 2018-08-20 LAB — HEMOGLOBIN A1C W/OUT EAG: Hgb A1c MFr Bld: 5.7 % of total Hgb — ABNORMAL HIGH (ref <5.7)

## 2018-08-20 LAB — LIPID PANEL
Cholesterol: 149 mg/dL (ref <200)
HDL: 58 mg/dL (ref >=50)
LDL Cholesterol (Calc): 72 mg/dL (calc)
Non-HDL Cholesterol (Calc): 91 mg/dL (calc) (ref <130)
Total CHOL/HDL Ratio: 2.6 (calc) (ref <5.0)
Triglycerides: 109 mg/dL (ref <150)

## 2018-08-20 NOTE — Assessment & Plan Note (Signed)
Deteriorated , needs to reduce carb intake Patient educated about the importance of limiting  Carbohydrate intake , the need to commit to daily physical activity for a minimum of 30 minutes , and to commit weight loss. The fact that changes in all these areas will reduce or eliminate all together the development of diabetes is stressed.   Diabetic Labs Latest Ref Rng & Units 08/17/2018 02/26/2018 12/25/2017 06/18/2017 02/10/2017  HbA1c <5.7 % of total Hgb 5.7(H) 5.6 - 6.1(H) 6.0(H)  Chol <200 mg/dL 149 142 - 138 149  HDL > OR = 50 mg/dL 58 49(L) - 46(L) 45(L)  Calc LDL mg/dL (calc) 72 77 - 69 81  Triglycerides <150 mg/dL 109 82 - 146 132  Creatinine 0.50 - 1.05 mg/dL 0.78 0.71 0.70 0.79 0.81   BP/Weight 08/18/2018 07/13/2018 03/24/2018 03/16/2018 02/09/2018 12/29/2017 9/79/8921  Systolic BP 194 174 081 448 185 631 497  Diastolic BP 78 73 78 72 78 80 78  Wt. (Lbs) 157 154 154.12 157 159.8 168.8 165  BMI 29.66 29.1 29.12 29.66 30.19 31.89 31.18   No flowsheet data found.

## 2018-08-20 NOTE — Assessment & Plan Note (Signed)
Improved with mid day short acting adderall, however states often "forgets" to take the medication, but reports that it DOES help her to function better, will commit to taking daily as prescribed, continue long acting dose as before

## 2018-08-20 NOTE — Assessment & Plan Note (Signed)
Controlled, no change in medication DASH diet and commitment to daily physical activity for a minimum of 30 minutes discussed and encouraged, as a part of hypertension management. The importance of attaining a healthy weight is also discussed.  BP/Weight 08/18/2018 07/13/2018 03/24/2018 03/16/2018 02/09/2018 12/29/2017 09/26/7216  Systolic BP 288 337 445 146 047 998 721  Diastolic BP 78 73 78 72 78 80 78  Wt. (Lbs) 157 154 154.12 157 159.8 168.8 165  BMI 29.66 29.1 29.12 29.66 30.19 31.89 31.18

## 2018-08-20 NOTE — Assessment & Plan Note (Signed)
Controlled, no change in medication  

## 2018-08-20 NOTE — Assessment & Plan Note (Signed)
  Patient re-educated about  the importance of commitment to a  minimum of 150 minutes of exercise per week as able.  The importance of healthy food choices with portion control discussed, as well as eating regularly and within a 12 hour window most days. The need to choose "clean , green" food 50 to 75% of the time is discussed, as well as to make water the primary drink and set a goal of 64 ounces water daily.    Weight /BMI 08/18/2018 07/13/2018 03/24/2018  WEIGHT 157 lb 154 lb 154 lb 1.9 oz  HEIGHT 5\' 1"  5\' 1"  5\' 1"   BMI 29.66 kg/m2 29.1 kg/m2 29.12 kg/m2

## 2018-08-20 NOTE — Progress Notes (Signed)
Carmen Mcintyre     MRN: [id number]      DOB: [birth year]   HPI Carmen Mcintyre is here for follow up and re-evaluation of chronic medical conditions, medication management and review of any available recent lab and radiology data.  Preventive health is updated, specifically  Cancer screening and Immunization.   Questions or concerns regarding consultations or procedures which the PT has had in the interim are  addressed. The PT denies any adverse reactions to current medications since the last visit.  There are no new concerns.  There are no specific complaints   ROS Denies recent fever or chills. Denies sinus pressure, nasal congestion, ear pain or sore throat. Denies chest congestion, productive cough or wheezing. Denies chest pains, palpitations and leg swelling Denies abdominal pain, nausea, vomiting,diarrhea or constipation.   Denies dysuria, frequency, hesitancy or incontinence. Denies joint pain, swelling and limitation in mobility. Denies headaches, seizures, numbness, or tingling. Denies depression, anxiety or insomnia. Denies skin break down or rash.   PE  BP 118/78   Pulse 97   Temp 98.1 F (36.7 C) (Temporal)   Resp 15   Ht 5\' 1"  (1.549 m)   Wt 157 lb (71.2 kg)   LMP 08/22/2016   SpO2 97%   BMI 29.66 kg/m   Patient alert and oriented and in no cardiopulmonary distress.  HEENT: No facial asymmetry, EOMI,   oropharynx pink and moist.  Neck supple no JVD, no mass.  Chest: Clear to auscultation bilaterally.  CVS: S1, S2 no murmurs, no S3.Regular rate.  ABD: Soft non tender.   Ext: No edema  MS: Adequate ROM spine, shoulders, hips and knees.  Skin: Intact, no ulcerations or rash noted.  Psych: Good eye contact, normal affect. Memory intact not anxious or depressed appearing.  CNS: CN 2-12 intact, power,  normal throughout.no focal deficits noted.   Assessment & Plan  CHRONIC FATIGUE SYNDROME Improved with mid day short acting adderall, however states  often "forgets" to take the medication, but reports that it DOES help her to function better, will commit to taking daily as prescribed, continue long acting dose as before  HTN (hypertension) Controlled, no change in medication DASH diet and commitment to daily physical activity for a minimum of 30 minutes discussed and encouraged, as a part of hypertension management. The importance of attaining a healthy weight is also discussed.  BP/Weight 08/18/2018 07/13/2018 03/24/2018 03/16/2018 02/09/2018 12/29/2017 06/16/8117  Systolic BP 147 829 562 130 865 784 696  Diastolic BP 78 73 78 72 78 80 78  Wt. (Lbs) 157 154 154.12 157 159.8 168.8 165  BMI 29.66 29.1 29.12 29.66 30.19 31.89 31.18       Hyperlipidemia LDL goal <70 Hyperlipidemia:Low fat diet discussed and encouraged.   Lipid Panel  Lab Results  Component Value Date   CHOL 149 08/17/2018   HDL 58 08/17/2018   LDLCALC 72 08/17/2018   TRIG 109 08/17/2018   CHOLHDL 2.6 08/17/2018   Controlled, no change in medication     Overweight (BMI 25.0-29.9)  Patient re-educated about  the importance of commitment to a  minimum of 150 minutes of exercise per week as able.  The importance of healthy food choices with portion control discussed, as well as eating regularly and within a 12 hour window most days. The need to choose "clean , green" food 50 to 75% of the time is discussed, as well as to make water the primary drink and set a goal of 64  ounces water daily.    Weight /BMI 08/18/2018 07/13/2018 03/24/2018  WEIGHT 157 lb 154 lb 154 lb 1.9 oz  HEIGHT 5\' 1"  5\' 1"  5\' 1"   BMI 29.66 kg/m2 29.1 kg/m2 29.12 kg/m2      Prediabetes Deteriorated , needs to reduce carb intake Patient educated about the importance of limiting  Carbohydrate intake , the need to commit to daily physical activity for a minimum of 30 minutes , and to commit weight loss. The fact that changes in all these areas will reduce or eliminate all together the development  of diabetes is stressed.   Diabetic Labs Latest Ref Rng & Units 08/17/2018 02/26/2018 12/25/2017 06/18/2017 02/10/2017  HbA1c <5.7 % of total Hgb 5.7(H) 5.6 - 6.1(H) 6.0(H)  Chol <200 mg/dL 149 142 - 138 149  HDL > OR = 50 mg/dL 58 49(L) - 46(L) 45(L)  Calc LDL mg/dL (calc) 72 77 - 69 81  Triglycerides <150 mg/dL 109 82 - 146 132  Creatinine 0.50 - 1.05 mg/dL 0.78 0.71 0.70 0.79 0.81   BP/Weight 08/18/2018 07/13/2018 03/24/2018 03/16/2018 02/09/2018 12/29/2017 05/18/2160  Systolic BP 446 950 722 575 051 833 582  Diastolic BP 78 73 78 72 78 80 78  Wt. (Lbs) 157 154 154.12 157 159.8 168.8 165  BMI 29.66 29.1 29.12 29.66 30.19 31.89 31.18   No flowsheet data found.    Seasonal allergies Controlled, no change in medication   Depression with anxiety Controlled, no change in medication

## 2018-08-20 NOTE — Assessment & Plan Note (Signed)
Hyperlipidemia:Low fat diet discussed and encouraged.   Lipid Panel  Lab Results  Component Value Date   CHOL 149 08/17/2018   HDL 58 08/17/2018   LDLCALC 72 08/17/2018   TRIG 109 08/17/2018   CHOLHDL 2.6 08/17/2018   Controlled, no change in medication

## 2018-08-30 ENCOUNTER — Ambulatory Visit: Payer: 59 | Admitting: Family Medicine

## 2018-09-08 ENCOUNTER — Encounter: Payer: Self-pay | Admitting: Family Medicine

## 2018-09-08 ENCOUNTER — Other Ambulatory Visit: Payer: Self-pay | Admitting: Family Medicine

## 2018-09-08 ENCOUNTER — Telehealth: Payer: Self-pay | Admitting: *Deleted

## 2018-09-08 ENCOUNTER — Other Ambulatory Visit: Payer: 59 | Admitting: Advanced Practice Midwife

## 2018-09-08 MED ORDER — AMPHETAMINE-DEXTROAMPHET ER 30 MG PO CP24: 30.0000 mg | ORAL_CAPSULE | ORAL | 0 refills | Status: DC

## 2018-09-08 MED FILL — ADDERALL XR 30 MG CAP SA: 30 | 90 days supply | Qty: 90 | Fill #0

## 2018-09-08 NOTE — Telephone Encounter (Signed)
medsent

## 2018-09-08 NOTE — Telephone Encounter (Signed)
Pt called needing a refill on her adderall 30 mg sent to the [facility] long outpatient pharmacy.

## 2018-09-14 MED FILL — VENLAFAXINE HCL ER 150 MG C: 150 | 90 days supply | Qty: 90 | Fill #3

## 2018-09-15 MED FILL — ATORVASTATIN 20 MG TABLET: 20 | 90 days supply | Qty: 90 | Fill #1

## 2018-09-24 MED FILL — LISINOPRIL 10 MG TABLET: 10 | 90 days supply | Qty: 90 | Fill #1

## 2018-11-19 MED FILL — AMPHETAMINE-DEXTROAMPHETAMI: 10 | 90 days supply | Qty: 90 | Fill #0

## 2018-12-06 ENCOUNTER — Other Ambulatory Visit: Payer: Self-pay | Admitting: Family Medicine

## 2018-12-06 ENCOUNTER — Encounter: Payer: Self-pay | Admitting: Family Medicine

## 2018-12-06 MED ORDER — AMPHETAMINE-DEXTROAMPHET ER 30 MG PO CP24: 30.0000 mg | ORAL_CAPSULE | ORAL | 0 refills | Status: DC

## 2018-12-06 MED FILL — ADDERALL XR 30 MG CAP SA: 30 | 90 days supply | Qty: 90 | Fill #0

## 2018-12-06 NOTE — Progress Notes (Signed)
Refill request sent

## 2018-12-13 ENCOUNTER — Other Ambulatory Visit: Payer: Self-pay | Admitting: Family Medicine

## 2018-12-13 MED FILL — VENLAFAXINE HCL ER 150 MG C: 150 | 90 days supply | Qty: 90 | Fill #0

## 2018-12-14 ENCOUNTER — Other Ambulatory Visit: Payer: Self-pay | Admitting: Family Medicine

## 2018-12-14 DIAGNOSIS — R232 Flushing: Secondary | ICD-10-CM

## 2018-12-21 DIAGNOSIS — H524 Presbyopia: Secondary | ICD-10-CM | POA: Diagnosis not present

## 2019-01-06 ENCOUNTER — Other Ambulatory Visit: Payer: Self-pay | Admitting: Family Medicine

## 2019-01-06 DIAGNOSIS — Z1231 Encounter for screening mammogram for malignant neoplasm of breast: Secondary | ICD-10-CM

## 2019-01-07 ENCOUNTER — Other Ambulatory Visit: Payer: Self-pay | Admitting: Family Medicine

## 2019-01-07 DIAGNOSIS — R7303 Prediabetes: Secondary | ICD-10-CM

## 2019-01-10 MED FILL — LISINOPRIL 10 MG TABS: 10 | 90 days supply | Qty: 90 | Fill #0

## 2019-01-31 ENCOUNTER — Telehealth: Payer: Self-pay | Admitting: *Deleted

## 2019-01-31 NOTE — Telephone Encounter (Signed)
Pt had flu shot on 10-30 at [facility]

## 2019-01-31 NOTE — Telephone Encounter (Signed)
Entered

## 2019-02-01 ENCOUNTER — Ambulatory Visit: Payer: 59 | Admitting: Family Medicine

## 2019-02-02 ENCOUNTER — Telehealth: Payer: Self-pay | Admitting: *Deleted

## 2019-02-02 NOTE — Telephone Encounter (Signed)

## 2019-02-08 ENCOUNTER — Encounter: Payer: Self-pay | Admitting: Family Medicine

## 2019-02-08 ENCOUNTER — Telehealth (INDEPENDENT_AMBULATORY_CARE_PROVIDER_SITE_OTHER): Payer: 59 | Admitting: Family Medicine

## 2019-02-08 ENCOUNTER — Other Ambulatory Visit: Payer: Self-pay

## 2019-02-08 VITALS — BP 127/82 | HR 90 | Ht 61.0 in | Wt 165.0 lb

## 2019-02-08 DIAGNOSIS — E559 Vitamin D deficiency, unspecified: Secondary | ICD-10-CM | POA: Diagnosis not present

## 2019-02-08 DIAGNOSIS — R7301 Impaired fasting glucose: Secondary | ICD-10-CM | POA: Diagnosis not present

## 2019-02-08 DIAGNOSIS — E785 Hyperlipidemia, unspecified: Secondary | ICD-10-CM

## 2019-02-08 DIAGNOSIS — I639 Cerebral infarction, unspecified: Secondary | ICD-10-CM

## 2019-02-08 DIAGNOSIS — I10 Essential (primary) hypertension: Secondary | ICD-10-CM | POA: Diagnosis not present

## 2019-02-08 DIAGNOSIS — G9332 Myalgic encephalomyelitis/chronic fatigue syndrome: Secondary | ICD-10-CM

## 2019-02-08 DIAGNOSIS — R7303 Prediabetes: Secondary | ICD-10-CM

## 2019-02-08 DIAGNOSIS — F418 Other specified anxiety disorders: Secondary | ICD-10-CM

## 2019-02-08 DIAGNOSIS — R5382 Chronic fatigue, unspecified: Secondary | ICD-10-CM | POA: Diagnosis not present

## 2019-02-08 NOTE — Assessment & Plan Note (Signed)
Controlled, no change in medication  

## 2019-02-08 NOTE — Assessment & Plan Note (Signed)
Patient educated about the importance of limiting  Carbohydrate intake , the need to commit to daily physical activity for a minimum of 30 minutes , and to commit weight loss. The fact that changes in all these areas will reduce or eliminate all together the development of diabetes is stressed.   Diabetic Labs Latest Ref Rng & Units 08/17/2018 02/26/2018 12/25/2017 06/18/2017 02/10/2017  HbA1c <5.7 % of total Hgb 5.7(H) 5.6 - 6.1(H) 6.0(H)  Chol <200 mg/dL 149 142 - 138 149  HDL > OR = 50 mg/dL 58 49(L) - 46(L) 45(L)  Calc LDL mg/dL (calc) 72 77 - 69 81  Triglycerides <150 mg/dL 109 82 - 146 132  Creatinine 0.50 - 1.05 mg/dL 0.78 0.71 0.70 0.79 0.81   BP/Weight 02/08/2019 08/18/2018 07/13/2018 03/24/2018 03/16/2018 02/09/2018 [id number]  Systolic BP [id number] 123456 123456 [id number] 123456 [id number]  Diastolic BP 82 78 73 78 72 78 80  Wt. (Lbs) 165 157 154 154.12 157 159.8 168.8  BMI 31.18 29.66 29.1 29.12 29.66 30.19 31.89   No flowsheet data found.

## 2019-02-08 NOTE — Assessment & Plan Note (Signed)
ApproprIATE FOLLOW UP  TO BE  DETERMINED, WILL REACH OUT TO NEUROLOGY

## 2019-02-08 NOTE — Patient Instructions (Signed)
F/u  with MD in 4 months, call if you need me before in office for shingrix # 2  Nurse visit in 2 days for shingrix #1 , this week Thursday  Fasting lipid, cmp and eGR , hBA1c, vit d,   I will contact dr Merlene Laughter re recommended f/u as far as neurology is concerned and be back in touch  It is important that you exercise regularly at least 30 minutes 5 times a week. If you develop chest pain, have severe difficulty breathing, or feel very tired, stop exercising immediately and seek medical attention    Think about what you will eat, plan ahead. Choose " clean, green, fresh or frozen" over canned, processed or packaged foods which are more sugary, salty and fatty. 70 to 75% of food eaten should be vegetables and fruit. Three meals at set times with snacks allowed between meals, but they must be fruit or vegetables. Aim to eat over a 12 hour period , example 7 am to 7 pm, and STOP after  your last meal of the day. Drink water,generally about 64 ounces per day, no other drink is as healthy. Fruit juice is best enjoyed in a healthy way, by EATING the fruit.   Thanks for choosing [facility], we consider it a privelige to serve you.

## 2019-02-08 NOTE — Assessment & Plan Note (Signed)
Controlled, no change in medication DASH diet and commitment to daily physical activity for a minimum of 30 minutes discussed and encouraged, as a part of hypertension management. The importance of attaining a healthy weight is also discussed.  BP/Weight 02/08/2019 08/18/2018 07/13/2018 03/24/2018 03/16/2018 02/09/2018 [id number]  Systolic BP [id number] 123456 123456 [id number] 123456 [id number]  Diastolic BP 82 78 73 78 72 78 80  Wt. (Lbs) 165 157 154 154.12 157 159.8 168.8  BMI 31.18 29.66 29.1 29.12 29.66 30.19 31.89

## 2019-02-08 NOTE — Progress Notes (Signed)
Virtual Visit via Telephone Note  I connected with Carmen Mcintyre on 02/08/19 at 11:00 AM EST by telephone and verified that I am speaking with the correct person using two identifiers.  Location: Patient: home Provider: office   I discussed the limitations, risks, security and privacy concerns of performing an evaluation and management service by telephone and the availability of in person appointments. I also discussed with the patient that there may be a patient responsible charge related to this service. The patient expressed understanding and agreed to proceed.   History of Present Illness: F/u chronic problems, update labs, and routine health maintainace. No concerns except weight re gain with less exercise and slight change in eating,will work on this Denies uncontrolled depression or anxiety, and reports good control of fatigue with current regime V Denies aNY neurologic symptoms in the past 1 year, specifically vision disturbance, weakness, numbness or syncope. Wants to know if imaging sudy alone can be done for follow up and skip Neurology appointment, will send message  Denies recent fever or chills. Denies sinus pressure, nasal congestion, ear pain or sore throat. Denies chest congestion, productive cough or wheezing. Denies chest pains, palpitations and leg swelling Denies abdominal pain, nausea, vomiting,diarrhea or constipation.   Denies dysuria, frequency, hesitancy or incontinence. Denies joint pain, swelling and limitation in mobility. Denies headaches, seizures, numbness, or tingling. Denies depression, anxiety or insomnia. Denies skin break down or rash.      Objective: BP 127/82   Pulse 90   Ht 5\' 1"  (1.549 m)   Wt 165 lb (74.8 kg)   LMP 08/22/2016   SpO2 97%   BMI 31.18 kg/m  Good communication with no confusion and intact memory. Alert and oriented x 3 No signs of respiratory distress during speech     Assessment and Plan: HTN  (hypertension) Controlled, no change in medication DASH diet and commitment to daily physical activity for a minimum of 30 minutes discussed and encouraged, as a part of hypertension management. The importance of attaining a healthy weight is also discussed.  BP/Weight 02/08/2019 08/18/2018 07/13/2018 03/24/2018 03/16/2018 02/09/2018 [id number]  Systolic BP [id number] [id number] 123456 [id number] 123456 [id number]  Diastolic BP 82 78 73 78 72 78 80  Wt. (Lbs) 165 157 154 154.12 157 159.8 168.8  BMI 31.18 29.66 29.1 29.12 29.66 30.19 31.89       Depression with anxiety Controlled, no change in medication   Chronic fatigue disorder Controlled, no change in medication   Prediabetes Patient educated about the importance of limiting  Carbohydrate intake , the need to commit to daily physical activity for a minimum of 30 minutes , and to commit weight loss. The fact that changes in all these areas will reduce or eliminate all together the development of diabetes is stressed.   Diabetic Labs Latest Ref Rng & Units 08/17/2018 02/26/2018 12/25/2017 06/18/2017 02/10/2017  HbA1c <5.7 % of total Hgb 5.7(H) 5.6 - 6.1(H) 6.0(H)  Chol <200 mg/dL 149 142 - 138 149  HDL > OR = 50 mg/dL 58 49(L) - 46(L) 45(L)  Calc LDL mg/dL (calc) 72 77 - 69 81  Triglycerides <150 mg/dL 109 82 - 146 132  Creatinine 0.50 - 1.05 mg/dL 0.78 0.71 0.70 0.79 0.81   BP/Weight 02/08/2019 08/18/2018 07/13/2018 03/24/2018 03/16/2018 02/09/2018 [id number]  Systolic BP [id number] 123456 123456 [id number] 123456 [id number]  Diastolic BP 82 78 73 78 72 78 80  Wt. (Lbs) 165 157 154 154.12 157 159.8 168.8  BMI 31.18 29.66  29.1 29.12 29.66 30.19 31.89   No flowsheet data found.    Stroke (cerebrum) (HCC) ApproprIATE FOLLOW UP  TO BE  DETERMINED, WILL REACH OUT TO NEUROLOGY   Follow Up Instructions:    I discussed the assessment and treatment plan with the patient. The patient was provided an opportunity to ask questions and all were answered. The patient agreed with the plan and  demonstrated an understanding of the instructions.   The patient was advised to call back or seek an in-person evaluation if the symptoms worsen or if the condition fails to improve as anticipated.  I provided 25 minutes of non-face-to-face time during this encounter.   Tula Nakayama, MD

## 2019-02-09 ENCOUNTER — Telehealth: Payer: 59 | Admitting: Physician Assistant

## 2019-02-09 ENCOUNTER — Telehealth: Payer: Self-pay | Admitting: Internal Medicine

## 2019-02-09 ENCOUNTER — Other Ambulatory Visit: Payer: Self-pay

## 2019-02-09 ENCOUNTER — Encounter: Payer: Self-pay | Admitting: Family Medicine

## 2019-02-09 NOTE — Telephone Encounter (Addendum)
Patient states she is off on Tuesday and Fridays. Appointment made for 12/11  Apologized to patient for appointment change   ----- Message from Rodman Key, RN sent at 02/09/2019  9:46 AM EST ----- Sharin Grave can use either of the 2 open spots for Dr. Harrington Challenger on Monday to add Ms. Mensching.  Thanks! Michalene ----- Message ----- From: Laurier Nancy Sent: 02/09/2019   9:26 AM EST To: Rodman Key, RN, Leanor Kail, PA, #   Vin, do you want the patient to remain on your schedule for today? If not, Michalene what day can patient be added? Can the 48hr NP slot for  Dr Harrington Challenger next week be used (or sooner)since this is last minute?  Alwyn Ren  ----- Message ----- From: Rodman Key, RN Sent: 02/08/2019   5:06 PM EST To: Leanor Kail, PA, [name], [place], #  The pt was scheduled in March with Dr. Harrington Challenger as a new pt and was never seen.  Due to covid it was cancelled.   ----- Message ----- From: Laurier Nancy Sent: 02/08/2019   4:01 PM EST To: Rodman Key, RN, Leanor Kail, PA  Sherman, Did Dr Harrington Challenger mention anything about this patient to you?  ----- Message ----- From: Leanor Kail, PA Sent: 02/08/2019   2:27 PM EST To: [name], RMA, [place]  She is on my schedule tomorrow.  I do not see any prior cardiology note except telemetry for encounter with Dr. Harrington Challenger earlier this year.  She needs new patient evaluation MD with EKG.  I am happy to see her as well.

## 2019-02-10 ENCOUNTER — Ambulatory Visit (INDEPENDENT_AMBULATORY_CARE_PROVIDER_SITE_OTHER): Payer: 59 | Admitting: Adult Health

## 2019-02-10 ENCOUNTER — Other Ambulatory Visit: Payer: Self-pay

## 2019-02-10 ENCOUNTER — Encounter: Payer: Self-pay | Admitting: Adult Health

## 2019-02-10 ENCOUNTER — Ambulatory Visit (INDEPENDENT_AMBULATORY_CARE_PROVIDER_SITE_OTHER): Payer: 59

## 2019-02-10 ENCOUNTER — Other Ambulatory Visit (HOSPITAL_COMMUNITY)
Admission: RE | Admit: 2019-02-10 | Discharge: 2019-02-10 | Disposition: A | Discharge status: Home / Self Care | Payer: 59 | Source: Ambulatory Visit | Attending: Adult Health | Admitting: Adult Health

## 2019-02-10 VITALS — BP 114/74 | HR 99 | Ht 61.0 in | Wt 168.8 lb

## 2019-02-10 DIAGNOSIS — Z1211 Encounter for screening for malignant neoplasm of colon: Secondary | ICD-10-CM

## 2019-02-10 DIAGNOSIS — I10 Essential (primary) hypertension: Secondary | ICD-10-CM | POA: Diagnosis not present

## 2019-02-10 DIAGNOSIS — Z01419 Encounter for gynecological examination (general) (routine) without abnormal findings: Secondary | ICD-10-CM | POA: Diagnosis not present

## 2019-02-10 DIAGNOSIS — N951 Menopausal and female climacteric states: Secondary | ICD-10-CM | POA: Insufficient documentation

## 2019-02-10 DIAGNOSIS — Z23 Encounter for immunization: Secondary | ICD-10-CM | POA: Diagnosis not present

## 2019-02-10 DIAGNOSIS — Z1212 Encounter for screening for malignant neoplasm of rectum: Secondary | ICD-10-CM

## 2019-02-10 DIAGNOSIS — Z8673 Personal history of transient ischemic attack (TIA), and cerebral infarction without residual deficits: Secondary | ICD-10-CM | POA: Insufficient documentation

## 2019-02-10 DIAGNOSIS — Z9189 Other specified personal risk factors, not elsewhere classified: Secondary | ICD-10-CM

## 2019-02-10 DIAGNOSIS — E785 Hyperlipidemia, unspecified: Secondary | ICD-10-CM | POA: Diagnosis not present

## 2019-02-10 DIAGNOSIS — E559 Vitamin D deficiency, unspecified: Secondary | ICD-10-CM | POA: Diagnosis not present

## 2019-02-10 DIAGNOSIS — R7301 Impaired fasting glucose: Secondary | ICD-10-CM | POA: Diagnosis not present

## 2019-02-10 LAB — HEMOCCULT GUIAC POC 1CARD (OFFICE): Fecal Occult Blood, POC: NEGATIVE

## 2019-02-10 NOTE — Progress Notes (Signed)
Patient ID: Carmen Mcintyre, female   DOB: 11/17/1965, 53 y.o.   MRN: [id number] History of Present Illness: Carmen Mcintyre is a 53 year old white female, married,PM, in for a well woman gyn exam and pap.She is complaining of vaginal dryness and OTC creams have not worked. She is triage nurse for primary Carmen Mcintyre. She sees Dr Carmen Mcintyre for stroke follow up .   PCP is Dr Carmen Mcintyre.    Current Medications, Allergies, Past Medical History, Past Surgical History, Family History and Social History were reviewed in [employer] record.     Review of Systems: Patient denies any headaches, hearing loss, fatigue, blurred vision, shortness of breath, chest pain, abdominal pain, problems with bowel movements, urination, or intercourse(it hurts at times due to dryness). No joint pain or mood swings. +vaginal dryness,has tried Replens and astorglide with sex Had stroke 2016 was on estrogen patch and provera at the time has stopped   Physical Exam:Blood pressure 114/74, pulse 99, height 5\' 1"  (1.549 m), weight 168 lb 12.8 oz (76.6 kg), last menstrual period 08/22/2016.  General:  Well developed, well nourished, no acute distress Skin:  Warm and dry Neck:  Midline trachea, normal thyroid, good ROM, no lymphadenopathy Lungs; Clear to auscultation bilaterally Breast:  No dominant palpable mass, retraction, or nipple discharge Cardiovascular: Regular rate and rhythm Abdomen:  Soft, non tender, no hepatosplenomegaly Pelvic:  External genitalia is normal in appearance, no lesions.  The vagina is has some loss of moisture and rugae. Urethra has no lesions or masses. The cervix is smooth, pap with hight risk HPV 16/18 genotyping performed.  Uterus is felt to be normal size, shape, and contour.  No adnexal masses or tenderness noted.Bladder is non tender, no masses felt. Rectal: Good sphincter tone, no polyps, or hemorrhoids felt.  Hemoccult negative. Extremities/musculoskeletal:  No swelling  or varicosities noted, no clubbing or cyanosis Psych:  No mood changes, alert and cooperative,seems happy Fall risk is low PHQ 2 score is 0 Co exam with Carmen Croon FNP student.   Impression and Plan:  1. Encounter for gynecological examination with Papanicolaou smear of cervix Pap sent Pap in 2023 Can get physical with PCP Labs with PCP Mammogram 12/18 get yearly  2. Screening for colorectal cancer Coloscopy per GI  3. Vaginal dryness, menopausal Try Luvena every 72 hours  and continue with lubricate with sex Explained with her history of stroke that vaginal estrogen not a first choice, there are risks. Increase frequency of sex    4. History of stroke Follow up with Dr Carmen Mcintyre in near future, can discuss with him about vaginal estrogen if she chooses

## 2019-02-10 NOTE — Progress Notes (Signed)
In to get first shingrix vaccine

## 2019-02-11 ENCOUNTER — Encounter: Payer: Self-pay | Admitting: Family Medicine

## 2019-02-11 ENCOUNTER — Other Ambulatory Visit: Payer: Self-pay | Admitting: Family Medicine

## 2019-02-11 LAB — COMPLETE METABOLIC PANEL WITH GFR
AG Ratio: 2 (calc) (ref 1.0–2.5)
ALT: 22 U/L (ref 6–29)
AST: 18 U/L (ref 10–35)
Albumin: 4.5 g/dL (ref 3.6–5.1)
Alkaline phosphatase (APISO): 71 U/L (ref 37–153)
BUN/Creatinine Ratio: 36 (calc) — ABNORMAL HIGH (ref 6–22)
BUN: 26 mg/dL — ABNORMAL HIGH (ref 7–25)
CO2: 26 mmol/L (ref 20–32)
Calcium: 9.6 mg/dL (ref 8.6–10.4)
Chloride: 102 mmol/L (ref 98–110)
Creat: 0.73 mg/dL (ref 0.50–1.05)
GFR, Est African American: 108 mL/min/{1.73_m2} (ref >=60)
GFR, Est Non African American: 93 mL/min/{1.73_m2} (ref >=60)
Globulin: 2.3 g/dL (calc) (ref 1.9–3.7)
Glucose, Bld: 131 mg/dL — ABNORMAL HIGH (ref 65–99)
Potassium: 4.6 mmol/L (ref 3.5–5.3)
Sodium: 139 mmol/L (ref 135–146)
Total Bilirubin: 0.6 mg/dL (ref 0.2–1.2)
Total Protein: 6.8 g/dL (ref 6.1–8.1)

## 2019-02-11 LAB — LIPID PANEL
Cholesterol: 175 mg/dL (ref <200)
HDL: 53 mg/dL (ref >=50)
LDL Cholesterol (Calc): 97 mg/dL (calc)
Non-HDL Cholesterol (Calc): 122 mg/dL (calc) (ref <130)
Total CHOL/HDL Ratio: 3.3 (calc) (ref <5.0)
Triglycerides: 153 mg/dL — ABNORMAL HIGH (ref <150)

## 2019-02-11 LAB — SAR COV2 SEROLOGY (COVID19)AB(IGG),IA: SARS CoV2 AB IGG: NEGATIVE

## 2019-02-11 LAB — HEMOGLOBIN A1C
Hgb A1c MFr Bld: 6 % of total Hgb — ABNORMAL HIGH (ref <5.7)
Mean Plasma Glucose: 126 (calc)
eAG (mmol/L): 7 (calc)

## 2019-02-11 LAB — VITAMIN D 25 HYDROXY (VIT D DEFICIENCY, FRACTURES): Vit D, 25-Hydroxy: 18 ng/mL — ABNORMAL LOW (ref 30–100)

## 2019-02-11 MED ORDER — ERGOCALCIFEROL 1.25 MG (50000 UT) PO CAPS: 50000.0000 [IU] | ORAL_CAPSULE | ORAL | 1 refills | Status: DC

## 2019-02-11 MED FILL — VIT D2 1.25 MG (50,000 UNIT: 1.25 MG | 84 days supply | Qty: 12 | Fill #0

## 2019-02-11 NOTE — Progress Notes (Signed)
Vit s

## 2019-02-14 LAB — CYTOLOGY - PAP
Comment: NEGATIVE
Diagnosis: NEGATIVE
High risk HPV: NEGATIVE

## 2019-02-18 ENCOUNTER — Ambulatory Visit (INDEPENDENT_AMBULATORY_CARE_PROVIDER_SITE_OTHER): Payer: 59 | Admitting: Internal Medicine

## 2019-02-18 ENCOUNTER — Encounter: Payer: Self-pay | Admitting: *Deleted

## 2019-02-18 ENCOUNTER — Encounter: Payer: Self-pay | Admitting: Internal Medicine

## 2019-02-18 ENCOUNTER — Other Ambulatory Visit: Payer: Self-pay

## 2019-02-18 VITALS — BP 134/80 | HR 103 | Ht 61.0 in | Wt 168.8 lb

## 2019-02-18 DIAGNOSIS — I639 Cerebral infarction, unspecified: Secondary | ICD-10-CM

## 2019-02-18 NOTE — H&P (View-Only) (Signed)
Cardiology Office Note   Date:  02/18/2019   ID:  Carmen Mcintyre, DOB [birth year], MRN [id number]  PCP:  Carmen Helper, MD  Cardiologist:   Carmen Carnes, MD    Pt referred by Carmen Mcintyre  for TEE History of Present Illness: Carmen Mcintyre is a 53 y.o. 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   Breathing is OK   Denies CP    Current Meds  Medication Sig  . amphetamine-dextroamphetamine (ADDERALL XR) 30 MG 24 hr capsule Take 1 capsule (30 mg total) by mouth every morning.  Marland Kitchen amphetamine-dextroamphetamine (ADDERALL) 10 MG tablet Take 10 mg by mouth daily with breakfast.  . aspirin 81 MG tablet Take 1 tablet (81 mg total) by mouth daily.  Marland Kitchen atorvastatin (LIPITOR) 20 MG tablet TAKE 1 TABLET BY MOUTH ONCE DAILY  . calcium-vitamin D 250-100 MG-UNIT tablet Take 1 tablet by mouth 2 (two) times daily.  . ergocalciferol (VITAMIN D2) 1.25 MG (50000 UT) capsule Take 1 capsule (50,000 Units total) by mouth once a week. One capsule once weekly  . lisinopril (ZESTRIL) 10 MG tablet TAKE 1 TABLET BY MOUTH DAILY.  Marland Kitchen venlafaxine XR (EFFEXOR-XR) 150 MG 24 hr capsule TAKE 1 CAPSULE BY MOUTH DAILY WITH BREAKFAST.  . [DISCONTINUED] amphetamine-dextroamphetamine (ADDERALL) 10 MG tablet Take one tablet once daily at lunchtime,  By mouth   Current Facility-Administered Medications for the 02/18/19 encounter (Office Visit) with Fay Records, MD  Medication  . 0.9 %  sodium chloride  infusion     Allergies:   Hydrocodone   Past Medical History:  Diagnosis Date  . Allergy   . Anxiety   . Depression   . Dyslipidemia   . Hyperlipidemia   . Hypertension   . Insomnia   . Stroke ([place])   . Urinary incontinence     Past Surgical History:  Procedure Laterality Date  . BREAST BIOPSY Left    benign  . CAESAREAN  1997/2003  . CHOLECYSTECTOMY  2005  . CHOLECYSTECTOMY    . LEFT BREAST MASS REMOVED  1998  . RT LOWER LOBECTOMY SECONDARY IN INFECTION  2006     Social History:  The patient  reports that she has never smoked. She has never used smokeless tobacco. She reports current alcohol use. She reports that she does not use drugs.   Family History:  The patient's family history includes Arthritis in an other family member; Breast cancer in her maternal aunt; Cancer in her paternal grandmother; Diabetes in her mother.    ROS:  Please see the history of present illness. All other systems are reviewed and  Negative to the above problem except as noted.    PHYSICAL EXAM: VS:  BP 134/80   Pulse (!) 103   Ht 5\' 1"  (1.549 m)   Wt 168 lb 12.8 oz (  76.6 kg)   LMP 08/22/2016   BMI 31.89 kg/m   GEN: Obese 53 yo , in no acute distress  HEENT: normal  Neck: no JVD, carotid bruits, or masses Cardiac: RRR; no murmurs, rubs, or gallops,no edema  Respiratory:  clear to auscultation bilaterally, normal work of breathing GI: soft, nontender, nondistended, + BS  No hepatomegaly  MS: no deformity Moving all extremities   Skin: warm and dry, no rash Neuro:  Strength and sensation are intact Psych: euthymic mood, full affect   EKG:  EKG is ordered today.  ST 103 bpm   NonspecificST changes     Lipid Panel    Component Value Date/Time   CHOL 175 02/10/2019 0811   TRIG 153 (H) 02/10/2019 0811   HDL 53 02/10/2019 0811   CHOLHDL 3.3 02/10/2019 0811   VLDL 18 10/31/2016 0602   LDLCALC 97 02/10/2019 0811      Wt Readings from Last 3 Encounters:  02/18/19 168 lb  12.8 oz (76.6 kg)  02/10/19 168 lb 12.8 oz (76.6 kg)  02/08/19 165 lb (74.8 kg)      ASSESSMENT AND PLAN:  1  CVA  The pt is followed by Dr Almyra Free has not completed neurologic eval for event  Needs TEE to rule out cardiac source of embolism   Discussed risk/benefit   Pt understands and agrees to proceed    2  HTN  BP is OK   FOllow  3  HL   Currentluy on lipitor  LAst lpids were 84   Was better in June   WIll need to review  4   CV dz   Mild plaquing in 2018   WIl need to be followed   Current medicines are reviewed at length with the patient today.  The patient does not have concerns regarding medicines.  Signed, Carmen Carnes, MD  02/18/2019 3:19 PM    [address], [place], [place]  [address] Phone: [phone]; Fax: [phone]

## 2019-02-18 NOTE — Patient Instructions (Signed)
Medication Instructions:  No changes *If you need a refill on your cardiac medications before your next appointment, please call your pharmacy*  Lab Work: None today If you have labs (blood work) drawn today and your tests are completely normal, you will receive your results only by: Marland Kitchen MyChart Message (if you have MyChart) OR . A paper copy in the mail If you have any lab test that is abnormal or we need to change your treatment, we will call you to review the results.  Testing/Procedures: Your physician has requested that you have a TEE. During a TEE, sound waves are used to create images of your heart. It provides your doctor with information about the size and shape of your heart and how well your heart's chambers and valves are working. In this test, a transducer is attached to the end of a flexible tube that's guided down your throat and into your esophagus (the tube leading from you mouth to your stomach) to get a more detailed image of your heart. You are not awake for the procedure. Please see the instruction sheet given to you today. For further information please visit [email or web address].  You will need to have COVID screening prior to your procedure. Once the screening is complete, you will need to go home and isolate until you come to the hospital.  Follow-Up: Will plan follow up after procedure  Other Instructions none

## 2019-02-18 NOTE — Progress Notes (Signed)
Cardiology Office Note   Date:  02/18/2019   ID:  [name], DOB 1965/10/13, MRN [id number]  PCP:  Fayrene Helper, MD  Cardiologist:   Dorris Carnes, MD    Pt referred by Alisa Graff  for TEE History of Present Illness: Carmen Mcintyre is a 53 y.o. 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   Breathing is OK   Denies CP    Current Meds  Medication Sig  . amphetamine-dextroamphetamine (ADDERALL XR) 30 MG 24 hr capsule Take 1 capsule (30 mg total) by mouth every morning.  Marland Kitchen amphetamine-dextroamphetamine (ADDERALL) 10 MG tablet Take 10 mg by mouth daily with breakfast.  . aspirin 81 MG tablet Take 1 tablet (81 mg total) by mouth daily.  Marland Kitchen atorvastatin (LIPITOR) 20 MG tablet TAKE 1 TABLET BY MOUTH ONCE DAILY  . calcium-vitamin D 250-100 MG-UNIT tablet Take 1 tablet by mouth 2 (two) times daily.  . ergocalciferol (VITAMIN D2) 1.25 MG (50000 UT) capsule Take 1 capsule (50,000 Units total) by mouth once a week. One capsule once weekly  . lisinopril (ZESTRIL) 10 MG tablet TAKE 1 TABLET BY MOUTH DAILY.  Marland Kitchen venlafaxine XR (EFFEXOR-XR) 150 MG 24 hr capsule TAKE 1 CAPSULE BY MOUTH DAILY WITH BREAKFAST.  . [DISCONTINUED] amphetamine-dextroamphetamine (ADDERALL) 10 MG tablet Take one tablet once daily at lunchtime,  By mouth   Current Facility-Administered Medications for the 02/18/19 encounter (Office Visit) with Fay Records, MD  Medication  . 0.9 %  sodium chloride  infusion     Allergies:   Hydrocodone   Past Medical History:  Diagnosis Date  . Allergy   . Anxiety   . Depression   . Dyslipidemia   . Hyperlipidemia   . Hypertension   . Insomnia   . Stroke ([place])   . Urinary incontinence     Past Surgical History:  Procedure Laterality Date  . BREAST BIOPSY Left    benign  . CAESAREAN  1997/2003  . CHOLECYSTECTOMY  2005  . CHOLECYSTECTOMY    . LEFT BREAST MASS REMOVED  1998  . RT LOWER LOBECTOMY SECONDARY IN INFECTION  2006     Social History:  The patient  reports that she has never smoked. She has never used smokeless tobacco. She reports current alcohol use. She reports that she does not use drugs.   Family History:  The patient's family history includes Arthritis in an other family member; Breast cancer in her maternal aunt; Cancer in her paternal grandmother; Diabetes in her mother.    ROS:  Please see the history of present illness. All other systems are reviewed and  Negative to the above problem except as noted.    PHYSICAL EXAM: VS:  BP 134/80   Pulse (!) 103   Ht 5\' 1"  (1.549 m)   Wt 168 lb 12.8 oz (  76.6 kg)   LMP 08/22/2016   BMI 31.89 kg/m   GEN: Obese 53 yo , in no acute distress  HEENT: normal  Neck: no JVD, carotid bruits, or masses Cardiac: RRR; no murmurs, rubs, or gallops,no edema  Respiratory:  clear to auscultation bilaterally, normal work of breathing GI: soft, nontender, nondistended, + BS  No hepatomegaly  MS: no deformity Moving all extremities   Skin: warm and dry, no rash Neuro:  Strength and sensation are intact Psych: euthymic mood, full affect   EKG:  EKG is ordered today.  ST 103 bpm   NonspecificST changes     Lipid Panel    Component Value Date/Time   CHOL 175 02/10/2019 0811   TRIG 153 (H) 02/10/2019 0811   HDL 53 02/10/2019 0811   CHOLHDL 3.3 02/10/2019 0811   VLDL 18 10/31/2016 0602   LDLCALC 97 02/10/2019 0811      Wt Readings from Last 3 Encounters:  02/18/19 168 lb  12.8 oz (76.6 kg)  02/10/19 168 lb 12.8 oz (76.6 kg)  02/08/19 165 lb (74.8 kg)      ASSESSMENT AND PLAN:  1  CVA  The pt is followed by Dr Almyra Free has not completed neurologic eval for event  Needs TEE to rule out cardiac source of embolism   Discussed risk/benefit   Pt understands and agrees to proceed    2  HTN  BP is OK   FOllow  3  HL   Currentluy on lipitor  LAst lpids were 54   Was better in June   WIll need to review  4   CV dz   Mild plaquing in 2018   WIl need to be followed   Current medicines are reviewed at length with the patient today.  The patient does not have concerns regarding medicines.  Signed, Dorris Carnes, MD  02/18/2019 3:19 PM    [address], [place], [place]  [address] Phone: [phone]; Fax: [phone]

## 2019-02-22 ENCOUNTER — Other Ambulatory Visit: Payer: Self-pay

## 2019-02-22 ENCOUNTER — Other Ambulatory Visit (HOSPITAL_COMMUNITY)
Admission: RE | Admit: 2019-02-22 | Discharge: 2019-02-22 | Disposition: A | Discharge status: Home / Self Care | Payer: 59 | Source: Ambulatory Visit | Attending: Cardiovascular Disease | Admitting: Cardiovascular Disease

## 2019-02-22 ENCOUNTER — Other Ambulatory Visit (HOSPITAL_COMMUNITY)
Admission: RE | Admit: 2019-02-22 | Discharge: 2019-02-22 | Disposition: A | Discharge status: Home / Self Care | Payer: 59 | Source: Ambulatory Visit | Attending: Internal Medicine | Admitting: Internal Medicine

## 2019-02-22 ENCOUNTER — Telehealth: Payer: Self-pay | Admitting: *Deleted

## 2019-02-22 ENCOUNTER — Encounter: Payer: Self-pay | Admitting: *Deleted

## 2019-02-22 DIAGNOSIS — Z01812 Encounter for preprocedural laboratory examination: Secondary | ICD-10-CM | POA: Diagnosis not present

## 2019-02-22 DIAGNOSIS — Z20828 Contact with and (suspected) exposure to other viral communicable diseases: Secondary | ICD-10-CM | POA: Diagnosis not present

## 2019-02-22 LAB — BASIC METABOLIC PANEL
Anion gap: 7 (ref 5–15)
BUN: 18 mg/dL (ref 6–20)
CO2: 27 mmol/L (ref 22–32)
Calcium: 9.2 mg/dL (ref 8.9–10.3)
Chloride: 103 mmol/L (ref 98–111)
Creatinine, Ser: 0.72 mg/dL (ref 0.44–1.00)
GFR calc Af Amer: >60 mL/min (ref >60)
GFR calc non Af Amer: >60 mL/min (ref >60)
Glucose, Bld: 115 mg/dL — ABNORMAL HIGH (ref 70–99)
Potassium: 4.1 mmol/L (ref 3.5–5.1)
Sodium: 137 mmol/L (ref 135–145)

## 2019-02-22 NOTE — Telephone Encounter (Signed)
Spoke with patient and informed TEE scheduled for Friday 02/25/19 at [facility] at 11:30 am with Dr. Oval Linsey.  She will have BMET and Covid screening today at [facility].  All instructions have been sent to pt via MyChart as well.  Follow up to be planned after procedure.

## 2019-02-23 LAB — SARS CORONAVIRUS 2 (TAT 6-24 HRS): SARS Coronavirus 2: NEGATIVE

## 2019-02-24 ENCOUNTER — Telehealth: Payer: Self-pay

## 2019-02-24 DIAGNOSIS — E785 Hyperlipidemia, unspecified: Secondary | ICD-10-CM

## 2019-02-24 DIAGNOSIS — I10 Essential (primary) hypertension: Secondary | ICD-10-CM

## 2019-02-24 DIAGNOSIS — R7301 Impaired fasting glucose: Secondary | ICD-10-CM

## 2019-02-24 NOTE — Telephone Encounter (Signed)
-----  Message from Fayrene Helper, MD sent at 02/11/2019  8:49 AM EST ----- Regarding: lab order Pls order fasting lipid, cmp and eGFR and HBA1C for her NEXT visit, thanks

## 2019-02-25 ENCOUNTER — Ambulatory Visit (HOSPITAL_COMMUNITY)
Admission: RE | Admit: 2019-02-25 | Discharge: 2019-02-25 | Disposition: A | Discharge status: Home / Self Care | Payer: 59 | Attending: Cardiovascular Disease | Admitting: Cardiovascular Disease

## 2019-02-25 ENCOUNTER — Ambulatory Visit: Payer: 59

## 2019-02-25 ENCOUNTER — Encounter (HOSPITAL_COMMUNITY): Payer: Self-pay | Admitting: Cardiovascular Disease

## 2019-02-25 ENCOUNTER — Encounter (HOSPITAL_COMMUNITY)
Admission: RE | Disposition: A | Discharge status: Home / Self Care | Payer: Self-pay | Source: Home / Self Care | Attending: Cardiovascular Disease

## 2019-02-25 ENCOUNTER — Other Ambulatory Visit: Payer: Self-pay | Admitting: Cardiovascular Disease

## 2019-02-25 ENCOUNTER — Other Ambulatory Visit: Payer: Self-pay

## 2019-02-25 ENCOUNTER — Ambulatory Visit (HOSPITAL_BASED_OUTPATIENT_CLINIC_OR_DEPARTMENT_OTHER)
Admission: RE | Admit: 2019-02-25 | Discharge: 2019-02-25 | Disposition: A | Discharge status: Home / Self Care | Payer: 59 | Source: Home / Self Care | Attending: Cardiovascular Disease | Admitting: Cardiovascular Disease

## 2019-02-25 DIAGNOSIS — Z79899 Other long term (current) drug therapy: Secondary | ICD-10-CM | POA: Insufficient documentation

## 2019-02-25 DIAGNOSIS — F329 Major depressive disorder, single episode, unspecified: Secondary | ICD-10-CM | POA: Diagnosis not present

## 2019-02-25 DIAGNOSIS — Z7982 Long term (current) use of aspirin: Secondary | ICD-10-CM | POA: Diagnosis not present

## 2019-02-25 DIAGNOSIS — I639 Cerebral infarction, unspecified: Secondary | ICD-10-CM

## 2019-02-25 DIAGNOSIS — F419 Anxiety disorder, unspecified: Secondary | ICD-10-CM | POA: Insufficient documentation

## 2019-02-25 DIAGNOSIS — I6340 Cerebral infarction due to embolism of unspecified cerebral artery: Secondary | ICD-10-CM

## 2019-02-25 DIAGNOSIS — E785 Hyperlipidemia, unspecified: Secondary | ICD-10-CM | POA: Insufficient documentation

## 2019-02-25 DIAGNOSIS — I10 Essential (primary) hypertension: Secondary | ICD-10-CM | POA: Diagnosis not present

## 2019-02-25 HISTORY — PX: TEE WITHOUT CARDIOVERSION: SHX5443

## 2019-02-25 HISTORY — PX: BUBBLE STUDY: SHX6837

## 2019-02-25 SURGERY — ECHOCARDIOGRAM, TRANSESOPHAGEAL
Anesthesia: Moderate Sedation

## 2019-02-25 MED ORDER — BUTAMBEN-TETRACAINE-BENZOCAINE 2-2-14 % EX AERO
INHALATION_SPRAY | CUTANEOUS | Status: DC | PRN
  Administered 2019-02-25: 2 via TOPICAL

## 2019-02-25 MED ORDER — MIDAZOLAM HCL (PF) 5 MG/ML IJ SOLN
INTRAMUSCULAR | Status: AC
  Filled 2019-02-25: qty 2

## 2019-02-25 MED ORDER — FENTANYL CITRATE (PF) 100 MCG/2ML IJ SOLN
INTRAMUSCULAR | Status: DC | PRN
  Administered 2019-02-25 (×2): 25 ug via INTRAVENOUS

## 2019-02-25 MED ORDER — SODIUM CHLORIDE 0.9 % IV SOLN: INTRAVENOUS | Status: DC

## 2019-02-25 MED ORDER — MIDAZOLAM HCL 5 MG/5ML IJ SOLN
INTRAMUSCULAR | Status: DC | PRN
  Administered 2019-02-25 (×2): 2 mg via INTRAVENOUS
  Administered 2019-02-25: 1 mg via INTRAVENOUS
  Administered 2019-02-25: 2 mg via INTRAVENOUS

## 2019-02-25 MED ORDER — FENTANYL CITRATE (PF) 100 MCG/2ML IJ SOLN
INTRAMUSCULAR | Status: AC
  Filled 2019-02-25: qty 2

## 2019-02-25 NOTE — CV Procedure (Signed)
Brief TEE Note  LVEF 60-65% No LA/LAA thrombus or mass No ASD/PFO by color flow Doppler or saline microcavitation study  For additional details see full report.  During this procedure the patient is administered a total of Versed 7 mg and Fentanyl 50 mcg to achieve and maintain moderate conscious sedation.  The patient's heart rate, blood pressure, and oxygen saturation are monitored continuously during the procedure. The period of conscious sedation is 16 minutes, of which I was present face-to-face 100% of this time.  Jerardo Costabile C. Oval Linsey, MD, [facility] 02/25/2019 12:19 PM

## 2019-02-25 NOTE — Discharge Instructions (Signed)
Transesophageal Echocardiogram Transesophageal echocardiogram (TEE) is a test that uses sound waves to take pictures of your heart. TEE is done by passing a flexible tube down the esophagus. The esophagus is the tube that carries food from the throat to the stomach. The pictures give detailed images of your heart. This can help your doctor see if there are problems with your heart.  What happens during the procedure?  To lower your risk of infection, your doctors will wash or clean their hands.  An IV will be put into one of your veins.  You will be given a medicine to help you relax (sedative).  A medicine may be sprayed or gargled. This numbs the back of your throat.  Your blood pressure, heart rate, and breathing will be watched.  You may be asked to lay on your left side.  A bite block will be placed in your mouth. This keeps you from biting the tube.  The tip of the TEE probe will be placed into the back of your mouth.  You will be asked to swallow.  Your doctor will take pictures of your heart.  The probe and bite block will be taken out. The procedure may vary among doctors and hospitals.  What happens after the procedure?   Your blood pressure, heart rate, breathing rate, and blood oxygen level will be watched until the medicines you were given have worn off.  When you first wake up, your throat may feel sore and numb. This will get better over time. You will not be allowed to eat or drink until the numbness has gone away.  Do not drive for 24 hours if you were given a medicine to help you relax. Summary  TEE is a test that uses sound waves to take pictures of your heart.  You will be given a medicine to help you relax.  Do not drive for 24 hours if you were given a medicine to help you relax. This information is not intended to replace advice given to you by your health care provider. Make sure you discuss any questions you have with your health care  provider. Document Released: 12/22/2008 Document Revised: 11/13/2017 Document Reviewed: 05/28/2016 Elsevier Patient Education  2020 [employer].

## 2019-02-25 NOTE — Interval H&P Note (Signed)
History and Physical Interval Note:  02/25/2019 11:55 AM  Carmen Mcintyre  has presented today for surgery, with the diagnosis of CVA.  The various methods of treatment have been discussed with the patient and family. After consideration of risks, benefits and other options for treatment, the patient has consented to  Procedure(s): TRANSESOPHAGEAL ECHOCARDIOGRAM (TEE) (N/A) as a surgical intervention.  The patient's history has been reviewed, patient examined, no change in status, stable for surgery.  I have reviewed the patient's chart and labs.  Questions were answered to the patient's satisfaction.     Skeet Latch, MD

## 2019-02-25 NOTE — Progress Notes (Signed)
  Echocardiogram Echocardiogram Transesophageal has been performed.  Faizah Kandler A Terrionna Bridwell 02/25/2019, 12:41 PM

## 2019-03-02 ENCOUNTER — Encounter: Payer: Self-pay | Admitting: Family Medicine

## 2019-03-02 ENCOUNTER — Other Ambulatory Visit: Payer: Self-pay | Admitting: Family Medicine

## 2019-03-02 DIAGNOSIS — R05 Cough: Secondary | ICD-10-CM

## 2019-03-02 DIAGNOSIS — R059 Cough, unspecified: Secondary | ICD-10-CM

## 2019-03-02 MED ORDER — ALBUTEROL SULFATE HFA 108 (90 BASE) MCG/ACT IN AERS: 2.0000 | INHALATION_SPRAY | Freq: Four times a day (QID) | RESPIRATORY_TRACT | 0 refills | Status: DC | PRN

## 2019-03-02 NOTE — Telephone Encounter (Signed)
I have spoken with her and sent in the inhaler.

## 2019-03-02 NOTE — Progress Notes (Signed)
Patient called to report cough this morning and some chest tightness. No fever and no phlegm, started on mucinex and lots of water today. No other symptoms. No known exposure to COVID. Is going out of town.  Patient notified of prescription for albuterol inhaler and side effects. Advised to go to nearest ED if symptoms worse, fever develops or shortness of breath without relief of inhaler use.   Reviewed side effects, risks and benefits of medication.   Patient acknowledged agreement and understanding of the plan.    Perlie Mayo, DNP, AGNP-BC [facility]

## 2019-03-07 ENCOUNTER — Other Ambulatory Visit: Payer: Self-pay | Admitting: Family Medicine

## 2019-03-07 MED FILL — VENLAFAXINE HCL ER 150 MG C: 150 | 90 days supply | Qty: 90 | Fill #1

## 2019-03-08 MED FILL — ATORVASTATIN 20 MG TABLET: 20 | 90 days supply | Qty: 90 | Fill #1

## 2019-03-09 ENCOUNTER — Other Ambulatory Visit: Payer: Self-pay | Admitting: Family Medicine

## 2019-03-09 MED ORDER — AMPHETAMINE-DEXTROAMPHET ER 30 MG PO CP24: 30.0000 mg | ORAL_CAPSULE | Freq: Every day | ORAL | 0 refills | Status: DC

## 2019-03-09 MED FILL — ADDERALL XR 30 MG CAP SA: 30 | 90 days supply | Qty: 90 | Fill #0

## 2019-04-04 MED FILL — LISINOPRIL 10 MG TABS: 10 | 90 days supply | Qty: 90 | Fill #1

## 2019-04-15 ENCOUNTER — Ambulatory Visit: Payer: 59

## 2019-04-30 MED FILL — VIT D2 1.25 MG (50,000 UNIT: 1.25 MG | 84 days supply | Qty: 12 | Fill #1

## 2019-05-17 ENCOUNTER — Other Ambulatory Visit: Payer: Self-pay | Admitting: Family Medicine

## 2019-05-23 ENCOUNTER — Other Ambulatory Visit: Payer: Self-pay | Admitting: Family Medicine

## 2019-05-23 MED ORDER — AMPHETAMINE-DEXTROAMPHET ER 30 MG PO CP24: 30.0000 mg | ORAL_CAPSULE | ORAL | 0 refills | Status: DC

## 2019-05-23 MED ORDER — AMPHETAMINE-DEXTROAMPHETAMINE 10 MG PO TABS: 10.0000 mg | ORAL_TABLET | Freq: Every day | ORAL | 0 refills | Status: DC

## 2019-05-23 MED FILL — AMPHETAMINE-DEXTROAMPHETAMI: 10 | 90 days supply | Qty: 90 | Fill #0

## 2019-05-23 MED FILL — ADDERALL XR 30 MG CAP SA: 30 | 90 days supply | Qty: 90 | Fill #0

## 2019-06-06 ENCOUNTER — Other Ambulatory Visit: Payer: Self-pay | Admitting: Family Medicine

## 2019-06-06 ENCOUNTER — Other Ambulatory Visit: Payer: Self-pay | Admitting: *Deleted

## 2019-06-06 ENCOUNTER — Telehealth: Payer: Self-pay | Admitting: Family Medicine

## 2019-06-06 DIAGNOSIS — R232 Flushing: Secondary | ICD-10-CM

## 2019-06-06 MED ORDER — VENLAFAXINE HCL ER 150 MG PO CP24: ORAL_CAPSULE | ORAL | 1 refills | Status: DC

## 2019-06-06 MED ORDER — ATORVASTATIN CALCIUM 20 MG PO TABS: 20.0000 mg | ORAL_TABLET | Freq: Every day | ORAL | 1 refills | Status: DC

## 2019-06-06 MED FILL — VENLAFAXINE HCL ER 150 MG C: 150 | 90 days supply | Qty: 90 | Fill #0

## 2019-06-06 MED FILL — ATORVASTATIN 20 MG TABLET: 20 | 90 days supply | Qty: 90 | Fill #0

## 2019-06-06 NOTE — Telephone Encounter (Signed)
Pls call and verify that she has her aderral, Effexor and Lipitor refilled and that there are no problems with any of them , thanks. Let me know if an issue before lunchtime please

## 2019-06-06 NOTE — Telephone Encounter (Signed)
Already been sent can you refuse it it will not let me

## 2019-06-07 NOTE — Telephone Encounter (Signed)
Called patient and left message for them to return call at the office   

## 2019-06-08 NOTE — Telephone Encounter (Signed)
Pt received her meds

## 2019-06-09 ENCOUNTER — Ambulatory Visit (INDEPENDENT_AMBULATORY_CARE_PROVIDER_SITE_OTHER): Payer: 59

## 2019-06-09 ENCOUNTER — Ambulatory Visit: Payer: 59 | Admitting: Family Medicine

## 2019-06-09 ENCOUNTER — Other Ambulatory Visit: Payer: Self-pay

## 2019-06-09 DIAGNOSIS — Z23 Encounter for immunization: Secondary | ICD-10-CM | POA: Diagnosis not present

## 2019-07-04 ENCOUNTER — Other Ambulatory Visit: Payer: Self-pay | Admitting: Family Medicine

## 2019-07-04 DIAGNOSIS — R7303 Prediabetes: Secondary | ICD-10-CM

## 2019-07-04 MED FILL — LISINOPRIL 10 MG TABS: 10 | 90 days supply | Qty: 90 | Fill #0

## 2019-07-05 ENCOUNTER — Telehealth: Payer: Self-pay | Admitting: Family Medicine

## 2019-07-05 DIAGNOSIS — I10 Essential (primary) hypertension: Secondary | ICD-10-CM

## 2019-07-05 DIAGNOSIS — R7301 Impaired fasting glucose: Secondary | ICD-10-CM

## 2019-07-05 DIAGNOSIS — E785 Hyperlipidemia, unspecified: Secondary | ICD-10-CM

## 2019-07-05 NOTE — Telephone Encounter (Signed)
Pls contact pt. She has a fasting lab order from December. Please ask her to have her labs drawn the first week in June. Thanks!

## 2019-07-05 NOTE — Telephone Encounter (Signed)
Left message to have labs done as requested

## 2019-07-05 NOTE — Addendum Note (Signed)
Addended by: Eual Fines on: 07/05/2019 03:18 PM   Modules accepted: Orders

## 2019-07-23 ENCOUNTER — Other Ambulatory Visit: Payer: Self-pay | Admitting: Family Medicine

## 2019-07-25 ENCOUNTER — Other Ambulatory Visit: Payer: Self-pay | Admitting: *Deleted

## 2019-07-25 MED FILL — VIT D2 1.25 MG (50,000 UNIT: 1.25 MG | 84 days supply | Qty: 12 | Fill #0

## 2019-08-02 ENCOUNTER — Ambulatory Visit: Payer: 59 | Admitting: Family Medicine

## 2019-08-11 DIAGNOSIS — E785 Hyperlipidemia, unspecified: Secondary | ICD-10-CM | POA: Diagnosis not present

## 2019-08-11 DIAGNOSIS — R7301 Impaired fasting glucose: Secondary | ICD-10-CM | POA: Diagnosis not present

## 2019-08-11 DIAGNOSIS — I10 Essential (primary) hypertension: Secondary | ICD-10-CM | POA: Diagnosis not present

## 2019-08-12 ENCOUNTER — Encounter: Payer: Self-pay | Admitting: Family Medicine

## 2019-08-12 LAB — COMPLETE METABOLIC PANEL WITH GFR
AG Ratio: 1.6 (calc) (ref 1.0–2.5)
ALT: 17 U/L (ref 6–29)
AST: 17 U/L (ref 10–35)
Albumin: 4.4 g/dL (ref 3.6–5.1)
Alkaline phosphatase (APISO): 95 U/L (ref 37–153)
BUN: 15 mg/dL (ref 7–25)
CO2: 30 mmol/L (ref 20–32)
Calcium: 9.3 mg/dL (ref 8.6–10.4)
Chloride: 101 mmol/L (ref 98–110)
Creat: 0.71 mg/dL (ref 0.50–1.05)
GFR, Est African American: 112 mL/min/{1.73_m2} (ref >=60)
GFR, Est Non African American: 97 mL/min/{1.73_m2} (ref >=60)
Globulin: 2.8 g/dL (calc) (ref 1.9–3.7)
Glucose, Bld: 109 mg/dL — ABNORMAL HIGH (ref 65–99)
Potassium: 4.5 mmol/L (ref 3.5–5.3)
Sodium: 138 mmol/L (ref 135–146)
Total Bilirubin: 0.6 mg/dL (ref 0.2–1.2)
Total Protein: 7.2 g/dL (ref 6.1–8.1)

## 2019-08-12 LAB — HEMOGLOBIN A1C
Hgb A1c MFr Bld: 5.9 % of total Hgb — ABNORMAL HIGH (ref <5.7)
Mean Plasma Glucose: 123 (calc)
eAG (mmol/L): 6.8 (calc)

## 2019-08-12 LAB — LIPID PANEL
Cholesterol: 146 mg/dL (ref <200)
HDL: 53 mg/dL (ref >=50)
LDL Cholesterol (Calc): 74 mg/dL (calc)
Non-HDL Cholesterol (Calc): 93 mg/dL (calc) (ref <130)
Total CHOL/HDL Ratio: 2.8 (calc) (ref <5.0)
Triglycerides: 102 mg/dL (ref <150)

## 2019-08-19 ENCOUNTER — Other Ambulatory Visit: Payer: Self-pay | Admitting: Family Medicine

## 2019-08-24 ENCOUNTER — Other Ambulatory Visit: Payer: Self-pay

## 2019-08-24 ENCOUNTER — Encounter: Payer: Self-pay | Admitting: Family Medicine

## 2019-08-24 ENCOUNTER — Ambulatory Visit (INDEPENDENT_AMBULATORY_CARE_PROVIDER_SITE_OTHER): Payer: 59 | Admitting: Family Medicine

## 2019-08-24 VITALS — BP 118/74 | HR 98 | Temp 97.4°F | Resp 16 | Ht 61.0 in | Wt 172.0 lb

## 2019-08-24 DIAGNOSIS — E785 Hyperlipidemia, unspecified: Secondary | ICD-10-CM | POA: Diagnosis not present

## 2019-08-24 DIAGNOSIS — R5382 Chronic fatigue, unspecified: Secondary | ICD-10-CM | POA: Diagnosis not present

## 2019-08-24 DIAGNOSIS — I639 Cerebral infarction, unspecified: Secondary | ICD-10-CM

## 2019-08-24 DIAGNOSIS — R7303 Prediabetes: Secondary | ICD-10-CM | POA: Diagnosis not present

## 2019-08-24 DIAGNOSIS — E669 Obesity, unspecified: Secondary | ICD-10-CM | POA: Diagnosis not present

## 2019-08-24 DIAGNOSIS — N951 Menopausal and female climacteric states: Secondary | ICD-10-CM

## 2019-08-24 DIAGNOSIS — I10 Essential (primary) hypertension: Secondary | ICD-10-CM

## 2019-08-24 DIAGNOSIS — Z1159 Encounter for screening for other viral diseases: Secondary | ICD-10-CM | POA: Diagnosis not present

## 2019-08-24 DIAGNOSIS — E559 Vitamin D deficiency, unspecified: Secondary | ICD-10-CM | POA: Diagnosis not present

## 2019-08-24 DIAGNOSIS — F418 Other specified anxiety disorders: Secondary | ICD-10-CM

## 2019-08-24 DIAGNOSIS — G9332 Myalgic encephalomyelitis/chronic fatigue syndrome: Secondary | ICD-10-CM

## 2019-08-24 MED ORDER — AMPHETAMINE-DEXTROAMPHET ER 30 MG PO CP24: 30.0000 mg | ORAL_CAPSULE | ORAL | 0 refills | Status: DC

## 2019-08-24 MED ORDER — AMPHETAMINE-DEXTROAMPHETAMINE 10 MG PO TABS: ORAL_TABLET | ORAL | 0 refills | Status: DC

## 2019-08-24 MED ORDER — AMPHETAMINE-DEXTROAMPHET ER 30 MG PO CP24
30.0000 mg | ORAL_CAPSULE | ORAL | 0 refills | Status: DC
Start: 2019-11-24 — End: 2020-01-12

## 2019-08-24 MED FILL — AMPHETAMINE-DEXTROAMPHETAMI: 10 | 90 days supply | Qty: 90 | Fill #0

## 2019-08-24 MED FILL — ADDERALL XR 30 MG CAP SA: 30 | 90 days supply | Qty: 90 | Fill #0

## 2019-08-24 NOTE — Patient Instructions (Signed)
F/U in early  To mid November, call if you need me before   Please get fasting lipid, cmp and eGFR, HBA1C, TSH, cBC, vit D, hepatitis C screen  You are being referred to Dr Merlene Laughter specifically for needs discussed and f/u  We will order MRI brain and report will be sent to Dr Merlene Laughter   It is important that you exercise regularly at least 30 minutes 5 times a week. If you develop chest pain, have severe difficulty breathing, or feel very tired, stop exercising immediately and seek medical attention   Think about what you will eat, plan ahead. Choose " clean, green, fresh or frozen" over canned, processed or packaged foods which are more sugary, salty and fatty. 70 to 75% of food eaten should be vegetables and fruit. Three meals at set times with snacks allowed between meals, but they must be fruit or vegetables. Aim to eat over a 12 hour period , example 7 am to 7 pm, and STOP after  your last meal of the day. Drink water,generally about 64 ounces per day, no other drink is as healthy. Fruit juice is best enjoyed in a healthy way, by EATING the fruit.   Thanks for choosing [facility], we consider it a privelige to serve you.

## 2019-08-24 NOTE — Assessment & Plan Note (Signed)
  Patient re-educated about  the importance of commitment to a  minimum of 150 minutes of exercise per week as able.  The importance of healthy food choices with portion control discussed, as well as eating regularly and within a 12 hour window most days. The need to choose "clean , green" food 50 to 75% of the time is discussed, as well as to make water the primary drink and set a goal of 64 ounces water daily.    Weight /BMI 08/24/2019 02/18/2019 02/10/2019  WEIGHT 172 lb 168 lb 12.8 oz 168 lb 12.8 oz  HEIGHT 5\' 1"  5\' 1"  5\' 1"   BMI 32.5 kg/m2 31.89 kg/m2 31.89 kg/m2

## 2019-08-24 NOTE — Assessment & Plan Note (Signed)
Needs re imaging as none in 2 years, and has had in the past recurrence without symptoms. Also refer to Neurology for assessment , specifically as it relates to estrogen use , even topical , for vaginal dryness

## 2019-08-24 NOTE — Assessment & Plan Note (Signed)
Followed by gyne, hoping for estrogen supplement for symptoms

## 2019-08-24 NOTE — Assessment & Plan Note (Signed)
Managed with Adderall, continue same

## 2019-08-24 NOTE — Assessment & Plan Note (Signed)
Controlled, no change in medication DASH diet and commitment to daily physical activity for a minimum of 30 minutes discussed and encouraged, as a part of hypertension management. The importance of attaining a healthy weight is also discussed.  BP/Weight 08/24/2019 02/25/2019 02/18/2019 02/10/2019 02/08/2019 2/90/9030 03/14/9967  Systolic BP 249 324 199 144 458 483 507  Diastolic BP 74 65 80 74 82 78 73  Wt. (Lbs) 172 - 168.8 168.8 165 157 154  BMI 32.5 - 31.89 31.89 31.18 29.66 29.1

## 2019-08-24 NOTE — Progress Notes (Signed)
Carmen Mcintyre     MRN: [id number]      DOB: [birth year]   HPI Carmen Mcintyre is here for follow up and re-evaluation of chronic medical conditions, medication management and review of any available recent lab and radiology data.  Preventive health is updated, specifically  Cancer screening and Immunization.   She requests referral to neurologist specifically for clearance to use supplemental estrogen to help with severe vaginal dryness.  She denies any new neurologic changes no vision taste or changes in power however she does want to repeat brain brain scan to for reevaluation of stroke which she had a significant 1 in 2018. \Requests refill on adderall   ROS Denies recent fever or chills. Denies sinus pressure, nasal congestion, ear pain or sore throat. Denies chest congestion, productive cough or wheezing. Denies chest pains, palpitations and leg swelling Denies abdominal pain, nausea, vomiting,diarrhea or constipation.   Denies dysuria, frequency, hesitancy or incontinence. Denies joint pain, swelling and limitation in mobility. Denies headaches, seizures, numbness, or tingling. Denies depression, anxiety or insomnia. Denies skin break down or rash.   PE BP 118/74   Pulse 98   Temp (!) 97.4 F (36.3 C) (Temporal)   Resp 16   Ht 5\' 1"  (1.549 m)   Wt 172 lb (78 kg)   LMP 08/22/2016   SpO2 98%   BMI 32.50 kg/m    Patient alert and oriented and in no cardiopulmonary distress.  HEENT: No facial asymmetry, EOMI,     Neck supple .  Chest: Clear to auscultation bilaterally.  CVS: S1, S2 no murmurs, no S3.Regular rate.  ABD: Soft non tender.   Ext: No edema  MS: Adequate ROM spine, shoulders, hips and knees.  Skin: Intact, no ulcerations or rash noted.  Psych: Good eye contact, normal affect. Memory intact not anxious or depressed appearing.  CNS: CN 2-12 intact, power,  normal throughout.no focal deficits noted.   Assessment & Plan Hyperlipidemia LDL goal  <70 Hyperlipidemia:Low fat diet discussed and encouraged.   Lipid Panel  Lab Results  Component Value Date   CHOL 146 08/11/2019   HDL 53 08/11/2019   LDLCALC 74 08/11/2019   TRIG 102 08/11/2019   CHOLHDL 2.8 08/11/2019     Controlled, no change in medication Updated lab needed at/ before next visit.   Obesity (BMI 30.0-34.9)  Patient re-educated about  the importance of commitment to a  minimum of 150 minutes of exercise per week as able.  The importance of healthy food choices with portion control discussed, as well as eating regularly and within a 12 hour window most days. The need to choose "clean , green" food 50 to 75% of the time is discussed, as well as to make water the primary drink and set a goal of 64 ounces water daily.    Weight /BMI 08/24/2019 02/18/2019 02/10/2019  WEIGHT 172 lb 168 lb 12.8 oz 168 lb 12.8 oz  HEIGHT 5\' 1"  5\' 1"  5\' 1"   BMI 32.5 kg/m2 31.89 kg/m2 31.89 kg/m2      Prediabetes Improved Patient educated about the importance of limiting  Carbohydrate intake , the need to commit to daily physical activity for a minimum of 30 minutes , and to commit weight loss. The fact that changes in all these areas will reduce or eliminate all together the development of diabetes is stressed.   Diabetic Labs Latest Ref Rng & Units 08/11/2019 02/22/2019 02/10/2019 08/17/2018 02/26/2018  HbA1c <5.7 % of total Hgb 5.9(H) -  6.0(H) 5.7(H) 5.6  Chol <200 mg/dL 146 - 175 149 142  HDL > OR = 50 mg/dL 53 - 53 58 49(L)  Calc LDL mg/dL (calc) 74 - 97 72 77  Triglycerides <150 mg/dL 102 - 153(H) 109 82  Creatinine 0.50 - 1.05 mg/dL 0.71 0.72 0.73 0.78 0.71   BP/Weight 08/24/2019 02/25/2019 02/18/2019 02/10/2019 02/08/2019 5/73/2202 07/11/2704  Systolic BP 237 628 315 176 160 737 106  Diastolic BP 74 65 80 74 82 78 73  Wt. (Lbs) 172 - 168.8 168.8 165 157 154  BMI 32.5 - 31.89 31.89 31.18 29.66 29.1   No flowsheet data found.    CHRONIC FATIGUE SYNDROME Managed with  Adderall, continue same  Stroke (cerebrum) [facility] [address]) Needs re imaging as none in 2 years, and has had in the past recurrence without symptoms. Also refer to Neurology for assessment , specifically as it relates to estrogen use , even topical , for vaginal dryness  Vaginal dryness, menopausal Followed by gyne, hoping for estrogen supplement for symptoms  Depression with anxiety Controlled, no change in medication   HTN (hypertension) Controlled, no change in medication DASH diet and commitment to daily physical activity for a minimum of 30 minutes discussed and encouraged, as a part of hypertension management. The importance of attaining a healthy weight is also discussed.  BP/Weight 08/24/2019 02/25/2019 02/18/2019 02/10/2019 02/08/2019 2/69/4854 08/09/7033  Systolic BP 009 381 829 937 169 678 938  Diastolic BP 74 65 80 74 82 78 73  Wt. (Lbs) 172 - 168.8 168.8 165 157 154  BMI 32.5 - 31.89 31.89 31.18 29.66 29.1

## 2019-08-24 NOTE — Assessment & Plan Note (Signed)
Hyperlipidemia:Low fat diet discussed and encouraged.   Lipid Panel  Lab Results  Component Value Date   CHOL 146 08/11/2019   HDL 53 08/11/2019   LDLCALC 74 08/11/2019   TRIG 102 08/11/2019   CHOLHDL 2.8 08/11/2019     Controlled, no change in medication Updated lab needed at/ before next visit.

## 2019-08-24 NOTE — Assessment & Plan Note (Signed)
Controlled, no change in medication  

## 2019-08-24 NOTE — Assessment & Plan Note (Signed)
Improved Patient educated about the importance of limiting  Carbohydrate intake , the need to commit to daily physical activity for a minimum of 30 minutes , and to commit weight loss. The fact that changes in all these areas will reduce or eliminate all together the development of diabetes is stressed.   Diabetic Labs Latest Ref Rng & Units 08/11/2019 02/22/2019 02/10/2019 08/17/2018 02/26/2018  HbA1c <5.7 % of total Hgb 5.9(H) - 6.0(H) 5.7(H) 5.6  Chol <200 mg/dL 146 - 175 149 142  HDL > OR = 50 mg/dL 53 - 53 58 49(L)  Calc LDL mg/dL (calc) 74 - 97 72 77  Triglycerides <150 mg/dL 102 - 153(H) 109 82  Creatinine 0.50 - 1.05 mg/dL 0.71 0.72 0.73 0.78 0.71   BP/Weight 08/24/2019 02/25/2019 02/18/2019 02/10/2019 02/08/2019 0/53/9767 05/12/1935  Systolic BP 902 409 735 329 924 268 341  Diastolic BP 74 65 80 74 82 78 73  Wt. (Lbs) 172 - 168.8 168.8 165 157 154  BMI 32.5 - 31.89 31.89 31.18 29.66 29.1   No flowsheet data found.

## 2019-08-25 ENCOUNTER — Other Ambulatory Visit: Payer: Self-pay | Admitting: Family Medicine

## 2019-08-25 MED ORDER — VENLAFAXINE HCL ER 150 MG PO CP24: 150.0000 mg | ORAL_CAPSULE | Freq: Every day | ORAL | 3 refills | Status: DC

## 2019-08-25 MED ORDER — ATORVASTATIN CALCIUM 20 MG PO TABS
20.0000 mg | ORAL_TABLET | Freq: Every day | ORAL | 3 refills | Status: DC
Start: 2019-08-25 — End: 2019-08-25

## 2019-08-25 MED FILL — VENLAFAXINE HCL ER 150 MG C: 150 | 90 days supply | Qty: 90 | Fill #0

## 2019-08-25 MED FILL — ATORVASTATIN 20 MG TABLET: 20 | 90 days supply | Qty: 90 | Fill #0

## 2019-08-25 NOTE — Progress Notes (Signed)
effexor er 150

## 2019-08-26 ENCOUNTER — Other Ambulatory Visit: Payer: Self-pay | Admitting: Family Medicine

## 2019-09-01 DIAGNOSIS — L988 Other specified disorders of the skin and subcutaneous tissue: Secondary | ICD-10-CM | POA: Diagnosis not present

## 2019-09-01 DIAGNOSIS — L814 Other melanin hyperpigmentation: Secondary | ICD-10-CM | POA: Diagnosis not present

## 2019-09-22 ENCOUNTER — Other Ambulatory Visit: Payer: Self-pay

## 2019-09-22 ENCOUNTER — Ambulatory Visit (HOSPITAL_COMMUNITY)
Admission: RE | Admit: 2019-09-22 | Discharge: 2019-09-22 | Disposition: A | Discharge status: Home / Self Care | Payer: 59 | Source: Ambulatory Visit | Attending: Family Medicine | Admitting: Family Medicine

## 2019-09-22 DIAGNOSIS — I639 Cerebral infarction, unspecified: Secondary | ICD-10-CM | POA: Diagnosis not present

## 2019-09-22 DIAGNOSIS — R519 Headache, unspecified: Secondary | ICD-10-CM | POA: Diagnosis not present

## 2019-09-22 MED ORDER — GADOBUTROL 1 MMOL/ML IV SOLN
10.0000 mL | Freq: Once | INTRAVENOUS | Status: AC | PRN
  Administered 2019-09-22: 10 mL via INTRAVENOUS

## 2019-09-26 MED FILL — LISINOPRIL 10 MG TABS: 10 | 90 days supply | Qty: 90 | Fill #1

## 2019-10-07 ENCOUNTER — Other Ambulatory Visit: Payer: Self-pay

## 2019-10-07 ENCOUNTER — Ambulatory Visit
Admission: RE | Admit: 2019-10-07 | Discharge: 2019-10-07 | Disposition: A | Discharge status: Home / Self Care | Payer: 59 | Source: Ambulatory Visit | Attending: Family Medicine | Admitting: Family Medicine

## 2019-10-07 DIAGNOSIS — Z1231 Encounter for screening mammogram for malignant neoplasm of breast: Secondary | ICD-10-CM | POA: Diagnosis not present

## 2019-10-11 MED FILL — VIT D2 1.25 MG (50,000 UNIT: 1.25 MG | 84 days supply | Qty: 12 | Fill #1

## 2019-11-01 ENCOUNTER — Other Ambulatory Visit (HOSPITAL_COMMUNITY): Payer: Self-pay | Admitting: Medical

## 2019-11-01 DIAGNOSIS — L700 Acne vulgaris: Secondary | ICD-10-CM | POA: Diagnosis not present

## 2019-11-01 DIAGNOSIS — D225 Melanocytic nevi of trunk: Secondary | ICD-10-CM | POA: Diagnosis not present

## 2019-11-01 DIAGNOSIS — L821 Other seborrheic keratosis: Secondary | ICD-10-CM | POA: Diagnosis not present

## 2019-11-01 DIAGNOSIS — L814 Other melanin hyperpigmentation: Secondary | ICD-10-CM | POA: Diagnosis not present

## 2019-11-01 DIAGNOSIS — L570 Actinic keratosis: Secondary | ICD-10-CM | POA: Diagnosis not present

## 2019-11-01 DIAGNOSIS — L719 Rosacea, unspecified: Secondary | ICD-10-CM | POA: Diagnosis not present

## 2019-11-16 MED FILL — TRETINOIN 0.05 % CREA: 0.05 | 30 days supply | Qty: 20 | Fill #0

## 2019-11-17 MED FILL — ATORVASTATIN CALCIUM 20 MG: 20 | 90 days supply | Qty: 90 | Fill #1

## 2019-11-17 MED FILL — VENLAFAXINE HCL ER 150 MG C: 150 | 90 days supply | Qty: 90 | Fill #1

## 2019-11-23 ENCOUNTER — Other Ambulatory Visit: Payer: Self-pay | Admitting: Family Medicine

## 2019-11-23 MED ORDER — AMPHETAMINE-DEXTROAMPHETAMINE 10 MG PO TABS: ORAL_TABLET | ORAL | 0 refills | Status: DC

## 2019-11-23 MED ORDER — AMPHETAMINE-DEXTROAMPHET ER 30 MG PO CP24: 30.0000 mg | ORAL_CAPSULE | Freq: Every day | ORAL | 0 refills | Status: DC

## 2019-11-23 MED FILL — AMPHETAMINE SALTS 10 MG: 10 | 90 days supply | Qty: 90 | Fill #0

## 2019-11-23 MED FILL — ADDERALL XR 30 MG CAP SA: 30 | 90 days supply | Qty: 90 | Fill #0

## 2019-12-19 ENCOUNTER — Other Ambulatory Visit: Payer: 59

## 2019-12-26 ENCOUNTER — Other Ambulatory Visit: Payer: Self-pay | Admitting: Family Medicine

## 2019-12-26 DIAGNOSIS — R7303 Prediabetes: Secondary | ICD-10-CM

## 2019-12-26 DIAGNOSIS — H524 Presbyopia: Secondary | ICD-10-CM | POA: Diagnosis not present

## 2019-12-26 MED FILL — LISINOPRIL 10 MG TABS: 10 | 90 days supply | Qty: 90 | Fill #0

## 2020-01-02 ENCOUNTER — Other Ambulatory Visit: Payer: Self-pay

## 2020-01-02 DIAGNOSIS — R7303 Prediabetes: Secondary | ICD-10-CM

## 2020-01-02 MED ORDER — LISINOPRIL 10 MG PO TABS: 10.0000 mg | ORAL_TABLET | Freq: Every day | ORAL | 1 refills | Status: DC

## 2020-01-03 ENCOUNTER — Other Ambulatory Visit: Payer: Self-pay | Admitting: Family Medicine

## 2020-01-03 MED FILL — VIT D2 1.25 MG (50,000 UNIT: 1.25 MG | 84 days supply | Qty: 12 | Fill #0

## 2020-01-05 MED FILL — TRETINOIN 0.05 % CREA: 0.05 | 30 days supply | Qty: 20 | Fill #1

## 2020-01-12 ENCOUNTER — Ambulatory Visit (INDEPENDENT_AMBULATORY_CARE_PROVIDER_SITE_OTHER): Payer: 59 | Admitting: Family Medicine

## 2020-01-12 ENCOUNTER — Encounter: Payer: Self-pay | Admitting: Family Medicine

## 2020-01-12 ENCOUNTER — Other Ambulatory Visit: Payer: Self-pay | Admitting: Family Medicine

## 2020-01-12 ENCOUNTER — Other Ambulatory Visit: Payer: Self-pay

## 2020-01-12 VITALS — BP 136/80 | HR 97 | Ht 61.0 in | Wt 168.0 lb

## 2020-01-12 DIAGNOSIS — R5382 Chronic fatigue, unspecified: Secondary | ICD-10-CM

## 2020-01-12 DIAGNOSIS — G9332 Myalgic encephalomyelitis/chronic fatigue syndrome: Secondary | ICD-10-CM

## 2020-01-12 DIAGNOSIS — R232 Flushing: Secondary | ICD-10-CM | POA: Diagnosis not present

## 2020-01-12 DIAGNOSIS — R7303 Prediabetes: Secondary | ICD-10-CM

## 2020-01-12 DIAGNOSIS — F324 Major depressive disorder, single episode, in partial remission: Secondary | ICD-10-CM | POA: Diagnosis not present

## 2020-01-12 DIAGNOSIS — T753XXS Motion sickness, sequela: Secondary | ICD-10-CM

## 2020-01-12 DIAGNOSIS — E669 Obesity, unspecified: Secondary | ICD-10-CM | POA: Diagnosis not present

## 2020-01-12 DIAGNOSIS — E785 Hyperlipidemia, unspecified: Secondary | ICD-10-CM

## 2020-01-12 DIAGNOSIS — I10 Essential (primary) hypertension: Secondary | ICD-10-CM

## 2020-01-12 MED ORDER — SCOPOLAMINE 1 MG/3DAYS TD PT72: 1.0000 | MEDICATED_PATCH | TRANSDERMAL | 2 refills | Status: DC

## 2020-01-12 MED ORDER — BUPROPION HCL ER (XL) 150 MG PO TB24
150.0000 mg | ORAL_TABLET | Freq: Every day | ORAL | 1 refills | Status: DC
Start: 2020-01-12 — End: 2020-05-07

## 2020-01-12 MED FILL — SCOPOLAMINE 1 MG/3 DAY PATC: 1 | 30 days supply | Qty: 10 | Fill #0

## 2020-01-12 MED FILL — buPROPion HCL ER (XL) 150 M: 150 | 60 days supply | Qty: 60 | Fill #0

## 2020-01-12 NOTE — Assessment & Plan Note (Signed)
DASH diet and commitment to daily physical activity for a minimum of 30 minutes discussed and encouraged, as a part of hypertension management. The importance of attaining a healthy weight is also discussed.  BP/Weight 01/12/2020 08/24/2019 02/25/2019 02/18/2019 02/10/2019 02/08/2019 0/94/0005  Systolic BP 056 788 933 882 666 648 616  Diastolic BP 80 74 65 80 74 82 78  Wt. (Lbs) 168 172 - 168.8 168.8 165 157  BMI 31.74 32.5 - 31.89 31.89 31.18 29.66

## 2020-01-12 NOTE — Patient Instructions (Addendum)
F/U with MD in 7 weeks, call if you need me sooner  New additional medicaion for depression is wellbutrin 150 mg one daily, continue effexor as before  Trans scopolamine is prescribed for motion sickness, as requested. Have a safe and fun trip!  Please get fasting labs next week  Congrats on 4 pound weight loss    It is important that you exercise regularly at least 30 minutes 5 times a week. If you develop chest pain, have severe difficulty breathing, or feel very tired, stop exercising immediately and seek medical attention    Think about what you will eat, plan ahead. Choose " clean, green, fresh or frozen" over canned, processed or packaged foods which are more sugary, salty and fatty. 70 to 75% of food eaten should be vegetables and fruit. Three meals at set times with snacks allowed between meals, but they must be fruit or vegetables. Aim to eat over a 12 hour period , example 7 am to 7 pm, and STOP after  your last meal of the day. Drink water,generally about 64 ounces per day, no other drink is as healthy. Fruit juice is best enjoyed in a healthy way, by EATING the fruit.

## 2020-01-12 NOTE — Progress Notes (Signed)
Virtual Visit via video Note  I connected with Carmen Mcintyre on 01/12/20 at  8:00 AM EDT by video and verified that I am speaking with the correct person using two identifiers.  Location: Patient: home Provider: work   I discussed the limitations, risks, security and privacy concerns of performing an evaluation and management service by telephone and the availability of in person appointments. I also discussed with the patient that there may be a patient responsible charge related to this service. The patient expressed understanding and agreed to proceed.   History of Present Illness: F/u chronic problems  Approximate 4 to  Week h/o increased feeloing of being down and depressed, not suicidal or homicidal, but decreased interest and energy and not enjoying things as in the past. Has questions about starting wellbutrin Denies anxiety with normal GAD screen No other concerns currently Travelling in the next week and requests trans scoplanmine No further follow up on brain scan with neurology neither any f/u with gyne re hRT,not a priority at this time  Does have SAD Is walking 3 days/ week, and is trying to be careful with food choice,  Has lost 4 pounds since last visit      Observations/Objective: BP 136/80 Comment: all vitals per patient  Pulse 97   Ht 5\' 1"  (1.549 m)   Wt 168 lb (76.2 kg)   LMP 08/22/2016   SpO2 98%   BMI 31.74 kg/m  Good communication with no confusion and intact memory. Alert and oriented x 3 No signs of respiratory distress during speech    Assessment and Plan: Hyperlipidemia LDL goal <70 Hyperlipidemia:Low fat diet discussed and encouraged.   Lipid Panel  Lab Results  Component Value Date   CHOL 146 08/11/2019   HDL 53 08/11/2019   LDLCALC 74 08/11/2019   TRIG 102 08/11/2019   CHOLHDL 2.8 08/11/2019     Updated lab needed at/ before next visit.   Prediabetes Patient educated about the importance of limiting  Carbohydrate intake ,  the need to commit to daily physical activity for a minimum of 30 minutes , and to commit weight loss. The fact that changes in all these areas will reduce or eliminate all together the development of diabetes is stressed.   Diabetic Labs Latest Ref Rng & Units 08/11/2019 02/22/2019 02/10/2019 08/17/2018 02/26/2018  HbA1c <5.7 % of total Hgb 5.9(H) - 6.0(H) 5.7(H) 5.6  Chol <200 mg/dL 146 - 175 149 142  HDL > OR = 50 mg/dL 53 - 53 58 49(L)  Calc LDL mg/dL (calc) 74 - 97 72 77  Triglycerides <150 mg/dL 102 - 153(H) 109 82  Creatinine 0.50 - 1.05 mg/dL 0.71 0.72 0.73 0.78 0.71   BP/Weight 01/12/2020 08/24/2019 02/25/2019 02/18/2019 02/10/2019 02/08/2019 6/44/0347  Systolic BP 425 956 387 564 332 951 884  Diastolic BP 80 74 65 80 74 82 78  Wt. (Lbs) 168 172 - 168.8 168.8 165 157  BMI 31.74 32.5 - 31.89 31.89 31.18 29.66   No flowsheet data found.  Updated lab needed at/ before next visit.   Depression with anxiety Add wellbutrin daily , re assess in 8 weeks  Hot flashes Improved and controlled   HTN (hypertension) DASH diet and commitment to daily physical activity for a minimum of 30 minutes discussed and encouraged, as a part of hypertension management. The importance of attaining a healthy weight is also discussed.  BP/Weight 01/12/2020 08/24/2019 02/25/2019 02/18/2019 02/10/2019 02/08/2019 1/66/0630  Systolic BP 160 109 323 557 114  836 629  Diastolic BP 80 74 65 80 74 82 78  Wt. (Lbs) 168 172 - 168.8 168.8 165 157  BMI 31.74 32.5 - 31.89 31.89 31.18 29.66       Chronic fatigue disorder Controlled on current dose of adderalll  Obesity (BMI 30.0-34.9)  Patient re-educated about  the importance of commitment to a  minimum of 150 minutes of exercise per week as able.  The importance of healthy food choices with portion control discussed, as well as eating regularly and within a 12 hour window most days. The need to choose "clean , green" food 50 to 75% of the time is discussed, as  well as to make water the primary drink and set a goal of 64 ounces water daily.    Weight /BMI 01/12/2020 08/24/2019 02/18/2019  WEIGHT 168 lb 172 lb 168 lb 12.8 oz  HEIGHT 5\' 1"  5\' 1"  5\' 1"   BMI 31.74 kg/m2 32.5 kg/m2 31.89 kg/m2      Motion sickness Trans scopolamine prescribed in anticipation of upcoming travel     Follow Up Instructions:    I discussed the assessment and treatment plan with the patient. The patient was provided an opportunity to ask questions and all were answered. The patient agreed with the plan and demonstrated an understanding of the instructions.   The patient was advised to call back or seek an in-person evaluation if the symptoms worsen or if the condition fails to improve as anticipated.  I provided 20 minutes of non-face-to-face time during this encounter.   Tula Nakayama, MD

## 2020-01-12 NOTE — Assessment & Plan Note (Signed)
Patient educated about the importance of limiting  Carbohydrate intake , the need to commit to daily physical activity for a minimum of 30 minutes , and to commit weight loss. The fact that changes in all these areas will reduce or eliminate all together the development of diabetes is stressed.   Diabetic Labs Latest Ref Rng & Units 08/11/2019 02/22/2019 02/10/2019 08/17/2018 02/26/2018  HbA1c <5.7 % of total Hgb 5.9(H) - 6.0(H) 5.7(H) 5.6  Chol <200 mg/dL 146 - 175 149 142  HDL > OR = 50 mg/dL 53 - 53 58 49(L)  Calc LDL mg/dL (calc) 74 - 97 72 77  Triglycerides <150 mg/dL 102 - 153(H) 109 82  Creatinine 0.50 - 1.05 mg/dL 0.71 0.72 0.73 0.78 0.71   BP/Weight 01/12/2020 08/24/2019 02/25/2019 02/18/2019 02/10/2019 02/08/2019 4/48/1856  Systolic BP 314 970 263 785 885 027 741  Diastolic BP 80 74 65 80 74 82 78  Wt. (Lbs) 168 172 - 168.8 168.8 165 157  BMI 31.74 32.5 - 31.89 31.89 31.18 29.66   No flowsheet data found.  Updated lab needed at/ before next visit.

## 2020-01-12 NOTE — Assessment & Plan Note (Signed)
Trans scopolamine prescribed in anticipation of upcoming travel

## 2020-01-12 NOTE — Assessment & Plan Note (Signed)
Add wellbutrin daily , re assess in 8 weeks

## 2020-01-12 NOTE — Assessment & Plan Note (Signed)
Controlled on current dose of adderalll

## 2020-01-12 NOTE — Assessment & Plan Note (Signed)
Improved and controlled

## 2020-01-12 NOTE — Assessment & Plan Note (Signed)
  Patient re-educated about  the importance of commitment to a  minimum of 150 minutes of exercise per week as able.  The importance of healthy food choices with portion control discussed, as well as eating regularly and within a 12 hour window most days. The need to choose "clean , green" food 50 to 75% of the time is discussed, as well as to make water the primary drink and set a goal of 64 ounces water daily.    Weight /BMI 01/12/2020 08/24/2019 02/18/2019  WEIGHT 168 lb 172 lb 168 lb 12.8 oz  HEIGHT 5\' 1"  5\' 1"  5\' 1"   BMI 31.74 kg/m2 32.5 kg/m2 31.89 kg/m2

## 2020-01-12 NOTE — Assessment & Plan Note (Signed)
Hyperlipidemia:Low fat diet discussed and encouraged.   Lipid Panel  Lab Results  Component Value Date   CHOL 146 08/11/2019   HDL 53 08/11/2019   LDLCALC 74 08/11/2019   TRIG 102 08/11/2019   CHOLHDL 2.8 08/11/2019     Updated lab needed at/ before next visit.

## 2020-01-17 ENCOUNTER — Other Ambulatory Visit: Payer: Self-pay | Admitting: Family Medicine

## 2020-01-17 ENCOUNTER — Ambulatory Visit: Payer: 59 | Admitting: Family Medicine

## 2020-01-17 ENCOUNTER — Other Ambulatory Visit: Payer: Self-pay

## 2020-01-17 ENCOUNTER — Telehealth (INDEPENDENT_AMBULATORY_CARE_PROVIDER_SITE_OTHER): Payer: 59 | Admitting: Family Medicine

## 2020-01-17 ENCOUNTER — Encounter: Payer: Self-pay | Admitting: Family Medicine

## 2020-01-17 DIAGNOSIS — F419 Anxiety disorder, unspecified: Secondary | ICD-10-CM | POA: Diagnosis not present

## 2020-01-17 DIAGNOSIS — I10 Essential (primary) hypertension: Secondary | ICD-10-CM | POA: Diagnosis not present

## 2020-01-17 DIAGNOSIS — R0689 Other abnormalities of breathing: Secondary | ICD-10-CM | POA: Diagnosis not present

## 2020-01-17 MED ORDER — ALPRAZOLAM 0.25 MG PO TABS
0.2500 mg | ORAL_TABLET | Freq: Three times a day (TID) | ORAL | 0 refills | Status: DC | PRN
Start: 2020-01-17 — End: 2020-03-20

## 2020-01-17 MED FILL — ALPRAZolam 0.25 MG TABS: 0.25 | 14 days supply | Qty: 42 | Fill #0

## 2020-01-17 NOTE — Progress Notes (Signed)
Virtual Visit via Telephone Note  I connected with [name] on 01/17/20 at  4:00 PM EST by telephone and verified that I am speaking with the correct person using two identifiers.  Location: Patient: home Provider: office   I discussed the limitations, risks, security and privacy concerns of performing an evaluation and management service by telephone and the availability of in person appointments. I also discussed with the patient that there may be a patient responsible charge related to this service. The patient expressed understanding and agreed to proceed.   History of Present Illness: Two episodes of extreme anxiety in past 2 days as she contemplates being away from her children on a 3 week vacation [place] Excess crying and sobbing, has to be consoled physically by someone being present with her. Has support from her spouse who she is going with, and her children also are aware of the sitution and will visit for the next 2 days.     Observations/Objective:  LMP 08/22/2016  Good communication with no confusion and intact memory. Alert and oriented x 3 No signs of respiratory distress during speech   Assessment and Plan: Anxiety Two episodes of uncontrolled anxiety, with excessive crying and fear related to upcoming vacation outside of [place] Limited supply of low dose xanax is prescribed.  Discussed coping strategies for Neeti when she starts to become anxious and even before she does, she is in agreement and understands    Follow Up Instructions:    I discussed the assessment and treatment plan with the patient. The patient was provided an opportunity to ask questions and all were answered. The patient agreed with the plan and demonstrated an understanding of the instructions.   The patient was advised to call back or seek an in-person evaluation if the symptoms worsen or if the condition fails to improve as anticipated.  I provided 12  minutes of  non-face-to-face time during this encounter.   Tula Nakayama, MD

## 2020-01-17 NOTE — Patient Instructions (Signed)
F/U as before, call if you need me sooner  Limited quantity of medication for anxiety is prescribed for you to use as needed for anxiety  Plan ahead with strategies to reduce your anxiety when it starts, getting physical and emotional support from family and friends is a GREAT thing  Also regular face time check -ins with your children while you are away will be extremely   Beneficial  Have a fun and safe trip!

## 2020-01-18 ENCOUNTER — Encounter: Payer: Self-pay | Admitting: Family Medicine

## 2020-01-18 ENCOUNTER — Other Ambulatory Visit: Payer: 59

## 2020-01-18 DIAGNOSIS — Z20822 Contact with and (suspected) exposure to covid-19: Secondary | ICD-10-CM

## 2020-01-18 NOTE — Assessment & Plan Note (Signed)
Two episodes of uncontrolled anxiety, with excessive crying and fear related to upcoming vacation [place] Limited supply of low dose xanax is prescribed.  Discussed coping strategies for [facility] when she starts to become anxious and even before she does, she is in agreement and understands

## 2020-01-19 LAB — NOVEL CORONAVIRUS, NAA: SARS-CoV-2, NAA: NOT DETECTED

## 2020-01-19 LAB — SPECIMEN STATUS REPORT

## 2020-01-19 LAB — SARS-COV-2, NAA 2 DAY TAT

## 2020-01-30 ENCOUNTER — Other Ambulatory Visit: Payer: Self-pay | Admitting: Family Medicine

## 2020-02-06 MED FILL — SCOPOLAMINE 1 MG/3 DAY PATC: 1 | 30 days supply | Qty: 10 | Fill #1

## 2020-02-15 MED FILL — VENLAFAXINE HCL ER 150 MG C: 150 | 90 days supply | Qty: 90 | Fill #2

## 2020-02-15 MED FILL — ATORVASTATIN CALCIUM 20 MG: 20 | 90 days supply | Qty: 90 | Fill #2

## 2020-02-20 MED FILL — AMPHETAMINE SALTS 10 MG: 10 | 90 days supply | Qty: 90 | Fill #0

## 2020-02-21 ENCOUNTER — Other Ambulatory Visit: Payer: Self-pay | Admitting: Family Medicine

## 2020-02-22 ENCOUNTER — Other Ambulatory Visit: Payer: Self-pay | Admitting: Family Medicine

## 2020-02-22 MED ORDER — AMPHETAMINE-DEXTROAMPHET ER 30 MG PO CP24
30.0000 mg | ORAL_CAPSULE | ORAL | 0 refills | Status: DC
Start: 2020-02-22 — End: 2020-05-28

## 2020-02-22 MED FILL — ADDERALL XR 30 MG CAP SA: 30 | 90 days supply | Qty: 90 | Fill #0

## 2020-03-06 MED FILL — buPROPion HCL ER (XL) 150 M: 150 | 60 days supply | Qty: 60 | Fill #1

## 2020-03-06 MED FILL — SCOPOLAMINE 1 MG/3 DAY PATC: 1 | 30 days supply | Qty: 10 | Fill #2

## 2020-03-08 ENCOUNTER — Ambulatory Visit: Payer: 59 | Admitting: Family Medicine

## 2020-03-19 MED FILL — LISINOPRIL 10 MG TABS: 10 | 90 days supply | Qty: 90 | Fill #1

## 2020-03-20 ENCOUNTER — Other Ambulatory Visit: Payer: Self-pay

## 2020-03-20 ENCOUNTER — Telehealth: Payer: 59 | Admitting: Family Medicine

## 2020-03-20 ENCOUNTER — Encounter: Payer: Self-pay | Admitting: Family Medicine

## 2020-03-20 VITALS — BP 127/70 | HR 88 | Ht 61.0 in | Wt 170.0 lb

## 2020-03-20 DIAGNOSIS — E559 Vitamin D deficiency, unspecified: Secondary | ICD-10-CM

## 2020-03-20 DIAGNOSIS — R5382 Chronic fatigue, unspecified: Secondary | ICD-10-CM

## 2020-03-20 DIAGNOSIS — Z1159 Encounter for screening for other viral diseases: Secondary | ICD-10-CM

## 2020-03-20 DIAGNOSIS — F324 Major depressive disorder, single episode, in partial remission: Secondary | ICD-10-CM | POA: Diagnosis not present

## 2020-03-20 DIAGNOSIS — E669 Obesity, unspecified: Secondary | ICD-10-CM | POA: Diagnosis not present

## 2020-03-20 DIAGNOSIS — E8881 Metabolic syndrome: Secondary | ICD-10-CM | POA: Diagnosis not present

## 2020-03-20 DIAGNOSIS — I10 Essential (primary) hypertension: Secondary | ICD-10-CM | POA: Diagnosis not present

## 2020-03-20 DIAGNOSIS — E785 Hyperlipidemia, unspecified: Secondary | ICD-10-CM | POA: Diagnosis not present

## 2020-03-20 DIAGNOSIS — G9332 Myalgic encephalomyelitis/chronic fatigue syndrome: Secondary | ICD-10-CM

## 2020-03-20 DIAGNOSIS — R7303 Prediabetes: Secondary | ICD-10-CM

## 2020-03-20 NOTE — Progress Notes (Signed)
Virtual Visit via Telephone Note  I connected with Carmen Mcintyre on 03/20/20 at  2:40 PM EST by telephone and verified that I am speaking with the correct person using two identifiers.  Location: Patient: home  Provider: work   I discussed the limitations, risks, security and privacy concerns of performing an evaluation and management service by telephone and the availability of in person appointments. I also discussed with the patient that there may be a patient responsible charge related to this service. The patient expressed understanding and agreed to proceed.   History of Present Illness: F/U chronic problems Feeling much better than last visit , less anxious and depressed, used xanax sparingly while on vacation and had a great time Starting again with exercise and dietary change for weight loss Denies recent fever or chills. Denies sinus pressure, nasal congestion, ear pain or sore throat. Denies chest congestion, productive cough or wheezing. Denies chest pains, palpitations and leg swelling Denies abdominal pain, nausea, vomiting,diarrhea or constipation.   Denies dysuria, frequency, hesitancy or incontinence. Denies joint pain, swelling and limitation in mobility. Denies headaches, seizures, numbness, or tingling. Denie uncontrolled  depression, anxiety or insomnia. Denies skin break down or rash.       Observations/Objective: BP 127/70   Pulse 88   Ht 5\' 1"  (1.549 m)   Wt 170 lb (77.1 kg)   LMP 08/22/2016   SpO2 98%   BMI 32.12 kg/m  Good communication with no confusion and intact memory. Alert and oriented x 3 No signs of respiratory distress during speech    Assessment and Plan: HTN (hypertension) Controlled, no change in medication DASH diet and commitment to daily physical activity for a minimum of 30 minutes discussed and encouraged, as a part of hypertension management. The importance of attaining a healthy weight is also discussed.  BP/Weight  03/20/2020 01/12/2020 08/24/2019 02/25/2019 02/18/2019 02/10/2019 12/08/7508  Systolic BP 258 527 782 423 536 144 315  Diastolic BP 70 80 74 65 80 74 82  Wt. (Lbs) 170 168 172 - 168.8 168.8 165  BMI 32.12 31.74 32.5 - 31.89 31.89 31.18       Hyperlipidemia LDL goal <70 Hyperlipidemia:Low fat diet discussed and encouraged.   Lipid Panel  Lab Results  Component Value Date   CHOL 146 08/11/2019   HDL 53 08/11/2019   LDLCALC 74 08/11/2019   TRIG 102 08/11/2019   CHOLHDL 2.8 08/11/2019     Updated lab needed at/ before next visit.   Obesity (BMI 30.0-34.9)  Patient re-educated about  the importance of commitment to a  minimum of 150 minutes of exercise per week as able.  The importance of healthy food choices with portion control discussed, as well as eating regularly and within a 12 hour window most days. The need to choose "clean , green" food 50 to 75% of the time is discussed, as well as to make water the primary drink and set a goal of 64 ounces water daily.    Weight /BMI 03/20/2020 01/12/2020 08/24/2019  WEIGHT 170 lb 168 lb 172 lb  HEIGHT 5\' 1"  5\' 1"  5\' 1"   BMI 32.12 kg/m2 31.74 kg/m2 32.5 kg/m2      Prediabetes Patient educated about the importance of limiting  Carbohydrate intake , the need to commit to daily physical activity for a minimum of 30 minutes , and to commit weight loss. The fact that changes in all these areas will reduce or eliminate all together the development of diabetes is stressed.  Updated lab needed at/ before next visit.   Diabetic Labs Latest Ref Rng & Units 08/11/2019 02/22/2019 02/10/2019 08/17/2018 02/26/2018  HbA1c <5.7 % of total Hgb 5.9(H) - 6.0(H) 5.7(H) 5.6  Chol <200 mg/dL 146 - 175 149 142  HDL > OR = 50 mg/dL 53 - 53 58 49(L)  Calc LDL mg/dL (calc) 74 - 97 72 77  Triglycerides <150 mg/dL 102 - 153(H) 109 82  Creatinine 0.50 - 1.05 mg/dL 0.71 0.72 0.73 0.78 0.71   BP/Weight 03/20/2020 01/12/2020 08/24/2019 02/25/2019 02/18/2019 02/10/2019  54/07/6254  Systolic BP 389 373 428 768 115 726 203  Diastolic BP 70 80 74 65 80 74 82  Wt. (Lbs) 170 168 172 - 168.8 168.8 165  BMI 32.12 31.74 32.5 - 31.89 31.89 31.18   No flowsheet data found.    Depression, major, single episode, in partial remission (HCC) Controlled, no change in medication     Follow Up Instructions:    I discussed the assessment and treatment plan with the patient. The patient was provided an opportunity to ask questions and all were answered. The patient agreed with the plan and demonstrated an understanding of the instructions.   The patient was advised to call back or seek an in-person evaluation if the symptoms worsen or if the condition fails to improve as anticipated.  I provided 22 minutes of non-face-to-face time during this encounter.   Tula Nakayama, MD

## 2020-03-20 NOTE — Patient Instructions (Addendum)
F/U in office with MD in 4 monhts, call if you need me sooner  Please get fasting CBC lipid CMP and EGFR hepatitis C screen TSH hemoglobin A1c and vitamin D within the next 1 week.  Thankful that you are doing well overall.  Please call if anything changes.  No changes in current medication.  It is important that you exercise regularly at least 30 minutes 5 times a week. If you develop chest pain, have severe difficulty breathing, or feel very tired, stop exercising immediately and seek medical attention     Calorie Counting for Weight Loss Calories are units of energy. Your body needs a certain number of calories from food to keep going throughout the day. When you eat or drink more calories than your body needs, your body stores the extra calories mostly as fat. When you eat or drink fewer calories than your body needs, your body burns fat to get the energy it needs. Calorie counting means keeping track of how many calories you eat and drink each day. Calorie counting can be helpful if you need to lose weight. If you eat fewer calories than your body needs, you should lose weight. Ask your health care provider what a healthy weight is for you. For calorie counting to work, you will need to eat the right number of calories each day to lose a healthy amount of weight per week. A dietitian can help you figure out how many calories you need in a day and will suggest ways to reach your calorie goal.  A healthy amount of weight to lose each week is usually 1-2 lb (0.5-0.9 kg). This usually means that your daily calorie intake should be reduced by 500-750 calories.  Eating 1,200-1,500 calories a day can help most women lose weight.  Eating 1,500-1,800 calories a day can help most men lose weight. What do I need to know about calorie counting? Work with your health care provider or dietitian to determine how many calories you should get each day. To meet your daily calorie goal, you will need  to:  Find out how many calories are in each food that you would like to eat. Try to do this before you eat.  Decide how much of the food you plan to eat.  Keep a food log. Do this by writing down what you ate and how many calories it had. To successfully lose weight, it is important to balance calorie counting with a healthy lifestyle that includes regular activity. Where do I find calorie information? The number of calories in a food can be found on a Nutrition Facts label. If a food does not have a Nutrition Facts label, try to look up the calories online or ask your dietitian for help. Remember that calories are listed per serving. If you choose to have more than one serving of a food, you will have to multiply the calories per serving by the number of servings you plan to eat. For example, the label on a package of bread might say that a serving size is 1 slice and that there are 90 calories in a serving. If you eat 1 slice, you will have eaten 90 calories. If you eat 2 slices, you will have eaten 180 calories.   How do I keep a food log? After each time that you eat, record the following in your food log as soon as possible:  What you ate. Be sure to include toppings, sauces, and other extras on the  food.  How much you ate. This can be measured in cups, ounces, or number of items.  How many calories were in each food and drink.  The total number of calories in the food you ate. Keep your food log near you, such as in a pocket-sized notebook or on an app or website on your mobile phone. Some programs will calculate calories for you and show you how many calories you have left to meet your daily goal. What are some portion-control tips?  Know how many calories are in a serving. This will help you know how many servings you can have of a certain food.  Use a measuring cup to measure serving sizes. You could also try weighing out portions on a kitchen scale. With time, you will be able to  estimate serving sizes for some foods.  Take time to put servings of different foods on your favorite plates or in your favorite bowls and cups so you know what a serving looks like.  Try not to eat straight from a food's packaging, such as from a bag or box. Eating straight from the package makes it hard to see how much you are eating and can lead to overeating. Put the amount you would like to eat in a cup or on a plate to make sure you are eating the right portion.  Use smaller plates, glasses, and bowls for smaller portions and to prevent overeating.  Try not to multitask. For example, avoid watching TV or using your computer while eating. If it is time to eat, sit down at a table and enjoy your food. This will help you recognize when you are full. It will also help you be more mindful of what and how much you are eating. What are tips for following this plan? Reading food labels  Check the calorie count compared with the serving size. The serving size may be smaller than what you are used to eating.  Check the source of the calories. Try to choose foods that are high in protein, fiber, and vitamins, and low in saturated fat, trans fat, and sodium. Shopping  Read nutrition labels while you shop. This will help you make healthy decisions about which foods to buy.  Pay attention to nutrition labels for low-fat or fat-free foods. These foods sometimes have the same number of calories or more calories than the full-fat versions. They also often have added sugar, starch, or salt to make up for flavor that was removed with the fat.  Make a grocery list of lower-calorie foods and stick to it. Cooking  Try to cook your favorite foods in a healthier way. For example, try baking instead of frying.  Use low-fat dairy products. Meal planning  Use more fruits and vegetables. One-half of your plate should be fruits and vegetables.  Include lean proteins, such as chicken, [place], and  fish. Lifestyle Each week, aim to do one of the following:  150 minutes of moderate exercise, such as walking.  75 minutes of vigorous exercise, such as running. General information  Know how many calories are in the foods you eat most often. This will help you calculate calorie counts faster.  Find a way of tracking calories that works for you. Get creative. Try different apps or programs if writing down calories does not work for you. What foods should I eat?  Eat nutritious foods. It is better to have a nutritious, high-calorie food, such as an avocado, than a food with few  nutrients, such as a bag of potato chips.  Use your calories on foods and drinks that will fill you up and will not leave you hungry soon after eating. ? Examples of foods that fill you up are nuts and nut butters, vegetables, lean proteins, and high-fiber foods such as whole grains. High-fiber foods are foods with more than 5 g of fiber per serving.  Pay attention to calories in drinks. Low-calorie drinks include water and unsweetened drinks. The items listed above may not be a complete list of foods and beverages you can eat. Contact a dietitian for more information.   What foods should I limit? Limit foods or drinks that are not good sources of vitamins, minerals, or protein or that are high in unhealthy fats. These include:  Candy.  Other sweets.  Sodas, specialty coffee drinks, alcohol, and juice. The items listed above may not be a complete list of foods and beverages you should avoid. Contact a dietitian for more information. How do I count calories when eating out?  Pay attention to portions. Often, portions are much larger when eating out. Try these tips to keep portions smaller: ? Consider sharing a meal instead of getting your own. ? If you get your own meal, eat only half of it. Before you start eating, ask for a container and put half of your meal into it. ? When available, consider ordering  smaller portions from the menu instead of full portions.  Pay attention to your food and drink choices. Knowing the way food is cooked and what is included with the meal can help you eat fewer calories. ? If calories are listed on the menu, choose the lower-calorie options. ? Choose dishes that include vegetables, fruits, whole grains, low-fat dairy products, and lean proteins. ? Choose items that are boiled, broiled, grilled, or steamed. Avoid items that are buttered, battered, fried, or served with cream sauce. Items labeled as crispy are usually fried, unless stated otherwise. ? Choose water, low-fat milk, unsweetened iced tea, or other drinks without added sugar. If you want an alcoholic beverage, choose a lower-calorie option, such as a glass of wine or light beer. ? Ask for dressings, sauces, and syrups on the side. These are usually high in calories, so you should limit the amount you eat. ? If you want a salad, choose a garden salad and ask for grilled meats. Avoid extra toppings such as bacon, cheese, or fried items. Ask for the dressing on the side, or ask for olive oil and vinegar or lemon to use as dressing.  Estimate how many servings of a food you are given. Knowing serving sizes will help you be aware of how much food you are eating at restaurants. Where to find more information  Centers for Disease Control and Prevention: http://www.wolf.info/  U.[place]. Department of Agriculture: http://www.wilson-mendoza.org/ Summary  Calorie counting means keeping track of how many calories you eat and drink each day. If you eat fewer calories than your body needs, you should lose weight.  A healthy amount of weight to lose per week is usually 1-2 lb (0.5-0.9 kg). This usually means reducing your daily calorie intake by 500-750 calories.  The number of calories in a food can be found on a Nutrition Facts label. If a food does not have a Nutrition Facts label, try to look up the calories online or ask your dietitian for  help.  Use smaller plates, glasses, and bowls for smaller portions and to prevent overeating.  Use your  calories on foods and drinks that will fill you up and not leave you hungry shortly after a meal. This information is not intended to replace advice given to you by your health care provider. Make sure you discuss any questions you have with your health care provider. Document Revised: 04/07/2019 Document Reviewed: 04/07/2019 Elsevier Patient Education  2021 [place].   Thanks for choosing [facility], we consider it a privelige to serve you.

## 2020-03-21 ENCOUNTER — Encounter: Payer: Self-pay | Admitting: Family Medicine

## 2020-03-21 MED FILL — VIT D2 1.25 MG (50,000 UNIT: 1.25 MG | 84 days supply | Qty: 12 | Fill #1

## 2020-03-21 NOTE — Assessment & Plan Note (Signed)
Hyperlipidemia:Low fat diet discussed and encouraged.   Lipid Panel  Lab Results  Component Value Date   CHOL 146 08/11/2019   HDL 53 08/11/2019   LDLCALC 74 08/11/2019   TRIG 102 08/11/2019   CHOLHDL 2.8 08/11/2019     Updated lab needed at/ before next visit.  

## 2020-03-21 NOTE — Assessment & Plan Note (Signed)
Controlled, no change in medication  

## 2020-03-21 NOTE — Assessment & Plan Note (Signed)
  Patient re-educated about  the importance of commitment to a  minimum of 150 minutes of exercise per week as able.  The importance of healthy food choices with portion control discussed, as well as eating regularly and within a 12 hour window most days. The need to choose "clean , green" food 50 to 75% of the time is discussed, as well as to make water the primary drink and set a goal of 64 ounces water daily.    Weight /BMI 03/20/2020 01/12/2020 08/24/2019  WEIGHT 170 lb 168 lb 172 lb  HEIGHT 5\' 1"  5\' 1"  5\' 1"   BMI 32.12 kg/m2 31.74 kg/m2 32.5 kg/m2

## 2020-03-21 NOTE — Assessment & Plan Note (Signed)
Controlled, no change in medication DASH diet and commitment to daily physical activity for a minimum of 30 minutes discussed and encouraged, as a part of hypertension management. The importance of attaining a healthy weight is also discussed.  BP/Weight 03/20/2020 01/12/2020 08/24/2019 02/25/2019 02/18/2019 02/10/2019 34/09/4257  Systolic BP 563 875 643 329 518 841 660  Diastolic BP 70 80 74 65 80 74 82  Wt. (Lbs) 170 168 172 - 168.8 168.8 165  BMI 32.12 31.74 32.5 - 31.89 31.89 31.18

## 2020-03-21 NOTE — Assessment & Plan Note (Signed)
Patient educated about the importance of limiting  Carbohydrate intake , the need to commit to daily physical activity for a minimum of 30 minutes , and to commit weight loss. The fact that changes in all these areas will reduce or eliminate all together the development of diabetes is stressed.  Updated lab needed at/ before next visit.   Diabetic Labs Latest Ref Rng & Units 08/11/2019 02/22/2019 02/10/2019 08/17/2018 02/26/2018  HbA1c <5.7 % of total Hgb 5.9(H) - 6.0(H) 5.7(H) 5.6  Chol <200 mg/dL 146 - 175 149 142  HDL > OR = 50 mg/dL 53 - 53 58 49(L)  Calc LDL mg/dL (calc) 74 - 97 72 77  Triglycerides <150 mg/dL 102 - 153(H) 109 82  Creatinine 0.50 - 1.05 mg/dL 0.71 0.72 0.73 0.78 0.71   BP/Weight 03/20/2020 01/12/2020 08/24/2019 02/25/2019 02/18/2019 02/10/2019 67/10/9379  Systolic BP 017 510 258 527 782 423 536  Diastolic BP 70 80 74 65 80 74 82  Wt. (Lbs) 170 168 172 - 168.8 168.8 165  BMI 32.12 31.74 32.5 - 31.89 31.89 31.18   No flowsheet data found.

## 2020-04-05 ENCOUNTER — Other Ambulatory Visit: Payer: Self-pay | Admitting: Family Medicine

## 2020-04-05 MED FILL — SCOPOLAMINE 1 MG/3 DAY PATC: 1 | 30 days supply | Qty: 10 | Fill #0

## 2020-05-05 ENCOUNTER — Other Ambulatory Visit: Payer: Self-pay | Admitting: Family Medicine

## 2020-05-07 ENCOUNTER — Other Ambulatory Visit: Payer: Self-pay | Admitting: Family Medicine

## 2020-05-07 MED FILL — buPROPion HCL ER (XL) 150 M: 150 | 60 days supply | Qty: 60 | Fill #0

## 2020-05-15 MED FILL — ATORVASTATIN CALCIUM 20 MG: 20 | 90 days supply | Qty: 90 | Fill #3

## 2020-05-15 MED FILL — VENLAFAXINE HCL ER 150 MG C: 150 | 90 days supply | Qty: 90 | Fill #3

## 2020-05-18 ENCOUNTER — Other Ambulatory Visit: Payer: Self-pay | Admitting: Family Medicine

## 2020-05-21 ENCOUNTER — Other Ambulatory Visit: Payer: Self-pay | Admitting: Family Medicine

## 2020-05-28 ENCOUNTER — Other Ambulatory Visit: Payer: Self-pay | Admitting: Internal Medicine

## 2020-05-28 MED FILL — ADDERALL XR 30 MG CAP SA: 30 | 90 days supply | Qty: 90 | Fill #0

## 2020-05-28 NOTE — Telephone Encounter (Signed)
She had a phone visit on 03/20/20 but she didn't need refills on her adderall at that time. Now she is out. Has her next follow up early May. Can this be refilled?

## 2020-05-29 ENCOUNTER — Other Ambulatory Visit (HOSPITAL_BASED_OUTPATIENT_CLINIC_OR_DEPARTMENT_OTHER): Payer: Self-pay

## 2020-05-29 ENCOUNTER — Other Ambulatory Visit: Payer: Self-pay | Admitting: Family Medicine

## 2020-06-06 ENCOUNTER — Other Ambulatory Visit: Payer: Self-pay | Admitting: Family Medicine

## 2020-06-06 MED FILL — VIT D2 1.25 MG (50,000 UNIT: 1.25 MG | 84 days supply | Qty: 12 | Fill #0

## 2020-06-11 ENCOUNTER — Other Ambulatory Visit (HOSPITAL_COMMUNITY): Payer: Self-pay

## 2020-06-19 ENCOUNTER — Other Ambulatory Visit (HOSPITAL_COMMUNITY): Payer: Self-pay

## 2020-06-19 ENCOUNTER — Ambulatory Visit (INDEPENDENT_AMBULATORY_CARE_PROVIDER_SITE_OTHER): Payer: 59 | Admitting: Podiatry

## 2020-06-19 ENCOUNTER — Other Ambulatory Visit: Payer: Self-pay

## 2020-06-19 ENCOUNTER — Ambulatory Visit (INDEPENDENT_AMBULATORY_CARE_PROVIDER_SITE_OTHER): Payer: 59

## 2020-06-19 DIAGNOSIS — M216X1 Other acquired deformities of right foot: Secondary | ICD-10-CM | POA: Diagnosis not present

## 2020-06-19 DIAGNOSIS — M722 Plantar fascial fibromatosis: Secondary | ICD-10-CM

## 2020-06-19 DIAGNOSIS — L603 Nail dystrophy: Secondary | ICD-10-CM

## 2020-06-19 DIAGNOSIS — M21861 Other specified acquired deformities of right lower leg: Secondary | ICD-10-CM

## 2020-06-19 MED ORDER — MELOXICAM 15 MG PO TABS
15.0000 mg | ORAL_TABLET | Freq: Every day | ORAL | 3 refills | Status: DC
  Filled 2020-06-19: qty 30, 30d supply, fill #0
  Filled 2020-08-28: qty 30, 30d supply, fill #1

## 2020-06-19 NOTE — Patient Instructions (Signed)
[facility]:  [phone]   Plantar Fasciitis (Heel Spur Syndrome) with Rehab The plantar fascia is a fibrous, ligament-like, soft-tissue structure that spans the bottom of the foot. Plantar fasciitis is a condition that causes pain in the foot due to inflammation of the tissue. SYMPTOMS   Pain and tenderness on the underneath side of the foot.  Pain that worsens with standing or walking. CAUSES  Plantar fasciitis is caused by irritation and injury to the plantar fascia on the underneath side of the foot. Common mechanisms of injury include:  Direct trauma to bottom of the foot.  Damage to a small nerve that runs under the foot where the main fascia attaches to the heel bone.  Stress placed on the plantar fascia due to bone spurs. RISK INCREASES WITH:   Activities that place stress on the plantar fascia (running, jumping, pivoting, or cutting).  Poor strength and flexibility.  Improperly fitted shoes.  Tight calf muscles.  Flat feet.  Failure to warm-up properly before activity.  Obesity. PREVENTION  Warm up and stretch properly before activity.  Allow for adequate recovery between workouts.  Maintain physical fitness:  Strength, flexibility, and endurance.  Cardiovascular fitness.  Maintain a health body weight.  Avoid stress on the plantar fascia.  Wear properly fitted shoes, including arch supports for individuals who have flat feet.  PROGNOSIS  If treated properly, then the symptoms of plantar fasciitis usually resolve without surgery. However, occasionally surgery is necessary.  RELATED COMPLICATIONS   Recurrent symptoms that may result in a chronic condition.  Problems of the lower back that are caused by compensating for the injury, such as limping.  Pain or weakness of the foot during push-off following surgery.  Chronic inflammation, scarring, and partial or complete fascia tear, occurring more often from repeated injections.  TREATMENT   Treatment initially involves the use of ice and medication to help reduce pain and inflammation. The use of strengthening and stretching exercises may help reduce pain with activity, especially stretches of the Achilles tendon. These exercises may be performed at home or with a therapist. Your caregiver may recommend that you use heel cups of arch supports to help reduce stress on the plantar fascia. Occasionally, corticosteroid injections are given to reduce inflammation. If symptoms persist for greater than 6 months despite non-surgical (conservative), then surgery may be recommended.   MEDICATION   If pain medication is necessary, then nonsteroidal anti-inflammatory medications, such as aspirin and ibuprofen, or other minor pain relievers, such as acetaminophen, are often recommended.  Do not take pain medication within 7 days before surgery.  Prescription pain relievers may be given if deemed necessary by your caregiver. Use only as directed and only as much as you need.  Corticosteroid injections may be given by your caregiver. These injections should be reserved for the most serious cases, because they may only be given a certain number of times.  HEAT AND COLD  Cold treatment (icing) relieves pain and reduces inflammation. Cold treatment should be applied for 10 to 15 minutes every 2 to 3 hours for inflammation and pain and immediately after any activity that aggravates your symptoms. Use ice packs or massage the area with a piece of ice (ice massage).  Heat treatment may be used prior to performing the stretching and strengthening activities prescribed by your caregiver, physical therapist, or athletic trainer. Use a heat pack or soak the injury in warm water.  SEEK IMMEDIATE MEDICAL CARE IF:  Treatment seems to offer no benefit,  or the condition worsens.  Any medications produce adverse side effects.  EXERCISES- RANGE OF MOTION (ROM) AND STRETCHING EXERCISES - Plantar Fasciitis  (Heel Spur Syndrome) These exercises may help you when beginning to rehabilitate your injury. Your symptoms may resolve with or without further involvement from your physician, physical therapist or athletic trainer. While completing these exercises, remember:   Restoring tissue flexibility helps normal motion to return to the joints. This allows healthier, less painful movement and activity.  An effective stretch should be held for at least 30 seconds.  A stretch should never be painful. You should only feel a gentle lengthening or release in the stretched tissue.  RANGE OF MOTION - Toe Extension, Flexion  Sit with your right / left leg crossed over your opposite knee.  Grasp your toes and gently pull them back toward the top of your foot. You should feel a stretch on the bottom of your toes and/or foot.  Hold this stretch for 10 seconds.  Now, gently pull your toes toward the bottom of your foot. You should feel a stretch on the top of your toes and or foot.  Hold this stretch for 10 seconds. Repeat  times. Complete this stretch 3 times per day.   RANGE OF MOTION - Ankle Dorsiflexion, Active Assisted  Remove shoes and sit on a chair that is preferably not on a carpeted surface.  Place right / left foot under knee. Extend your opposite leg for support.  Keeping your heel down, slide your right / left foot back toward the chair until you feel a stretch at your ankle or calf. If you do not feel a stretch, slide your bottom forward to the edge of the chair, while still keeping your heel down.  Hold this stretch for 10 seconds. Repeat 3 times. Complete this stretch 2 times per day.   STRETCH  Gastroc, Standing  Place hands on wall.  Extend right / left leg, keeping the front knee somewhat bent.  Slightly point your toes inward on your back foot.  Keeping your right / left heel on the floor and your knee straight, shift your weight toward the wall, not allowing your back to  arch.  You should feel a gentle stretch in the right / left calf. Hold this position for 10 seconds. Repeat 3 times. Complete this stretch 2 times per day.  STRETCH  Soleus, Standing  Place hands on wall.  Extend right / left leg, keeping the other knee somewhat bent.  Slightly point your toes inward on your back foot.  Keep your right / left heel on the floor, bend your back knee, and slightly shift your weight over the back leg so that you feel a gentle stretch deep in your back calf.  Hold this position for 10 seconds. Repeat 3 times. Complete this stretch 2 times per day.  STRETCH  Gastrocsoleus, Standing  Note: This exercise can place a lot of stress on your foot and ankle. Please complete this exercise only if specifically instructed by your caregiver.   Place the ball of your right / left foot on a step, keeping your other foot firmly on the same step.  Hold on to the wall or a rail for balance.  Slowly lift your other foot, allowing your body weight to press your heel down over the edge of the step.  You should feel a stretch in your right / left calf.  Hold this position for 10 seconds.  Repeat this exercise with  a slight bend in your right / left knee. Repeat 3 times. Complete this stretch 2 times per day.   STRENGTHENING EXERCISES - Plantar Fasciitis (Heel Spur Syndrome)  These exercises may help you when beginning to rehabilitate your injury. They may resolve your symptoms with or without further involvement from your physician, physical therapist or athletic trainer. While completing these exercises, remember:   Muscles can gain both the endurance and the strength needed for everyday activities through controlled exercises.  Complete these exercises as instructed by your physician, physical therapist or athletic trainer. Progress the resistance and repetitions only as guided.  STRENGTH - Towel Curls  Sit in a chair positioned on a non-carpeted surface.  Place  your foot on a towel, keeping your heel on the floor.  Pull the towel toward your heel by only curling your toes. Keep your heel on the floor. Repeat 3 times. Complete this exercise 2 times per day.  STRENGTH - Ankle Inversion  Secure one end of a rubber exercise band/tubing to a fixed object (table, pole). Loop the other end around your foot just before your toes.  Place your fists between your knees. This will focus your strengthening at your ankle.  Slowly, pull your big toe up and in, making sure the band/tubing is positioned to resist the entire motion.  Hold this position for 10 seconds.  Have your muscles resist the band/tubing as it slowly pulls your foot back to the starting position. Repeat 3 times. Complete this exercises 2 times per day.  Document Released: 02/24/2005 Document Revised: 05/19/2011 Document Reviewed: 06/08/2008 [facility] Patient Information 2014 [place], [place].

## 2020-06-20 ENCOUNTER — Other Ambulatory Visit (HOSPITAL_COMMUNITY): Payer: Self-pay

## 2020-06-24 ENCOUNTER — Encounter: Payer: Self-pay | Admitting: Podiatry

## 2020-06-24 NOTE — Progress Notes (Signed)
  Subjective:  Patient ID: Carmen Mcintyre, female    DOB: 06-Feb-1966,  MRN: [id number]  Chief Complaint  Patient presents with  . Plantar Fasciitis     Plantar fasciitis   . Nail Problem    and split toenail     55 y.o. female presents with the above complaint. History confirmed with patient.  The left third toenail is split.  She has had plantar fasciitis for the last 6 months patient been trying stretching icing and night splint and a brace.  She has sharp pain on the outside of the lower foot and ankle.  Objective:  Physical Exam: warm, good capillary refill, no trophic changes or ulcerative lesions, normal DP and PT pulses and normal sensory exam. Left Foot: Dystrophic third toenail Right Foot: Sharp and palpation of plantar heel at insertion of plantar fascia, she has gastrocnemius equinus  No images are attached to the encounter.  Radiographs: X-ray of the right foot: no fracture, dislocation, swelling or degenerative changes noted and plantar calcaneal spur Assessment:   1. 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 Nail dystrophy      Plan:  Patient was evaluated and treated and all questions answered.  Discussed treatment options for nail dystrophy including partial or permanent nail avulsion,-she is to continue monitor this and see if it grows out  Discussed the etiology and treatment options for plantar fasciitis including stretching, formal physical therapy, supportive shoegears such as a running shoe or sneaker, pre fabricated orthoses, injection therapy, and oral medications. We also discussed the role of surgical treatment of this for patients who do not improve after exhausting non-surgical treatment options.   Plantar Fasciitis -XR reviewed with patient -Educated patient on stretching and icing of the affected limb -Injection delivered to the plantar fascia of the right foot. -Rx for meloxicam. Educated on use,  risks and benefits of the medication -Prescription for physical therapy sent   After sterile prep with povidone-iodine solution and alcohol, the right heel was injected with 0.5cc 2% xylocaine plain, 0.5cc 0.5% marcaine plain, 5mg  triamcinolone acetonide, and 2mg  dexamethasone was injected along  the plantar fascia at the insertion on the plantar calcaneus. The patient tolerated the procedure well without complication.   Return in about 1 month (around 07/19/2020).

## 2020-06-27 ENCOUNTER — Other Ambulatory Visit (HOSPITAL_COMMUNITY): Payer: Self-pay

## 2020-07-01 ENCOUNTER — Encounter: Payer: Self-pay | Admitting: Podiatry

## 2020-07-05 DIAGNOSIS — M722 Plantar fascial fibromatosis: Secondary | ICD-10-CM | POA: Diagnosis not present

## 2020-07-10 DIAGNOSIS — M722 Plantar fascial fibromatosis: Secondary | ICD-10-CM | POA: Diagnosis not present

## 2020-07-11 ENCOUNTER — Other Ambulatory Visit (HOSPITAL_COMMUNITY): Payer: Self-pay

## 2020-07-11 MED FILL — Tretinoin Cream 0.05%: CUTANEOUS | 30 days supply | Qty: 20 | Fill #0 | Status: CN

## 2020-07-16 ENCOUNTER — Ambulatory Visit: Payer: 59 | Admitting: Family Medicine

## 2020-07-17 ENCOUNTER — Encounter: Payer: Self-pay | Admitting: Family Medicine

## 2020-07-17 ENCOUNTER — Ambulatory Visit: Payer: 59 | Admitting: Podiatry

## 2020-07-17 ENCOUNTER — Ambulatory Visit: Payer: 59 | Admitting: Family Medicine

## 2020-07-17 ENCOUNTER — Other Ambulatory Visit: Payer: Self-pay

## 2020-07-17 VITALS — BP 120/78 | HR 104 | Temp 97.6°F | Ht 61.0 in | Wt 173.0 lb

## 2020-07-17 DIAGNOSIS — I10 Essential (primary) hypertension: Secondary | ICD-10-CM | POA: Diagnosis not present

## 2020-07-17 DIAGNOSIS — E785 Hyperlipidemia, unspecified: Secondary | ICD-10-CM | POA: Diagnosis not present

## 2020-07-17 DIAGNOSIS — Z1159 Encounter for screening for other viral diseases: Secondary | ICD-10-CM | POA: Diagnosis not present

## 2020-07-17 DIAGNOSIS — E669 Obesity, unspecified: Secondary | ICD-10-CM

## 2020-07-17 DIAGNOSIS — R7303 Prediabetes: Secondary | ICD-10-CM

## 2020-07-17 DIAGNOSIS — Z1231 Encounter for screening mammogram for malignant neoplasm of breast: Secondary | ICD-10-CM | POA: Diagnosis not present

## 2020-07-17 DIAGNOSIS — E8881 Metabolic syndrome: Secondary | ICD-10-CM | POA: Diagnosis not present

## 2020-07-17 DIAGNOSIS — F324 Major depressive disorder, single episode, in partial remission: Secondary | ICD-10-CM | POA: Diagnosis not present

## 2020-07-17 DIAGNOSIS — E559 Vitamin D deficiency, unspecified: Secondary | ICD-10-CM | POA: Diagnosis not present

## 2020-07-17 NOTE — Patient Instructions (Addendum)
F/U in 4  Months, call if you need me sooner  Weight loss goal of 5 to 10 pounds  Labs today already ordered  Meds as before  Please schedule July Mammogram  It is important that you exercise regularly at least 30 minutes 5 times a week. If you develop chest pain, have severe difficulty breathing, or feel very tired, stop exercising immediately and seek medical attention   Think about what you will eat, plan ahead. Choose " clean, green, fresh or frozen" over canned, processed or packaged foods which are more sugary, salty and fatty. 70 to 75% of food eaten should be vegetables and fruit. Three meals at set times with snacks allowed between meals, but they must be fruit or vegetables. Aim to eat over a 12 hour period , example 7 am to 7 pm, and STOP after  your last meal of the day. Drink water,generally about 64 ounces per day, no other drink is as healthy. Fruit juice is best enjoyed in a healthy way, by EATING the fruit.  Thanks for choosing [facility], we consider it a privelige to serve you.

## 2020-07-18 ENCOUNTER — Ambulatory Visit: Payer: 59 | Admitting: Family Medicine

## 2020-07-18 ENCOUNTER — Encounter: Payer: Self-pay | Admitting: Family Medicine

## 2020-07-18 ENCOUNTER — Other Ambulatory Visit: Payer: Self-pay

## 2020-07-18 DIAGNOSIS — R7303 Prediabetes: Secondary | ICD-10-CM

## 2020-07-18 DIAGNOSIS — E785 Hyperlipidemia, unspecified: Secondary | ICD-10-CM

## 2020-07-18 DIAGNOSIS — E8881 Metabolic syndrome: Secondary | ICD-10-CM

## 2020-07-18 LAB — LIPID PANEL
Chol/HDL Ratio: 2.5 ratio (ref 0.0–4.4)
Cholesterol, Total: 162 mg/dL (ref 100–199)
HDL: 64 mg/dL (ref >39)
LDL Chol Calc (NIH): 81 mg/dL (ref 0–99)
Triglycerides: 93 mg/dL (ref 0–149)
VLDL Cholesterol Cal: 17 mg/dL (ref 5–40)

## 2020-07-18 LAB — CMP14+EGFR
ALT: 20 IU/L (ref 0–32)
AST: 15 IU/L (ref 0–40)
Albumin/Globulin Ratio: 1.6 (ref 1.2–2.2)
Albumin: 4.7 g/dL (ref 3.8–4.9)
Alkaline Phosphatase: 135 IU/L — ABNORMAL HIGH (ref 44–121)
BUN/Creatinine Ratio: 18 (ref 9–23)
BUN: 15 mg/dL (ref 6–24)
Bilirubin Total: 0.4 mg/dL (ref 0.0–1.2)
CO2: 24 mmol/L (ref 20–29)
Calcium: 9.8 mg/dL (ref 8.7–10.2)
Chloride: 99 mmol/L (ref 96–106)
Creatinine, Ser: 0.84 mg/dL (ref 0.57–1.00)
Globulin, Total: 3 g/dL (ref 1.5–4.5)
Glucose: 117 mg/dL — ABNORMAL HIGH (ref 65–99)
Potassium: 4.4 mmol/L (ref 3.5–5.2)
Sodium: 138 mmol/L (ref 134–144)
Total Protein: 7.7 g/dL (ref 6.0–8.5)
eGFR: 82 mL/min/{1.73_m2} (ref >59)

## 2020-07-18 LAB — CBC
Hematocrit: 43.4 % (ref 34.0–46.6)
Hemoglobin: 14.8 g/dL (ref 11.1–15.9)
MCH: 31.5 pg (ref 26.6–33.0)
MCHC: 34.1 g/dL (ref 31.5–35.7)
MCV: 92 fL (ref 79–97)
Platelets: 301 10*3/uL (ref 150–450)
RBC: 4.7 x10E6/uL (ref 3.77–5.28)
RDW: 12.2 % (ref 11.7–15.4)
WBC: 8.4 10*3/uL (ref 3.4–10.8)

## 2020-07-18 LAB — TSH: TSH: 1.05 u[IU]/mL (ref 0.450–4.500)

## 2020-07-18 LAB — HEMOGLOBIN A1C
Est. average glucose Bld gHb Est-mCnc: 143 mg/dL
Hgb A1c MFr Bld: 6.6 % — ABNORMAL HIGH (ref 4.8–5.6)

## 2020-07-18 LAB — VITAMIN D 25 HYDROXY (VIT D DEFICIENCY, FRACTURES): Vit D, 25-Hydroxy: 53.1 ng/mL (ref 30.0–100.0)

## 2020-07-18 LAB — HEPATITIS C ANTIBODY: Hep C Virus Ab: <0.1 s/co ratio (ref 0.0–0.9)

## 2020-07-18 NOTE — Assessment & Plan Note (Signed)
Deteriorated Patient educated about the importance of limiting  Carbohydrate intake , the need to commit to daily physical activity for a minimum of 30 minutes , and to commit weight loss. The fact that changes in all these areas will reduce or eliminate all together the development of diabetes is stressed.   Diabetic Labs Latest Ref Rng & Units 07/17/2020 08/11/2019 02/22/2019 02/10/2019 08/17/2018  HbA1c 4.8 - 5.6 % 6.6(H) 5.9(H) - 6.0(H) 5.7(H)  Chol 100 - 199 mg/dL 162 146 - 175 149  HDL >39 mg/dL 64 53 - 53 58  Calc LDL 0 - 99 mg/dL 81 74 - 97 72  Triglycerides 0 - 149 mg/dL 93 102 - 153(H) 109  Creatinine 0.57 - 1.00 mg/dL 0.84 0.71 0.72 0.73 0.78   BP/Weight 07/17/2020 03/20/2020 01/12/2020 08/24/2019 02/25/2019 02/18/2019 15/0/5697  Systolic BP 948 016 553 748 270 786 754  Diastolic BP 78 70 80 74 65 80 74  Wt. (Lbs) 173 170 168 172 - 168.8 168.8  BMI 32.69 32.12 31.74 32.5 - 31.89 31.89   No flowsheet data found.

## 2020-07-18 NOTE — Assessment & Plan Note (Signed)
Controlled, no change in medication  

## 2020-07-18 NOTE — Progress Notes (Signed)
Carmen Mcintyre     MRN: [id number]      DOB: 27-Oct-1965   HPI Ms. Kops is here for follow up and re-evaluation of chronic medical conditions, medication management and review of any available recent lab and radiology data.  Preventive health is updated, specifically  Cancer screening and Immunization.   Questions or concerns regarding consultations or procedures which the PT has had in the interim are  addressed. The PT denies any advershere are no new concerns.  C/o poor food choice and   No exercise, plans to change this   ROS Denies recent fever or chills. Denies sinus pressure, nasal congestion, ear pain or sore throat. Denies chest congestion, productive cough or wheezing. Denies chest pains, palpitations and leg swelling Denies abdominal pain, nausea, vomiting,diarrhea or constipation.   Denies dysuria, frequency, hesitancy or incontinence. Denies joint pain, swelling and limitation in mobility. Denies headaches, seizures, numbness, or tingling. Denies depression, anxiety or insomnia. Denies skin break down or rash.   PE  BP 120/78   Pulse (!) 104   Temp 97.6 F (36.4 C) (Oral)   Ht 5\' 1"  (1.549 m)   Wt 173 lb (78.5 kg)   LMP 08/22/2016   SpO2 96%   BMI 32.69 kg/m   Patient alert and oriented and in no cardiopulmonary distress.  HEENT: No facial asymmetry, EOMI,     Neck supple .  Chest: Clear to auscultation bilaterally.  CVS: S1, S2 no murmurs, no S3.Regular rate.  ABD: Soft non tender.   Ext: No edema  MS: Adequate ROM spine, shoulders, hips and knees.  Skin: Intact, no ulcerations or rash noted.  Psych: Good eye contact, normal affect. Memory intact not anxious or depressed appearing.  CNS: CN 2-12 intact, power,  normal throughout.no focal deficits noted.   Assessment & Plan  HTN (hypertension) Controlled, no change in medication DASH diet and commitment to daily physical activity for a minimum of 30 minutes discussed and encouraged, as a  part of hypertension management. The importance of attaining a healthy weight is also discussed.  BP/Weight 07/17/2020 03/20/2020 01/12/2020 08/24/2019 02/25/2019 02/18/2019 03/16/5100  Systolic BP 585 277 824 235 361 443 154  Diastolic BP 78 70 80 74 65 80 74  Wt. (Lbs) 173 170 168 172 - 168.8 168.8  BMI 32.69 32.12 31.74 32.5 - 31.89 31.89       Hyperlipidemia LDL goal <70 Hyperlipidemia:Low fat diet discussed and encouraged.   Lipid Panel  Lab Results  Component Value Date   CHOL 162 07/17/2020   HDL 64 07/17/2020   LDLCALC 81 07/17/2020   TRIG 93 07/17/2020   CHOLHDL 2.5 07/17/2020   Needs lower LDL, reduce fat in diet    Depression, major, single episode, in partial remission (HCC) Controlled, no change in medication   Obesity (BMI 30.0-34.9)  Patient re-educated about  the importance of commitment to a  minimum of 150 minutes of exercise per week as able.  The importance of healthy food choices with portion control discussed, as well as eating regularly and within a 12 hour window most days. The need to choose "clean , green" food 50 to 75% of the time is discussed, as well as to make water the primary drink and set a goal of 64 ounces water daily.    Weight /BMI 07/17/2020 03/20/2020 01/12/2020  WEIGHT 173 lb 170 lb 168 lb  HEIGHT 5\' 1"  5\' 1"  5\' 1"   BMI 32.69 kg/m2 32.12 kg/m2 31.74 kg/m2  Prediabetes Deteriorated Patient educated about the importance of limiting  Carbohydrate intake , the need to commit to daily physical activity for a minimum of 30 minutes , and to commit weight loss. The fact that changes in all these areas will reduce or eliminate all together the development of diabetes is stressed.   Diabetic Labs Latest Ref Rng & Units 07/17/2020 08/11/2019 02/22/2019 02/10/2019 08/17/2018  HbA1c 4.8 - 5.6 % 6.6(H) 5.9(H) - 6.0(H) 5.7(H)  Chol 100 - 199 mg/dL 162 146 - 175 149  HDL >39 mg/dL 64 53 - 53 58  Calc LDL 0 - 99 mg/dL 81 74 - 97 72   Triglycerides 0 - 149 mg/dL 93 102 - 153(H) 109  Creatinine 0.57 - 1.00 mg/dL 0.84 0.71 0.72 0.73 0.78   BP/Weight 07/17/2020 03/20/2020 01/12/2020 08/24/2019 02/25/2019 02/18/2019 29/11/3714  Systolic BP 967 893 810 175 102 585 277  Diastolic BP 78 70 80 74 65 80 74  Wt. (Lbs) 173 170 168 172 - 168.8 168.8  BMI 32.69 32.12 31.74 32.5 - 31.89 31.89   No flowsheet data found.

## 2020-07-18 NOTE — Assessment & Plan Note (Signed)
Controlled, no change in medication DASH diet and commitment to daily physical activity for a minimum of 30 minutes discussed and encouraged, as a part of hypertension management. The importance of attaining a healthy weight is also discussed.  BP/Weight 07/17/2020 03/20/2020 01/12/2020 08/24/2019 02/25/2019 02/18/2019 54/04/7060  Systolic BP 376 283 151 761 607 371 062  Diastolic BP 78 70 80 74 65 80 74  Wt. (Lbs) 173 170 168 172 - 168.8 168.8  BMI 32.69 32.12 31.74 32.5 - 31.89 31.89

## 2020-07-18 NOTE — Assessment & Plan Note (Signed)
Hyperlipidemia:Low fat diet discussed and encouraged.   Lipid Panel  Lab Results  Component Value Date   CHOL 162 07/17/2020   HDL 64 07/17/2020   LDLCALC 81 07/17/2020   TRIG 93 07/17/2020   CHOLHDL 2.5 07/17/2020   Needs lower LDL, reduce fat in diet

## 2020-07-18 NOTE — Progress Notes (Signed)
Msg sent to patient 

## 2020-07-18 NOTE — Assessment & Plan Note (Signed)
  Patient re-educated about  the importance of commitment to a  minimum of 150 minutes of exercise per week as able.  The importance of healthy food choices with portion control discussed, as well as eating regularly and within a 12 hour window most days. The need to choose "clean , green" food 50 to 75% of the time is discussed, as well as to make water the primary drink and set a goal of 64 ounces water daily.    Weight /BMI 07/17/2020 03/20/2020 01/12/2020  WEIGHT 173 lb 170 lb 168 lb  HEIGHT 5\' 1"  5\' 1"  5\' 1"   BMI 32.69 kg/m2 32.12 kg/m2 31.74 kg/m2

## 2020-07-19 DIAGNOSIS — M722 Plantar fascial fibromatosis: Secondary | ICD-10-CM | POA: Diagnosis not present

## 2020-07-21 ENCOUNTER — Other Ambulatory Visit (HOSPITAL_COMMUNITY): Payer: Self-pay

## 2020-07-21 ENCOUNTER — Other Ambulatory Visit: Payer: Self-pay | Admitting: Family Medicine

## 2020-07-21 DIAGNOSIS — R7303 Prediabetes: Secondary | ICD-10-CM

## 2020-07-23 ENCOUNTER — Other Ambulatory Visit (HOSPITAL_COMMUNITY): Payer: Self-pay

## 2020-07-24 ENCOUNTER — Other Ambulatory Visit (HOSPITAL_COMMUNITY): Payer: Self-pay

## 2020-07-24 ENCOUNTER — Other Ambulatory Visit: Payer: Self-pay

## 2020-07-24 ENCOUNTER — Other Ambulatory Visit: Payer: Self-pay | Admitting: Family Medicine

## 2020-07-24 ENCOUNTER — Telehealth: Payer: Self-pay

## 2020-07-24 DIAGNOSIS — R7303 Prediabetes: Secondary | ICD-10-CM

## 2020-07-24 DIAGNOSIS — M722 Plantar fascial fibromatosis: Secondary | ICD-10-CM | POA: Diagnosis not present

## 2020-07-24 MED ORDER — LISINOPRIL 10 MG PO TABS
10.0000 mg | ORAL_TABLET | Freq: Every day | ORAL | 1 refills | Status: DC
  Filled 2020-07-24: qty 90, 90d supply, fill #0
  Filled 2020-10-23: qty 90, 90d supply, fill #1

## 2020-07-24 NOTE — Telephone Encounter (Signed)
Refill sent.

## 2020-07-24 NOTE — Telephone Encounter (Signed)
Patient called need med refill  lisinopril (ZESTRIL) 10 MG tablet   [place], [place] - [address]  [place], [place] [address]  Phone:  [phone] Fax:  [phone]

## 2020-07-26 DIAGNOSIS — M722 Plantar fascial fibromatosis: Secondary | ICD-10-CM | POA: Diagnosis not present

## 2020-08-01 ENCOUNTER — Other Ambulatory Visit (HOSPITAL_COMMUNITY): Payer: Self-pay

## 2020-08-01 ENCOUNTER — Encounter: Payer: Self-pay | Admitting: Family Medicine

## 2020-08-01 MED FILL — Bupropion HCl Tab ER 24HR 150 MG: ORAL | 60 days supply | Qty: 60 | Fill #0 | Status: AC

## 2020-08-02 ENCOUNTER — Other Ambulatory Visit (HOSPITAL_COMMUNITY): Payer: Self-pay

## 2020-08-02 ENCOUNTER — Other Ambulatory Visit: Payer: Self-pay | Admitting: Family Medicine

## 2020-08-02 MED ORDER — AMPHETAMINE-DEXTROAMPHETAMINE 10 MG PO TABS
ORAL_TABLET | ORAL | 0 refills | Status: DC
  Filled 2020-11-02: qty 90, 90d supply, fill #0

## 2020-08-02 MED ORDER — AMPHETAMINE-DEXTROAMPHETAMINE 10 MG PO TABS
ORAL_TABLET | ORAL | 0 refills | Status: DC
  Filled 2020-08-02: qty 90, 90d supply, fill #0

## 2020-08-07 DIAGNOSIS — M722 Plantar fascial fibromatosis: Secondary | ICD-10-CM | POA: Diagnosis not present

## 2020-08-14 DIAGNOSIS — M722 Plantar fascial fibromatosis: Secondary | ICD-10-CM | POA: Diagnosis not present

## 2020-08-17 DIAGNOSIS — M722 Plantar fascial fibromatosis: Secondary | ICD-10-CM | POA: Diagnosis not present

## 2020-08-28 ENCOUNTER — Other Ambulatory Visit: Payer: Self-pay | Admitting: Family Medicine

## 2020-08-28 ENCOUNTER — Other Ambulatory Visit (HOSPITAL_COMMUNITY): Payer: Self-pay

## 2020-08-28 MED ORDER — ATORVASTATIN CALCIUM 20 MG PO TABS
ORAL_TABLET | Freq: Every day | ORAL | 3 refills | Status: DC
  Filled 2020-08-28: qty 90, 90d supply, fill #0
  Filled 2021-01-21: qty 90, 90d supply, fill #1
  Filled 2021-04-29: qty 90, 90d supply, fill #2
  Filled 2021-07-30: qty 90, 90d supply, fill #3

## 2020-08-29 ENCOUNTER — Other Ambulatory Visit (HOSPITAL_COMMUNITY): Payer: Self-pay

## 2020-08-31 ENCOUNTER — Other Ambulatory Visit: Payer: Self-pay | Admitting: Family Medicine

## 2020-08-31 ENCOUNTER — Other Ambulatory Visit (HOSPITAL_COMMUNITY): Payer: Self-pay

## 2020-09-04 ENCOUNTER — Other Ambulatory Visit: Payer: Self-pay | Admitting: Family Medicine

## 2020-09-04 ENCOUNTER — Other Ambulatory Visit (HOSPITAL_COMMUNITY): Payer: Self-pay

## 2020-09-05 ENCOUNTER — Other Ambulatory Visit (HOSPITAL_COMMUNITY): Payer: Self-pay

## 2020-09-06 ENCOUNTER — Other Ambulatory Visit: Payer: Self-pay | Admitting: Family Medicine

## 2020-09-06 ENCOUNTER — Other Ambulatory Visit (HOSPITAL_COMMUNITY): Payer: Self-pay

## 2020-09-12 ENCOUNTER — Other Ambulatory Visit (HOSPITAL_COMMUNITY): Payer: Self-pay

## 2020-10-15 ENCOUNTER — Other Ambulatory Visit (HOSPITAL_BASED_OUTPATIENT_CLINIC_OR_DEPARTMENT_OTHER): Payer: Self-pay

## 2020-10-23 ENCOUNTER — Other Ambulatory Visit (HOSPITAL_COMMUNITY): Payer: Self-pay

## 2020-11-01 ENCOUNTER — Other Ambulatory Visit (HOSPITAL_COMMUNITY): Payer: Self-pay

## 2020-11-02 ENCOUNTER — Other Ambulatory Visit: Payer: Self-pay | Admitting: Family Medicine

## 2020-11-02 ENCOUNTER — Other Ambulatory Visit (HOSPITAL_COMMUNITY): Payer: Self-pay

## 2020-11-05 ENCOUNTER — Telehealth: Payer: Self-pay | Admitting: Family Medicine

## 2020-11-05 ENCOUNTER — Encounter: Payer: Self-pay | Admitting: Family Medicine

## 2020-11-05 ENCOUNTER — Other Ambulatory Visit (HOSPITAL_COMMUNITY): Payer: Self-pay

## 2020-11-05 ENCOUNTER — Other Ambulatory Visit: Payer: Self-pay | Admitting: Family Medicine

## 2020-11-05 MED ORDER — AMPHETAMINE-DEXTROAMPHET ER 30 MG PO CP24
30.0000 mg | ORAL_CAPSULE | Freq: Every day | ORAL | 0 refills | Status: DC
  Filled 2020-11-05: qty 90, 90d supply, fill #0

## 2020-11-05 NOTE — Telephone Encounter (Signed)
Pt needs 30 mg called in, to [facility]

## 2020-11-06 ENCOUNTER — Other Ambulatory Visit (HOSPITAL_COMMUNITY): Payer: Self-pay

## 2020-11-20 ENCOUNTER — Ambulatory Visit
Admission: RE | Admit: 2020-11-20 | Discharge: 2020-11-20 | Disposition: A | Discharge status: Home / Self Care | Payer: 59 | Source: Ambulatory Visit | Attending: Family Medicine | Admitting: Family Medicine

## 2020-11-20 ENCOUNTER — Other Ambulatory Visit (HOSPITAL_COMMUNITY): Payer: Self-pay

## 2020-11-20 ENCOUNTER — Other Ambulatory Visit: Payer: Self-pay

## 2020-11-20 ENCOUNTER — Encounter: Payer: Self-pay | Admitting: Family Medicine

## 2020-11-20 ENCOUNTER — Ambulatory Visit: Payer: 59 | Admitting: Family Medicine

## 2020-11-20 VITALS — BP 131/79 | HR 88 | Temp 98.5°F | Resp 18 | Ht 61.0 in | Wt 173.1 lb

## 2020-11-20 DIAGNOSIS — E8881 Metabolic syndrome: Secondary | ICD-10-CM | POA: Diagnosis not present

## 2020-11-20 DIAGNOSIS — R5382 Chronic fatigue, unspecified: Secondary | ICD-10-CM | POA: Diagnosis not present

## 2020-11-20 DIAGNOSIS — G9332 Myalgic encephalomyelitis/chronic fatigue syndrome: Secondary | ICD-10-CM

## 2020-11-20 DIAGNOSIS — I10 Essential (primary) hypertension: Secondary | ICD-10-CM | POA: Diagnosis not present

## 2020-11-20 DIAGNOSIS — E785 Hyperlipidemia, unspecified: Secondary | ICD-10-CM

## 2020-11-20 DIAGNOSIS — E669 Obesity, unspecified: Secondary | ICD-10-CM

## 2020-11-20 DIAGNOSIS — R7303 Prediabetes: Secondary | ICD-10-CM | POA: Diagnosis not present

## 2020-11-20 DIAGNOSIS — F324 Major depressive disorder, single episode, in partial remission: Secondary | ICD-10-CM | POA: Diagnosis not present

## 2020-11-20 DIAGNOSIS — Z1231 Encounter for screening mammogram for malignant neoplasm of breast: Secondary | ICD-10-CM | POA: Diagnosis not present

## 2020-11-20 MED ORDER — AMPHETAMINE-DEXTROAMPHET ER 30 MG PO CP24: 30.0000 mg | ORAL_CAPSULE | ORAL | 0 refills | Status: DC

## 2020-11-20 NOTE — Assessment & Plan Note (Signed)
  Patient re-educated about  the importance of commitment to a  minimum of 150 minutes of exercise per week as able.  The importance of healthy food choices with portion control discussed, as well as eating regularly and within a 12 hour window most days. The need to choose "clean , green" food 50 to 75% of the time is discussed, as well as to make water the primary drink and set a goal of 64 ounces water daily.    Weight /BMI 11/20/2020 07/17/2020 03/20/2020  WEIGHT 173 lb 1.9 oz 173 lb 170 lb  HEIGHT '5\' 1"'$  '5\' 1"'$  '5\' 1"'$   BMI 32.71 kg/m2 32.69 kg/m2 32.12 kg/m2

## 2020-11-20 NOTE — Patient Instructions (Addendum)
Follow-up in 12 weeks call if you need me before.  You will be contacted re lab work today with medication changes as we discussed.    It is important that you exercise regularly at least 30 minutes 5 times a week. If you develop chest pain, have severe difficulty breathing, or feel very tired, stop exercising immediately and seek medical attention    Think about what you will eat, plan ahead. Choose " clean, green, fresh or frozen" over canned, processed or packaged foods which are more sugary, salty and fatty. 70 to 75% of food eaten should be vegetables and fruit. Three meals at set times with snacks allowed between meals, but they must be fruit or vegetables. Aim to eat over a 12 hour period , example 7 am to 7 pm, and STOP after  your last meal of the day. Drink water,generally about 64 ounces per day, no other drink is as healthy. Fruit juice is best enjoyed in a healthy way, by EATING the fruit.   Thanks for choosing [facility], we consider it a privelige to serve you.

## 2020-11-20 NOTE — Assessment & Plan Note (Signed)
Hyperlipidemia:Low fat diet discussed and encouraged.   Lipid Panel  Lab Results  Component Value Date   CHOL 162 07/17/2020   HDL 64 07/17/2020   LDLCALC 81 07/17/2020   TRIG 93 07/17/2020   CHOLHDL 2.5 07/17/2020     Updated lab today

## 2020-11-20 NOTE — Assessment & Plan Note (Signed)
Unchanged, continue adder all as before

## 2020-11-20 NOTE — Progress Notes (Signed)
Carmen Mcintyre     MRN: [id number]      DOB: 07/20/1965   HPI Ms. Elzey is here for follow up and re-evaluation of chronic medical conditions, medication management and review of any available recent lab and radiology data.  Preventive health is updated, specifically  Cancer screening and Immunization.   Questions or concerns regarding consultations or procedures which the PT has had in the interim are  addressed. The PT denies any adverse reactions to current medications since the last visit.  There are no new concerns.  There are no specific complaints   ROS Denies recent fever or chills. Denies sinus pressure, nasal congestion, ear pain or sore throat. Denies chest congestion, productive cough or wheezing. Denies chest pains, palpitations and leg swelling Denies abdominal pain, nausea, vomiting,diarrhea or constipation.   Denies dysuria, frequency, hesitancy or incontinence. . Denies headaches, seizures, numbness, or tingling. Denies depression, anxiety or insomnia. Denies skin break down or rash.   PE  BP 131/79   Pulse 88   Temp 98.5 F (36.9 C)   Resp 18   Ht '5\' 1"'$  (1.549 m)   Wt 173 lb 1.9 oz (78.5 kg)   LMP 08/22/2016   SpO2 95%   BMI 32.71 kg/m   Patient alert and oriented and in no cardiopulmonary distress.  HEENT: No facial asymmetry, EOMI,     Neck supple .  Chest: Clear to auscultation bilaterally.  CVS: S1, S2 no murmurs, no S3.Regular rate.  ABD: Soft non tender.   Ext: No edema  MS: Adequate ROM spine, shoulders, hips and knees.  Skin: Intact, no ulcerations or rash noted.  Psych: Good eye contact, normal affect. Memory intact not anxious or depressed appearing.  CNS: CN 2-12 intact, power,  normal throughout.no focal deficits noted.   Assessment & Plan  HTN (hypertension) Controlled, no change in medication DASH diet and commitment to daily physical activity for a minimum of 30 minutes discussed and encouraged, as a part of hypertension  management. The importance of attaining a healthy weight is also discussed.  BP/Weight 11/20/2020 07/17/2020 03/20/2020 01/12/2020 08/24/2019 02/25/2019 Q000111Q  Systolic BP A999333 123456 [id number] 123456 0000000 Q000111Q  Diastolic BP 79 78 70 80 74 65 80  Wt. (Lbs) 173.12 173 170 168 172 - 168.8  BMI 32.71 32.69 32.12 31.74 32.5 - 31.89       Chronic fatigue disorder Unchanged, continue adder all as before  Depression, major, single episode, in partial remission (HCC) Controlled, no change in medication   Hyperlipidemia LDL goal <70 Hyperlipidemia:Low fat diet discussed and encouraged.   Lipid Panel  Lab Results  Component Value Date   CHOL 162 07/17/2020   HDL 64 07/17/2020   LDLCALC 81 07/17/2020   TRIG 93 07/17/2020   CHOLHDL 2.5 07/17/2020     Updated lab today   Obesity (BMI 30.0-34.9)  Patient re-educated about  the importance of commitment to a  minimum of 150 minutes of exercise per week as able.  The importance of healthy food choices with portion control discussed, as well as eating regularly and within a 12 hour window most days. The need to choose "clean , green" food 50 to 75% of the time is discussed, as well as to make water the primary drink and set a goal of 64 ounces water daily.    Weight /BMI 11/20/2020 07/17/2020 03/20/2020  WEIGHT 173 lb 1.9 oz 173 lb 170 lb  HEIGHT '5\' 1"'$  '5\' 1"'$  '5\' 1"'$   BMI  32.71 kg/m2 32.69 kg/m2 32.12 kg/m2      Prediabetes Updated lab today.

## 2020-11-20 NOTE — Assessment & Plan Note (Signed)
Updated lab today.

## 2020-11-20 NOTE — Progress Notes (Signed)
Carmen Mcintyre     MRN: [id number]      DOB: May 20, 1965   HPI Carmen Mcintyre is here for follow up and re-evaluation of chronic medical conditions, medication management and review of any available recent lab and radiology data.  Preventive health is updated, specifically  Cancer screening and Immunization.   Is interested in treating diabetes with medication.reports wellbutrin made her tired so she stopped itdenies any adverse reactions to current medications since the last visit.  There are no new concerns.  There are no specific complaints   ROS Denies recent fever or chills. Denies sinus pressure, nasal congestion, ear pain or sore throat. Denies chest congestion, productive cough or wheezing. Denies chest pains, palpitations and leg swelling Denies abdominal pain, nausea, vomiting,diarrhea or constipation.   Denies dysuria, frequency, hesitancy or incontinence. Mild left hip pain , and new bilateral hand pain and stiffness intermittently at MP joints, no finger deformity Denies headaches, seizures, numbness, or tingling. Denies depression, anxiety or insomnia. Denies skin break down or rash.   PE  BP 131/79   Pulse 88   Temp 98.5 F (36.9 C)   Resp 18   Ht '5\' 1"'$  (1.549 m)   Wt 173 lb 1.9 oz (78.5 kg)   LMP 08/22/2016   SpO2 95%   BMI 32.71 kg/m   Patient alert and oriented and in no cardiopulmonary distress.  HEENT: No facial asymmetry, EOMI,     Neck supple .  Chest: Clear to auscultation bilaterally.  CVS: S1, S2 no murmurs, no S3.Regular rate.  ABD: Soft non tender.   Ext: No edema  MS: Adequate ROM spine, shoulders, hips and knees.  Skin: Intact, no ulcerations or rash noted.  Psych: Good eye contact, normal affect. Memory intact not anxious or depressed appearing.  CNS: CN 2-12 intact, power,  normal throughout.no focal deficits noted.   Assessment & Plan  HTN (hypertension) Controlled, no change in medication DASH diet and commitment to daily  physical activity for a minimum of 30 minutes discussed and encouraged, as a part of hypertension management. The importance of attaining a healthy weight is also discussed.  BP/Weight 11/20/2020 07/17/2020 03/20/2020 01/12/2020 08/24/2019 02/25/2019 Q000111Q  Systolic BP A999333 123456 [id number] 123456 0000000 Q000111Q  Diastolic BP 79 78 70 80 74 65 80  Wt. (Lbs) 173.12 173 170 168 172 - 168.8  BMI 32.71 32.69 32.12 31.74 32.5 - 31.89       Chronic fatigue disorder Unchanged, continue adder all as before  Depression, major, single episode, in partial remission (HCC) Controlled, no change in medication   Hyperlipidemia LDL goal <70 Hyperlipidemia:Low fat diet discussed and encouraged.   Lipid Panel  Lab Results  Component Value Date   CHOL 162 07/17/2020   HDL 64 07/17/2020   LDLCALC 81 07/17/2020   TRIG 93 07/17/2020   CHOLHDL 2.5 07/17/2020     Updated lab today   Obesity (BMI 30.0-34.9)  Patient re-educated about  the importance of commitment to a  minimum of 150 minutes of exercise per week as able.  The importance of healthy food choices with portion control discussed, as well as eating regularly and within a 12 hour window most days. The need to choose "clean , green" food 50 to 75% of the time is discussed, as well as to make water the primary drink and set a goal of 64 ounces water daily.    Weight /BMI 11/20/2020 07/17/2020 03/20/2020  WEIGHT 173 lb 1.9 oz 173 lb  170 lb  HEIGHT '5\' 1"'$  '5\' 1"'$  '5\' 1"'$   BMI 32.71 kg/m2 32.69 kg/m2 32.12 kg/m2      Prediabetes Updated lab today.

## 2020-11-20 NOTE — Assessment & Plan Note (Signed)
Controlled, no change in medication DASH diet and commitment to daily physical activity for a minimum of 30 minutes discussed and encouraged, as a part of hypertension management. The importance of attaining a healthy weight is also discussed.  BP/Weight 11/20/2020 07/17/2020 03/20/2020 01/12/2020 08/24/2019 02/25/2019 Q000111Q  Systolic BP A999333 123456 [id number] 123456 0000000 Q000111Q  Diastolic BP 79 78 70 80 74 65 80  Wt. (Lbs) 173.12 173 170 168 172 - 168.8  BMI 32.71 32.69 32.12 31.74 32.5 - 31.89

## 2020-11-20 NOTE — Assessment & Plan Note (Signed)
Controlled, no change in medication  

## 2020-11-21 ENCOUNTER — Other Ambulatory Visit (HOSPITAL_COMMUNITY): Payer: Self-pay

## 2020-11-21 ENCOUNTER — Other Ambulatory Visit: Payer: Self-pay | Admitting: Family Medicine

## 2020-11-21 DIAGNOSIS — E1159 Type 2 diabetes mellitus with other circulatory complications: Secondary | ICD-10-CM

## 2020-11-21 LAB — HEMOGLOBIN A1C
Est. average glucose Bld gHb Est-mCnc: 146 mg/dL
Hgb A1c MFr Bld: 6.7 % — ABNORMAL HIGH (ref 4.8–5.6)

## 2020-11-21 LAB — CMP14+EGFR
ALT: 22 IU/L (ref 0–32)
AST: 20 IU/L (ref 0–40)
Albumin/Globulin Ratio: 1.7 (ref 1.2–2.2)
Albumin: 4.7 g/dL (ref 3.8–4.9)
Alkaline Phosphatase: 117 IU/L (ref 44–121)
BUN/Creatinine Ratio: 24 — ABNORMAL HIGH (ref 9–23)
BUN: 19 mg/dL (ref 6–24)
Bilirubin Total: 0.4 mg/dL (ref 0.0–1.2)
CO2: 22 mmol/L (ref 20–29)
Calcium: 9.6 mg/dL (ref 8.7–10.2)
Chloride: 100 mmol/L (ref 96–106)
Creatinine, Ser: 0.8 mg/dL (ref 0.57–1.00)
Globulin, Total: 2.7 g/dL (ref 1.5–4.5)
Glucose: 148 mg/dL — ABNORMAL HIGH (ref 65–99)
Potassium: 4.7 mmol/L (ref 3.5–5.2)
Sodium: 139 mmol/L (ref 134–144)
Total Protein: 7.4 g/dL (ref 6.0–8.5)
eGFR: 87 mL/min/{1.73_m2} (ref >59)

## 2020-11-21 LAB — LIPID PANEL
Chol/HDL Ratio: 3.2 ratio (ref 0.0–4.4)
Cholesterol, Total: 153 mg/dL (ref 100–199)
HDL: 48 mg/dL (ref >39)
LDL Chol Calc (NIH): 81 mg/dL (ref 0–99)
Triglycerides: 138 mg/dL (ref 0–149)
VLDL Cholesterol Cal: 24 mg/dL (ref 5–40)

## 2020-11-21 MED ORDER — TIRZEPATIDE 2.5 MG/0.5ML ~~LOC~~ SOAJ
2.5000 mg | SUBCUTANEOUS | 0 refills | Status: AC
  Filled 2020-11-21: qty 2, 28d supply, fill #0

## 2020-11-26 ENCOUNTER — Other Ambulatory Visit (HOSPITAL_COMMUNITY): Payer: Self-pay

## 2020-12-04 ENCOUNTER — Other Ambulatory Visit: Payer: Self-pay

## 2020-12-04 ENCOUNTER — Ambulatory Visit (INDEPENDENT_AMBULATORY_CARE_PROVIDER_SITE_OTHER): Payer: 59 | Admitting: Podiatry

## 2020-12-04 ENCOUNTER — Other Ambulatory Visit (HOSPITAL_COMMUNITY): Payer: Self-pay

## 2020-12-04 DIAGNOSIS — M7671 Peroneal tendinitis, right leg: Secondary | ICD-10-CM | POA: Diagnosis not present

## 2020-12-04 DIAGNOSIS — M7661 Achilles tendinitis, right leg: Secondary | ICD-10-CM

## 2020-12-04 DIAGNOSIS — M722 Plantar fascial fibromatosis: Secondary | ICD-10-CM

## 2020-12-04 MED ORDER — METHYLPREDNISOLONE 4 MG PO TBPK
ORAL_TABLET | ORAL | 0 refills | Status: DC
  Filled 2020-12-04: qty 21, 6d supply, fill #0

## 2020-12-05 NOTE — Progress Notes (Signed)
  Subjective:  Patient ID: Carmen Mcintyre, female    DOB: [birth year],  MRN: [id number]  Chief Complaint  Patient presents with   Plantar Fasciitis    Follow up bilateral    55 y.o. female presents with the above complaint. History confirmed with patient.  Heel pain originally began in late 2021.  I last saw her in April of this year and performed an injection which helped somewhat but then the pain returned.  Has not improved by physical therapy at home.  Has posterior heel pain and lateral ankle pain now as well  Objective:  Physical Exam: warm, good capillary refill, no trophic changes or ulcerative lesions, normal DP and PT pulses and normal sensory exam. Left Foot: Dystrophic third toenail Right Foot: Sharp and palpation of plantar heel at insertion of plantar fascia, she has gastrocnemius equinus, there are sharp pain on the insertion of the Achilles tendon posteriorly and at the base of the fifth metatarsal with the insertion of the peroneus brevis  No images are attached to the encounter.  Radiographs: X-ray of the right foot and ankle: no fracture, dislocation, swelling or degenerative changes noted and plantar calcaneal spur Assessment:   1. Tendonitis, Achilles, right   2. Peroneal tendinitis, right   3. Plantar fasciitis of right foot      Plan:  Patient was evaluated and treated and all questions answered.   Discussed the etiology and treatment options for plantar fasciitis Achilles tendinitis and peroneal tendinitis including stretching, formal physical therapy, supportive shoegears such as a running shoe or sneaker, pre fabricated orthoses, injection therapy, and oral medications. We also discussed the role of surgical treatment of this for patients who do not improve after exhausting non-surgical treatment options.   At this point has been going on for nearly a year with your heel pain and she has new posterior heel and lateral ankle pain as well.  X-rays were  unrevealing.  She is unimproved despite physical therapy and injections and anti-inflammatories.  I prescribed her methylprednisolone taper and place her into a cam boot for mobilization.  I think it be best to order an MRI to evaluate the integrity of all tendons and the possibility of tears and for any possible surgical procedural planning for further treatment   No follow-ups on file.

## 2020-12-06 ENCOUNTER — Other Ambulatory Visit (HOSPITAL_COMMUNITY): Payer: Self-pay

## 2020-12-06 ENCOUNTER — Other Ambulatory Visit: Payer: Self-pay | Admitting: Family Medicine

## 2020-12-07 ENCOUNTER — Other Ambulatory Visit (HOSPITAL_COMMUNITY): Payer: Self-pay

## 2020-12-07 MED ORDER — VENLAFAXINE HCL ER 150 MG PO CP24
ORAL_CAPSULE | Freq: Every day | ORAL | 3 refills | Status: DC
  Filled 2020-12-07: qty 90, 90d supply, fill #0
  Filled 2021-04-22: qty 90, 90d supply, fill #1

## 2020-12-11 ENCOUNTER — Other Ambulatory Visit: Payer: Self-pay | Admitting: Family Medicine

## 2020-12-11 ENCOUNTER — Other Ambulatory Visit (HOSPITAL_COMMUNITY): Payer: Self-pay

## 2020-12-11 ENCOUNTER — Encounter: Payer: Self-pay | Admitting: Family Medicine

## 2020-12-11 MED ORDER — ONDANSETRON HCL 4 MG PO TABS
ORAL_TABLET | ORAL | 1 refills | Status: DC
  Filled 2020-12-11: qty 30, 30d supply, fill #0

## 2020-12-25 ENCOUNTER — Other Ambulatory Visit: Payer: Self-pay

## 2020-12-25 ENCOUNTER — Ambulatory Visit
Admission: RE | Admit: 2020-12-25 | Discharge: 2020-12-25 | Disposition: A | Discharge status: Home / Self Care | Payer: 59 | Source: Ambulatory Visit | Attending: Podiatry | Admitting: Podiatry

## 2020-12-25 DIAGNOSIS — M722 Plantar fascial fibromatosis: Secondary | ICD-10-CM

## 2020-12-25 DIAGNOSIS — R600 Localized edema: Secondary | ICD-10-CM | POA: Diagnosis not present

## 2020-12-25 DIAGNOSIS — M7671 Peroneal tendinitis, right leg: Secondary | ICD-10-CM

## 2020-12-25 DIAGNOSIS — M7661 Achilles tendinitis, right leg: Secondary | ICD-10-CM

## 2020-12-25 DIAGNOSIS — M7731 Calcaneal spur, right foot: Secondary | ICD-10-CM | POA: Diagnosis not present

## 2020-12-28 ENCOUNTER — Encounter: Payer: Self-pay | Admitting: Family Medicine

## 2020-12-28 ENCOUNTER — Other Ambulatory Visit: Payer: Self-pay | Admitting: Family Medicine

## 2020-12-28 ENCOUNTER — Other Ambulatory Visit (HOSPITAL_COMMUNITY): Payer: Self-pay

## 2020-12-28 MED ORDER — TIRZEPATIDE 2.5 MG/0.5ML ~~LOC~~ SOAJ
2.5000 mg | SUBCUTANEOUS | 0 refills | Status: DC
  Filled 2020-12-28: qty 2, 28d supply, fill #0

## 2021-01-01 ENCOUNTER — Ambulatory Visit (INDEPENDENT_AMBULATORY_CARE_PROVIDER_SITE_OTHER): Payer: 59 | Admitting: Podiatry

## 2021-01-01 ENCOUNTER — Other Ambulatory Visit: Payer: Self-pay

## 2021-01-01 DIAGNOSIS — M7661 Achilles tendinitis, right leg: Secondary | ICD-10-CM | POA: Diagnosis not present

## 2021-01-01 DIAGNOSIS — M7671 Peroneal tendinitis, right leg: Secondary | ICD-10-CM | POA: Diagnosis not present

## 2021-01-01 DIAGNOSIS — M722 Plantar fascial fibromatosis: Secondary | ICD-10-CM | POA: Diagnosis not present

## 2021-01-04 ENCOUNTER — Encounter: Payer: Self-pay | Admitting: Family Medicine

## 2021-01-04 ENCOUNTER — Ambulatory Visit: Payer: 59 | Admitting: Family Medicine

## 2021-01-04 ENCOUNTER — Other Ambulatory Visit: Payer: Self-pay

## 2021-01-04 ENCOUNTER — Telehealth: Payer: Self-pay | Admitting: Urology

## 2021-01-04 VITALS — BP 117/72 | HR 95 | Resp 17 | Ht 61.0 in | Wt 165.1 lb

## 2021-01-04 DIAGNOSIS — Z23 Encounter for immunization: Secondary | ICD-10-CM

## 2021-01-04 DIAGNOSIS — E669 Obesity, unspecified: Secondary | ICD-10-CM | POA: Diagnosis not present

## 2021-01-04 DIAGNOSIS — R9402 Abnormal brain scan: Secondary | ICD-10-CM | POA: Diagnosis not present

## 2021-01-04 DIAGNOSIS — Z8673 Personal history of transient ischemic attack (TIA), and cerebral infarction without residual deficits: Secondary | ICD-10-CM | POA: Diagnosis not present

## 2021-01-04 DIAGNOSIS — F419 Anxiety disorder, unspecified: Secondary | ICD-10-CM

## 2021-01-04 DIAGNOSIS — I10 Essential (primary) hypertension: Secondary | ICD-10-CM

## 2021-01-04 DIAGNOSIS — F324 Major depressive disorder, single episode, in partial remission: Secondary | ICD-10-CM | POA: Diagnosis not present

## 2021-01-04 DIAGNOSIS — E785 Hyperlipidemia, unspecified: Secondary | ICD-10-CM

## 2021-01-04 DIAGNOSIS — G9332 Myalgic encephalomyelitis/chronic fatigue syndrome: Secondary | ICD-10-CM

## 2021-01-04 DIAGNOSIS — E1159 Type 2 diabetes mellitus with other circulatory complications: Secondary | ICD-10-CM

## 2021-01-04 NOTE — Patient Instructions (Addendum)
F/U in end January, call if you need me sooner  Pneumonia 20 vaccine today  Please get covid vaccine  You may stop aspirin for 2 weeks  Congrats on weight loss , keep it up  Best for upcoming right foot surgery  HBA1C and chem 7 and EGFR , Dec 14 or after   You are referred for brain scan  No med changes  Thanks for choosing [facility], we consider it a privelige to serve you.

## 2021-01-04 NOTE — Telephone Encounter (Signed)
DOS - 01/18/21  EPF RIGHT --- 91068  [facility] EFFECTIVE DATE - 03/10/20  PLAN DEDUCTIBLE - This does not apply to your plan  OUT OF POCKET - $4000.00 W/ $2,230.22 REMAINING COINSURANCE - 40% COPAY - $0.00   SPOKE WITH AMARRI WITH [employer] AND SHE STATED THAT FOR CPT CODE 16619 NO PRIOR AUTH IS REQUIRED.  REF # [id number]

## 2021-01-06 ENCOUNTER — Encounter: Payer: Self-pay | Admitting: Family Medicine

## 2021-01-06 NOTE — Assessment & Plan Note (Signed)
Controlled, no change in medication  

## 2021-01-06 NOTE — Assessment & Plan Note (Signed)
Meds reviewed and will be updated

## 2021-01-06 NOTE — Assessment & Plan Note (Signed)
Improved. Pt applauded on succesful weight loss through lifestyle change, and encouraged to continue same. Weight loss goal set for the next several months.  

## 2021-01-06 NOTE — Assessment & Plan Note (Signed)
Ms. Deguire is reminded of the importance of commitment to daily physical activity for 30 minutes or more, as able and the need to limit carbohydrate intake to 30 to 60 grams per meal to help with blood sugar control.   The need to take medication as prescribed, test blood sugar as directed, and to call between visits if there is a concern that blood sugar is uncontrolled is also discussed.   Ms. Gilroy is reminded of the importance of daily foot exam, annual eye examination, and good blood sugar, blood pressure and cholesterol control.  Diabetic Labs Latest Ref Rng & Units 11/20/2020 07/17/2020 08/11/2019 02/22/2019 02/10/2019  HbA1c 4.8 - 5.6 % 6.7(H) 6.6(H) 5.9(H) - 6.0(H)  Chol 100 - 199 mg/dL 153 162 146 - 175  HDL >39 mg/dL 48 64 53 - 53  Calc LDL 0 - 99 mg/dL 81 81 74 - 97  Triglycerides 0 - 149 mg/dL 138 93 102 - 153(H)  Creatinine 0.57 - 1.00 mg/dL 0.80 0.84 0.71 0.72 0.73   BP/Weight 01/04/2021 11/20/2020 07/17/2020 03/20/2020 01/12/2020 08/24/2019 73/95/8441  Systolic BP 712 787 183 672 550 016 429  Diastolic BP 72 79 78 70 80 74 65  Wt. (Lbs) 165.12 173.12 173 170 168 172 -  BMI 31.2 32.71 32.69 32.12 31.74 32.5 -   No flowsheet data found.

## 2021-01-06 NOTE — Assessment & Plan Note (Signed)
Controlled, no change in medication DASH diet and commitment to daily physical activity for a minimum of 30 minutes discussed and encouraged, as a part of hypertension management. The importance of attaining a healthy weight is also discussed.  BP/Weight 01/04/2021 11/20/2020 07/17/2020 03/20/2020 01/12/2020 08/24/2019 78/24/2353  Systolic BP 614 431 540 086 761 950 932  Diastolic BP 72 79 78 70 80 74 65  Wt. (Lbs) 165.12 173.12 173 170 168 172 -  BMI 31.2 32.71 32.69 32.12 31.74 32.5 -

## 2021-01-06 NOTE — Assessment & Plan Note (Signed)
Hyperlipidemia:Low fat diet discussed and encouraged.   Lipid Panel  Lab Results  Component Value Date   CHOL 153 11/20/2020   HDL 48 11/20/2020   LDLCALC 81 11/20/2020   TRIG 138 11/20/2020   CHOLHDL 3.2 11/20/2020  not at goal,  Updated lab needed at/ before next visit.

## 2021-01-06 NOTE — Assessment & Plan Note (Signed)
Repeat/update brain scan

## 2021-01-06 NOTE — Progress Notes (Signed)
Carmen Mcintyre     MRN: [id number]      DOB: 08-19-1965   HPI Carmen Mcintyre is here for follow up and re-evaluation of chronic medical conditions, medication management and review of any available recent lab and radiology data.  Preventive health is updated, specifically  Cancer screening and Immunization.   Has upcoming right foot surgery, I have advised it is  OK for her to stop aspirin for 2 weeks peri op Doing well on mounjaro, just a lot of nausea the first  day after injection happy with weight loss and exercising more regularly, feels better overall The PT denies any adverse reactions to current medications since the last visit.  Requests repeat brain scan as at one time there was a question of MS also remote CVa  ROS Denies recent fever or chills. Denies sinus pressure, nasal congestion, ear pain or sore throat. Denies chest congestion, productive cough or wheezing. Denies chest pains, palpitations and leg swelling Denies abdominal pain, nausea, vomiting,diarrhea or constipation.   Denies dysuria, frequency, hesitancy or incontinence. Denies joint pain, swelling and limitation in mobility. Denies headaches, seizures, numbness, or tingling. Denies uncontrolleddepression, anxiety or insomnia. Denies skin break down or rash.   PE  BP 117/72   Pulse 95   Resp 17   Ht 5\' 1"  (1.549 m)   Wt 165 lb 1.9 oz (74.9 kg)   LMP 08/22/2016   SpO2 95%   BMI 31.20 kg/m   Patient alert and oriented and in no cardiopulmonary distress.  HEENT: No facial asymmetry, EOMI,     Neck supple .  Chest: Clear to auscultation bilaterally.  CVS: S1, S2 no murmurs, no S3.Regular rate.  ABD: Soft non tender.   Ext: No edema  MS: Adequate ROM spine, shoulders, hips and knees.  Skin: Intact, no ulcerations or rash noted.  Psych: Good eye contact, normal affect. Memory intact not anxious or depressed appearing.  CNS: CN 2-12 intact, power,  normal throughout.no focal deficits  noted.   Assessment & Plan  Type 2 diabetes mellitus with vascular disease ([place]) [name] is reminded of the importance of commitment to daily physical activity for 30 minutes or more, as able and the need to limit carbohydrate intake to 30 to 60 grams per meal to help with blood sugar control.   The need to take medication as prescribed, test blood sugar as directed, and to call between visits if there is a concern that blood sugar is uncontrolled is also discussed.   [name] is reminded of the importance of daily foot exam, annual eye examination, and good blood sugar, blood pressure and cholesterol control.  Diabetic Labs Latest Ref Rng & Units 11/20/2020 07/17/2020 08/11/2019 02/22/2019 02/10/2019  HbA1c 4.8 - 5.6 % 6.7(H) 6.6(H) 5.9(H) - 6.0(H)  Chol 100 - 199 mg/dL 153 162 146 - 175  HDL >39 mg/dL 48 64 53 - 53  Calc LDL 0 - 99 mg/dL 81 81 74 - 97  Triglycerides 0 - 149 mg/dL 138 93 102 - 153(H)  Creatinine 0.57 - 1.00 mg/dL 0.80 0.84 0.71 0.72 0.73   BP/Weight 01/04/2021 11/20/2020 07/17/2020 03/20/2020 01/12/2020 08/24/2019 19/62/2297  Systolic BP 989 211 941 740 814 481 856  Diastolic BP 72 79 78 70 80 74 65  Wt. (Lbs) 165.12 173.12 173 170 168 172 -  BMI 31.2 32.71 32.69 32.12 31.74 32.5 -   No flowsheet data found.      HTN (hypertension) Controlled, no change in medication  DASH diet and commitment to daily physical activity for a minimum of 30 minutes discussed and encouraged, as a part of hypertension management. The importance of attaining a healthy weight is also discussed.  BP/Weight 01/04/2021 11/20/2020 07/17/2020 03/20/2020 01/12/2020 08/24/2019 42/55/2589  Systolic BP 483 475 830 746 002 984 730  Diastolic BP 72 79 78 70 80 74 65  Wt. (Lbs) 165.12 173.12 173 170 168 172 -  BMI 31.2 32.71 32.69 32.12 31.74 32.5 -       History of stroke Repeat/update brain scan  Depression, major, single episode, in partial remission (HCC) Controlled, no change in  medication   Anxiety Controlled, no change in medication   Obesity (BMI 30.0-34.9) Improved. Pt applauded on succesful weight loss through lifestyle change, and encouraged to continue same. Weight loss goal set for the next several months.   Hyperlipidemia LDL goal <70 Hyperlipidemia:Low fat diet discussed and encouraged.   Lipid Panel  Lab Results  Component Value Date   CHOL 153 11/20/2020   HDL 48 11/20/2020   LDLCALC 81 11/20/2020   TRIG 138 11/20/2020   CHOLHDL 3.2 11/20/2020  not at goal,  Updated lab needed at/ before next visit.      Chronic fatigue disorder Meds reviewed and will be updated

## 2021-01-07 NOTE — Progress Notes (Signed)
Subjective:  Patient ID: Carmen Mcintyre, female    DOB: [birth year],  MRN: [id number]  Chief Complaint  Patient presents with   Tendonitis    MRI results    56 y.o. female presents with the above complaint. History confirmed with patient.  She returns for follow-up she completed her MRI, the heel pain is still fairly persistent, boot has been helpful  Objective:  Physical Exam: warm, good capillary refill, no trophic changes or ulcerative lesions, normal DP and PT pulses and normal sensory exam. Left Foot: Dystrophic third toenail Right Foot: Sharp and palpation of plantar heel at insertion of plantar fascia, she has gastrocnemius equinus, less pain in the Achilles and base of fifth met today  No images are attached to the encounter.  Radiographs: X-ray of the right foot and ankle: no fracture, dislocation, swelling or degenerative changes noted and plantar calcaneal spur  Study Result  Narrative & Impression  CLINICAL DATA:  Right ankle pain   EXAM: MRI OF THE RIGHT ANKLE WITHOUT CONTRAST   TECHNIQUE: Multiplanar, multisequence MR imaging of the ankle was performed. No intravenous contrast was administered.   COMPARISON:  Foot radiographs 06/19/2020   FINDINGS: TENDONS   Peroneal: Mild tendinopathy in 2.5 cm segment of the peroneus longus tendon just distal to the peroneal tubercle. The peroneus brevis tendon appears intact.   Posteromedial: Mild distal tibialis posterior tendinopathy. Mild flexor hallucis longus tenosynovitis just proximal to the knot of Henry.   Anterior: Unremarkable   Achilles: Unremarkable   Plantar Fascia: Mildly thickened medial band of plantar fascia proximally, with trace endosteal edema along the plantar calcaneal spur as on image 9 series 8.   LIGAMENTS   Lateral: Unremarkable   Medial: Unremarkable   CARTILAGE   Ankle Joint: 0.3 by 0.3 cm small non-fragmented osteochondral lesion of the anterior tibial plafond on image 9  series 4.   Subtalar Joints/Sinus Tarsi: Unremarkable   Bones: No significant extra-articular osseous abnormalities identified.   Other: No supplemental non-categorized findings.   IMPRESSION: 1. Thickened medial band of the plantar fascia proximally compatible with plantar fasciitis. 2. Mild peroneus longus tendinopathy just distal to the peroneal tubercle. 3. Mild distal tibialis posterior tendinopathy. Correlate clinically in assessing for tibialis posterior dysfunction. 4. Mild flexor hallucis longus tenosynovitis just proximal to the knot of Henry. 5. 3 mm small non-fragmented osteochondral lesion of the anterior tibial plafond.     Electronically Signed   By: Van Clines M.D.   Assessment:   1. Plantar fasciitis of right foot   2. Peroneal tendinitis, right   3. Tendonitis, Achilles, right       Plan:  Patient was evaluated and treated and all questions answered.   Today we discussed further treatment including surgical and nonsurgical treatment.  She is been doing this for quite some time.  We discussed the option of plantar fasciotomy and the rationale behind this.  Also the same time we will plan for PRP injection, she still having pain in the Achilles and peroneal tendons we will inject along the tendon sheath as well.  We discussed the risk benefits and potential complications of surgery including but not limited to pain, swelling, infection, scar, numbness which may be temporary or permanent, chronic pain, stiffness, nerve pain or damage, wound healing problems.  She understands and wishes to proceed.  All questions were addressed.  Informed consent was signed and reviewed.   Surgical plan:  Procedure: -Right foot EPF and PRP injection  Location: -Major  Anesthesia plan: -IV sedation with local  Postoperative pain plan: - Tylenol 1000 mg every 6 hours, ibuprofen 600 mg every 6 hours, gabapentin 300 mg every 8 hours x5 days, oxycodone 5 mg 1-2  tabs every 6 hours only as needed  DVT prophylaxis: -None required  WB Restrictions / DME needs: -WBAT in CAM boot which she already has     No follow-ups on file.

## 2021-01-08 ENCOUNTER — Other Ambulatory Visit (HOSPITAL_COMMUNITY): Payer: Self-pay

## 2021-01-08 DIAGNOSIS — L814 Other melanin hyperpigmentation: Secondary | ICD-10-CM | POA: Diagnosis not present

## 2021-01-08 DIAGNOSIS — L700 Acne vulgaris: Secondary | ICD-10-CM | POA: Diagnosis not present

## 2021-01-08 DIAGNOSIS — L719 Rosacea, unspecified: Secondary | ICD-10-CM | POA: Diagnosis not present

## 2021-01-08 DIAGNOSIS — L821 Other seborrheic keratosis: Secondary | ICD-10-CM | POA: Diagnosis not present

## 2021-01-08 DIAGNOSIS — D225 Melanocytic nevi of trunk: Secondary | ICD-10-CM | POA: Diagnosis not present

## 2021-01-08 DIAGNOSIS — L570 Actinic keratosis: Secondary | ICD-10-CM | POA: Diagnosis not present

## 2021-01-08 DIAGNOSIS — Z23 Encounter for immunization: Secondary | ICD-10-CM | POA: Diagnosis not present

## 2021-01-08 HISTORY — PX: FOOT SURGERY: SHX648

## 2021-01-08 MED ORDER — TRETINOIN 0.05 % EX CREA
TOPICAL_CREAM | CUTANEOUS | 3 refills | Status: DC
  Filled 2021-01-08: qty 20, 20d supply, fill #0
  Filled 2021-01-21: qty 20, 30d supply, fill #0
  Filled 2021-09-10: qty 20, 30d supply, fill #1

## 2021-01-09 ENCOUNTER — Other Ambulatory Visit (HOSPITAL_COMMUNITY): Payer: Self-pay

## 2021-01-17 ENCOUNTER — Other Ambulatory Visit (HOSPITAL_COMMUNITY): Payer: Self-pay

## 2021-01-21 ENCOUNTER — Encounter: Payer: Self-pay | Admitting: Family Medicine

## 2021-01-21 ENCOUNTER — Other Ambulatory Visit (HOSPITAL_COMMUNITY): Payer: Self-pay

## 2021-01-22 ENCOUNTER — Encounter: Payer: Self-pay | Admitting: Family Medicine

## 2021-01-22 ENCOUNTER — Other Ambulatory Visit: Payer: Self-pay | Admitting: Family Medicine

## 2021-01-22 ENCOUNTER — Other Ambulatory Visit (HOSPITAL_COMMUNITY): Payer: Self-pay

## 2021-01-22 ENCOUNTER — Other Ambulatory Visit: Payer: Self-pay

## 2021-01-22 ENCOUNTER — Ambulatory Visit (HOSPITAL_COMMUNITY)
Admission: RE | Admit: 2021-01-22 | Discharge: 2021-01-22 | Disposition: A | Discharge status: Home / Self Care | Payer: 59 | Source: Ambulatory Visit | Attending: Family Medicine | Admitting: Family Medicine

## 2021-01-22 DIAGNOSIS — Z8673 Personal history of transient ischemic attack (TIA), and cerebral infarction without residual deficits: Secondary | ICD-10-CM | POA: Insufficient documentation

## 2021-01-22 DIAGNOSIS — R9402 Abnormal brain scan: Secondary | ICD-10-CM | POA: Diagnosis not present

## 2021-01-22 MED ORDER — TIRZEPATIDE 2.5 MG/0.5ML ~~LOC~~ SOAJ
2.5000 mg | SUBCUTANEOUS | 0 refills | Status: DC
  Filled 2021-01-22: qty 2, 28d supply, fill #0

## 2021-01-22 MED ORDER — GADOBUTROL 1 MMOL/ML IV SOLN
10.0000 mL | Freq: Once | INTRAVENOUS | Status: AC | PRN
  Administered 2021-01-22: 10 mL via INTRAVENOUS

## 2021-01-22 NOTE — Progress Notes (Signed)
Mounjaro refilled.  °

## 2021-01-22 NOTE — Telephone Encounter (Signed)
Per dr Moshe Cipro already refilled

## 2021-01-24 ENCOUNTER — Encounter: Payer: 59 | Admitting: Podiatry

## 2021-01-25 ENCOUNTER — Other Ambulatory Visit: Payer: Self-pay | Admitting: Family Medicine

## 2021-01-25 ENCOUNTER — Other Ambulatory Visit (HOSPITAL_COMMUNITY): Payer: Self-pay

## 2021-01-25 DIAGNOSIS — R7303 Prediabetes: Secondary | ICD-10-CM

## 2021-01-25 MED ORDER — LISINOPRIL 10 MG PO TABS
10.0000 mg | ORAL_TABLET | Freq: Every day | ORAL | 1 refills | Status: DC
  Filled 2021-01-25: qty 90, 90d supply, fill #0
  Filled 2021-04-29: qty 90, 90d supply, fill #1

## 2021-02-07 ENCOUNTER — Encounter: Payer: 59 | Admitting: Podiatry

## 2021-02-08 ENCOUNTER — Encounter: Payer: Self-pay | Admitting: Family Medicine

## 2021-02-08 DIAGNOSIS — R059 Cough, unspecified: Secondary | ICD-10-CM

## 2021-02-09 ENCOUNTER — Other Ambulatory Visit (HOSPITAL_COMMUNITY): Payer: Self-pay

## 2021-02-09 MED ORDER — BENZONATATE 100 MG PO CAPS
100.0000 mg | ORAL_CAPSULE | Freq: Two times a day (BID) | ORAL | 0 refills | Status: DC | PRN
Start: 2021-02-09 — End: 2021-02-22
  Filled 2021-02-09: qty 20, 10d supply, fill #0

## 2021-02-12 ENCOUNTER — Other Ambulatory Visit (HOSPITAL_COMMUNITY): Payer: Self-pay

## 2021-02-15 ENCOUNTER — Other Ambulatory Visit: Payer: Self-pay | Admitting: Podiatry

## 2021-02-15 ENCOUNTER — Other Ambulatory Visit (HOSPITAL_COMMUNITY): Payer: Self-pay

## 2021-02-15 DIAGNOSIS — M7671 Peroneal tendinitis, right leg: Secondary | ICD-10-CM | POA: Diagnosis not present

## 2021-02-15 DIAGNOSIS — M25571 Pain in right ankle and joints of right foot: Secondary | ICD-10-CM | POA: Diagnosis not present

## 2021-02-15 DIAGNOSIS — M722 Plantar fascial fibromatosis: Secondary | ICD-10-CM | POA: Diagnosis not present

## 2021-02-15 MED ORDER — OXYCODONE HCL 5 MG PO TABS
5.0000 mg | ORAL_TABLET | ORAL | 0 refills | Status: DC | PRN
  Filled 2021-02-15: qty 20, 4d supply, fill #0

## 2021-02-15 MED ORDER — ACETAMINOPHEN 500 MG PO TABS
1000.0000 mg | ORAL_TABLET | Freq: Four times a day (QID) | ORAL | 0 refills | Status: AC | PRN
  Filled 2021-02-15: qty 112, 14d supply, fill #0

## 2021-02-15 MED ORDER — GABAPENTIN 300 MG PO CAPS
300.0000 mg | ORAL_CAPSULE | Freq: Three times a day (TID) | ORAL | 0 refills | Status: DC
Start: 2021-02-15 — End: 2021-05-28
  Filled 2021-02-15: qty 21, 7d supply, fill #0

## 2021-02-15 MED ORDER — PROMETHAZINE HCL 25 MG PO TABS
25.0000 mg | ORAL_TABLET | Freq: Three times a day (TID) | ORAL | 0 refills | Status: DC | PRN
  Filled 2021-02-15: qty 20, 7d supply, fill #0

## 2021-02-15 MED ORDER — ONDANSETRON HCL 4 MG PO TABS
ORAL_TABLET | ORAL | 1 refills | Status: DC
  Filled 2021-02-15: qty 30, 30d supply, fill #0

## 2021-02-15 NOTE — Progress Notes (Signed)
12/9 EPF and PRP injections

## 2021-02-20 ENCOUNTER — Other Ambulatory Visit (HOSPITAL_COMMUNITY): Payer: Self-pay

## 2021-02-20 ENCOUNTER — Ambulatory Visit: Payer: 59 | Admitting: Family Medicine

## 2021-02-21 ENCOUNTER — Other Ambulatory Visit: Payer: Self-pay

## 2021-02-21 ENCOUNTER — Encounter: Payer: Self-pay | Admitting: Podiatry

## 2021-02-21 ENCOUNTER — Ambulatory Visit (INDEPENDENT_AMBULATORY_CARE_PROVIDER_SITE_OTHER): Payer: 59 | Admitting: Podiatry

## 2021-02-21 DIAGNOSIS — M722 Plantar fascial fibromatosis: Secondary | ICD-10-CM

## 2021-02-21 NOTE — Progress Notes (Signed)
°  Subjective:  Patient ID: Carmen Mcintyre, female    DOB: [birth year],  MRN: [id number]  Chief Complaint  Patient presents with   Routine Post Op    POV #1 DOS 02/15/2021 EPF RT    55 y.o. female returns for post-op check.  Doing well having minimal pain  Review of Systems: Negative except as noted in the HPI. Denies N/V/F/Ch.   Objective:  There were no vitals filed for this visit. There is no height or weight on file to calculate BMI. Constitutional Well developed. Well nourished.  Vascular Foot warm and well perfused. Capillary refill normal to all digits.  Calf is soft and supple, no posterior calf or knee pain, negative Homans' sign  Neurologic Normal speech. Oriented to person, place, and time. Epicritic sensation to light touch grossly present bilaterally.  Dermatologic Skin healing well without signs of infection. Skin edges well coapted without signs of infection.  Orthopedic: Tenderness to palpation noted about the surgical site.    Assessment:   1. Plantar fasciitis of right foot    Plan:  Patient was evaluated and treated and all questions answered.  S/p foot surgery right -Progressing as expected post-operatively. -WB Status: WBAT in CAM boot -Sutures: Removed next visit. -Medications: No refills required -Foot redressed.  She may begin bathing. -Begin range of motion and therapeutic exercise for the plantar fascia which we reviewed  Return in about 2 weeks (around 03/07/2021) for suture removal, post op (no x-rays).

## 2021-02-22 ENCOUNTER — Other Ambulatory Visit (HOSPITAL_COMMUNITY): Payer: Self-pay

## 2021-02-22 ENCOUNTER — Encounter: Payer: Self-pay | Admitting: Family Medicine

## 2021-02-22 ENCOUNTER — Ambulatory Visit: Payer: 59 | Admitting: Family Medicine

## 2021-02-22 VITALS — BP 122/73 | HR 96 | Resp 16 | Ht 61.0 in | Wt 163.0 lb

## 2021-02-22 DIAGNOSIS — G9332 Myalgic encephalomyelitis/chronic fatigue syndrome: Secondary | ICD-10-CM

## 2021-02-22 DIAGNOSIS — I10 Essential (primary) hypertension: Secondary | ICD-10-CM

## 2021-02-22 DIAGNOSIS — E559 Vitamin D deficiency, unspecified: Secondary | ICD-10-CM | POA: Diagnosis not present

## 2021-02-22 DIAGNOSIS — E1159 Type 2 diabetes mellitus with other circulatory complications: Secondary | ICD-10-CM | POA: Diagnosis not present

## 2021-02-22 DIAGNOSIS — E663 Overweight: Secondary | ICD-10-CM | POA: Diagnosis not present

## 2021-02-22 DIAGNOSIS — F324 Major depressive disorder, single episode, in partial remission: Secondary | ICD-10-CM

## 2021-02-22 DIAGNOSIS — E785 Hyperlipidemia, unspecified: Secondary | ICD-10-CM | POA: Diagnosis not present

## 2021-02-22 MED ORDER — AMPHETAMINE-DEXTROAMPHETAMINE 10 MG PO TABS
10.0000 mg | ORAL_TABLET | Freq: Every day | ORAL | 0 refills | Status: DC
  Filled 2021-02-22: qty 90, 90d supply, fill #0

## 2021-02-22 MED ORDER — AMPHETAMINE-DEXTROAMPHET ER 30 MG PO CP24
30.0000 mg | ORAL_CAPSULE | ORAL | 0 refills | Status: DC
  Filled 2021-02-22: qty 90, 90d supply, fill #0

## 2021-02-22 NOTE — Patient Instructions (Signed)
F/U in 13 weeks, call if you need me before  Congrats on weight loss and change in food choice   Fasting cBC, lipid, cmp and EGFr, hBA!C, TSH , vit D  5 days before next appt  Stop mounjaro  Pls get covid booster  It is important that you exercise regularly at least 30 minutes 5 times a week. If you develop chest pain, have severe difficulty breathing, or feel very tired, stop exercising immediately and seek medical attention   Think about what you will eat, plan ahead. Choose " clean, green, fresh or frozen" over canned, processed or packaged foods which are more sugary, salty and fatty. 70 to 75% of food eaten should be vegetables and fruit. Three meals at set times with snacks allowed between meals, but they must be fruit or vegetables. Aim to eat over a 12 hour period , example 7 am to 7 pm, and STOP after  your last meal of the day. Drink water,generally about 64 ounces per day, no other drink is as healthy. Fruit juice is best enjoyed in a healthy way, by EATING the fruit.  Thanks for choosing [facility], we consider it a privelige to serve you.

## 2021-02-23 LAB — HEMOGLOBIN A1C
Est. average glucose Bld gHb Est-mCnc: 114 mg/dL
Hgb A1c MFr Bld: 5.6 % (ref 4.8–5.6)

## 2021-02-23 LAB — BMP8+EGFR
BUN/Creatinine Ratio: 23 (ref 9–23)
BUN: 16 mg/dL (ref 6–24)
CO2: 21 mmol/L (ref 20–29)
Calcium: 9.3 mg/dL (ref 8.7–10.2)
Chloride: 102 mmol/L (ref 96–106)
Creatinine, Ser: 0.7 mg/dL (ref 0.57–1.00)
Glucose: 104 mg/dL — ABNORMAL HIGH (ref 70–99)
Potassium: 4.7 mmol/L (ref 3.5–5.2)
Sodium: 139 mmol/L (ref 134–144)
eGFR: 101 mL/min/{1.73_m2} (ref >59)

## 2021-02-25 ENCOUNTER — Other Ambulatory Visit (HOSPITAL_COMMUNITY): Payer: Self-pay

## 2021-02-26 ENCOUNTER — Ambulatory Visit: Payer: 59 | Admitting: Family Medicine

## 2021-02-26 ENCOUNTER — Encounter: Payer: Self-pay | Admitting: Family Medicine

## 2021-02-26 ENCOUNTER — Other Ambulatory Visit: Payer: Self-pay

## 2021-02-26 DIAGNOSIS — F411 Generalized anxiety disorder: Secondary | ICD-10-CM | POA: Diagnosis not present

## 2021-02-26 MED ORDER — BUSPIRONE HCL 5 MG PO TABS: 5.0000 mg | ORAL_TABLET | Freq: Two times a day (BID) | ORAL | 2 refills | Status: DC

## 2021-02-26 NOTE — Assessment & Plan Note (Signed)
Carmen Mcintyre is reminded of the importance of commitment to daily physical activity for 30 minutes or more, as able and the need to limit carbohydrate intake to 30 to 60 grams per meal to help with blood sugar control.   Carmen Mcintyre is reminded of the importance of daily foot exam, annual eye examination, and good blood sugar, blood pressure and cholesterol control.  Diabetic Labs Latest Ref Rng & Units 02/22/2021 11/20/2020 07/17/2020 08/11/2019 02/22/2019  HbA1c 4.8 - 5.6 % 5.6 6.7(H) 6.6(H) 5.9(H) -  Chol 100 - 199 mg/dL - 153 162 146 -  HDL >39 mg/dL - 48 64 53 -  Calc LDL 0 - 99 mg/dL - 81 81 74 -  Triglycerides 0 - 149 mg/dL - 138 93 102 -  Creatinine 0.57 - 1.00 mg/dL 0.70 0.80 0.84 0.71 0.72   BP/Weight 02/22/2021 01/04/2021 11/20/2020 07/17/2020 03/20/2020 01/12/2020 3/88/8280  Systolic BP 034 917 915 056 979 480 165  Diastolic BP 73 72 79 78 70 80 74  Wt. (Lbs) 163 165.12 173.12 173 170 168 172  BMI 30.8 31.2 32.71 32.69 32.12 31.74 32.5   No flowsheet data found.

## 2021-02-26 NOTE — Assessment & Plan Note (Signed)
aderall to be refilled today

## 2021-02-26 NOTE — Assessment & Plan Note (Signed)
Controlled, no change in medication DASH diet and commitment to daily physical activity for a minimum of 30 minutes discussed and encouraged, as a part of hypertension management. The importance of attaining a healthy weight is also discussed.  BP/Weight 02/22/2021 01/04/2021 11/20/2020 07/17/2020 03/20/2020 01/12/2020 0/35/0093  Systolic BP 818 299 371 696 789 381 017  Diastolic BP 73 72 79 78 70 80 74  Wt. (Lbs) 163 165.12 173.12 173 170 168 172  BMI 30.8 31.2 32.71 32.69 32.12 31.74 32.5

## 2021-02-26 NOTE — Patient Instructions (Signed)
F/u in 1 week, call if you need me sooner  Work excuse , from today to return in 1 week  New is buspar one twice daily for anxiety  Reconsider Employee Assistance   Thanks for choosing [facility], we consider it a privelige to serve you.

## 2021-02-26 NOTE — Assessment & Plan Note (Signed)
Controlled, no change in medication  

## 2021-02-26 NOTE — Assessment & Plan Note (Signed)
Hyperlipidemia:Low fat diet discussed and encouraged.   Lipid Panel  Lab Results  Component Value Date   CHOL 153 11/20/2020   HDL 48 11/20/2020   LDLCALC 81 11/20/2020   TRIG 138 11/20/2020   CHOLHDL 3.2 11/20/2020      Updated lab needed at/ before next visit. Goal for LDL under 70, not at goal when last checked

## 2021-02-26 NOTE — Progress Notes (Signed)
Carmen Mcintyre     MRN: [id number]      DOB: 11-04-1965   HPI Carmen Mcintyre is here for follow up and re-evaluation of chronic medical conditions, medication management and review of any available recent lab and radiology data.  Preventive health is updated, specifically  Cancer screening and Immunization.   Recent right foot surgery , improving , now partially weight bearing Excess n/v on mounjaro especially this past week, reports good weight loss and overall feeling better.  Needs med refilled for ADHD  ROS Denies recent fever or chills. Denies sinus pressure, nasal congestion, ear pain or sore throat. Denies chest congestion, productive cough or wheezing. Denies chest pains, palpitations and leg swelling Denies abdominal pain, ,diarrhea or constipation.   Denies dysuria, frequency, hesitancy or incontinence. Denies headaches, seizures, numbness, or tingling. Denies depression, anxiety or insomnia. Denies skin break down or rash.   PE  BP 122/73    Pulse 96    Resp 16    Ht 5\' 1"  (1.549 m)    Wt 163 lb (73.9 kg)    LMP 08/22/2016    SpO2 96%    BMI 30.80 kg/m   Patient alert and oriented and in no cardiopulmonary distress.  HEENT: No facial asymmetry, EOMI,     Neck supple .  Chest: Clear to auscultation bilaterally.  CVS: S1, S2 no murmurs, no S3.Regular rate.  ABD: Soft non tender.   Ext: No edema  MS: Adequate ROM spine, shoulders, hips and knees.  Skin: Intact, no ulcerations or rash noted.  Psych: Good eye contact, normal affect. Memory intact not anxious or depressed appearing.  CNS: CN 2-12 intact, power,  normal throughout.no focal deficits noted.   Assessment & Plan  HTN (hypertension) Controlled, no change in medication DASH diet and commitment to daily physical activity for a minimum of 30 minutes discussed and encouraged, as a part of hypertension management. The importance of attaining a healthy weight is also discussed.  BP/Weight 02/22/2021  01/04/2021 11/20/2020 07/17/2020 03/20/2020 01/12/2020 2/97/9892  Systolic BP 119 417 408 144 818 563 149  Diastolic BP 73 72 79 78 70 80 74  Wt. (Lbs) 163 165.12 173.12 173 170 168 172  BMI 30.8 31.2 32.71 32.69 32.12 31.74 32.5       Hyperlipidemia LDL goal <70 Hyperlipidemia:Low fat diet discussed and encouraged.   Lipid Panel  Lab Results  Component Value Date   CHOL 153 11/20/2020   HDL 48 11/20/2020   LDLCALC 81 11/20/2020   TRIG 138 11/20/2020   CHOLHDL 3.2 11/20/2020      Updated lab needed at/ before next visit. Goal for LDL under 70, not at goal when last checked  Overweight (BMI 25.0-29.9) Improved. Pt applauded on succesful weight loss through lifestyle change, and encouraged to continue same. Weight loss goal set for the next several months.   Type 2 diabetes mellitus with vascular disease ([place]) Carmen Mcintyre is reminded of the importance of commitment to daily physical activity for 30 minutes or more, as able and the need to limit carbohydrate intake to 30 to 60 grams per meal to help with blood sugar control.   Carmen Mcintyre is reminded of the importance of daily foot exam, annual eye examination, and good blood sugar, blood pressure and cholesterol control.  Diabetic Labs Latest Ref Rng & Units 02/22/2021 11/20/2020 07/17/2020 08/11/2019 02/22/2019  HbA1c 4.8 - 5.6 % 5.6 6.7(H) 6.6(H) 5.9(H) -  Chol 100 - 199 mg/dL - 153 162 146 -  HDL >39 mg/dL - 48 64 53 -  Calc LDL 0 - 99 mg/dL - 81 81 74 -  Triglycerides 0 - 149 mg/dL - 138 93 102 -  Creatinine 0.57 - 1.00 mg/dL 0.70 0.80 0.84 0.71 0.72   BP/Weight 02/22/2021 01/04/2021 11/20/2020 07/17/2020 03/20/2020 01/12/2020 9/47/1252  Systolic BP 712 929 090 301 499 692 493  Diastolic BP 73 72 79 78 70 80 74  Wt. (Lbs) 163 165.12 173.12 173 170 168 172  BMI 30.8 31.2 32.71 32.69 32.12 31.74 32.5   No flowsheet data found.      Depression, major, single episode, in partial remission (HCC) Controlled, no change in  medication   Chronic fatigue disorder aderall to be refilled today

## 2021-02-26 NOTE — Assessment & Plan Note (Signed)
Improved. Pt applauded on succesful weight loss through lifestyle change, and encouraged to continue same. Weight loss goal set for the next several months.  

## 2021-02-28 ENCOUNTER — Encounter: Payer: 59 | Admitting: Podiatry

## 2021-03-04 ENCOUNTER — Encounter: Payer: Self-pay | Admitting: Family Medicine

## 2021-03-04 DIAGNOSIS — F411 Generalized anxiety disorder: Secondary | ICD-10-CM | POA: Insufficient documentation

## 2021-03-04 NOTE — Progress Notes (Signed)
Virtual Visit via Telephone Note  I connected with Carmen Mcintyre on 03/04/21 at  4:40 PM EST by telephone and verified that I am speaking with the correct person using two identifiers.  Location: Patient: home Provider: office   I discussed the limitations, risks, security and privacy concerns of performing an evaluation and management service by telephone and the availability of in person appointments. I also discussed with the patient that there may be a patient responsible charge related to this service. The patient expressed understanding and agreed to proceed.   History of Present Illness:   c/o uncontrolled anxiety on her job, incapable of working when she returned today due to excess anxiety with nausea  Feels overwhelmed, needs time to determine if she is capable of continuing her job Recently out for 1 week and did not anticipate this reaction although she has been aware of increased job stress in recent times Observations/Objective: BP 122/76    Pulse 88    LMP 08/22/2016    SpO2 97%  Good communication with no confusion and intact memory. Alert and oriented x 3 No signs of respiratory distress during speech Sounds anxious and distressed   Assessment and Plan:  GAD (generalized anxiety disorder) Uncontrolled and incapable of working currently.  Resume buspar, work excuse for 1 week, offered therapy, no interest currently  Follow Up Instructions:    I discussed the assessment and treatment plan with the patient. The patient was provided an opportunity to ask questions and all were answered. The patient agreed with the plan and demonstrated an understanding of the instructions.   The patient was advised to call back or seek an in-person evaluation if the symptoms worsen or if the condition fails to improve as anticipated.  I provided 12 minutes of non-face-to-face time during this encounter.   Tula Nakayama, MD

## 2021-03-04 NOTE — Assessment & Plan Note (Signed)
Uncontrolled and incapable of working currently.  Resume buspar, work excuse for 1 week, offered therapy, no interest currently

## 2021-03-07 ENCOUNTER — Encounter: Payer: 59 | Admitting: Podiatry

## 2021-03-12 ENCOUNTER — Other Ambulatory Visit: Payer: Self-pay

## 2021-03-12 ENCOUNTER — Ambulatory Visit: Payer: 59 | Admitting: Family Medicine

## 2021-03-12 ENCOUNTER — Ambulatory Visit (INDEPENDENT_AMBULATORY_CARE_PROVIDER_SITE_OTHER): Payer: 59 | Admitting: Podiatry

## 2021-03-12 DIAGNOSIS — M722 Plantar fascial fibromatosis: Secondary | ICD-10-CM

## 2021-03-12 NOTE — Progress Notes (Signed)
°  Subjective:  Patient ID: Carmen Mcintyre, female    DOB: November 04, 1965,  MRN: [id number]  Chief Complaint  Patient presents with   Routine Post Op     suture removal,  POV #2 DOS 02/15/2021 EPF RT    56 y.o. female returns for post-op check.  Doing well having almost no pain and the sutures came out on their own  Review of Systems: Negative except as noted in the HPI. Denies N/V/F/Ch.   Objective:  There were no vitals filed for this visit. There is no height or weight on file to calculate BMI. Constitutional Well developed. Well nourished.  Vascular Foot warm and well perfused. Capillary refill normal to all digits.  Calf is soft and supple, no posterior calf or knee pain, negative Homans' sign  Neurologic Normal speech. Oriented to person, place, and time. Epicritic sensation to light touch grossly present bilaterally.  Dermatologic Well-healed surgical incision  Orthopedic: Tenderness to palpation noted about the surgical site.    Assessment:   1. Plantar fasciitis of right foot    Plan:  Patient was evaluated and treated and all questions answered.  S/p foot surgery right -She is doing well.  Her sutures are already out.  Incision is well-healed.  I have no further restrictions for her.  She is having some left knee pain which may be compensatory from the postop progress, it sounds like a meniscal issue to me advised her to seek care at the sports medicine practice and she will do this if it continues to worsen.  Return to see me as needed  Return if symptoms worsen or fail to improve.

## 2021-03-15 ENCOUNTER — Ambulatory Visit: Payer: 59 | Admitting: Family Medicine

## 2021-03-19 ENCOUNTER — Telehealth: Payer: Self-pay | Admitting: Family Medicine

## 2021-03-19 DIAGNOSIS — H524 Presbyopia: Secondary | ICD-10-CM | POA: Diagnosis not present

## 2021-03-19 LAB — HM DIABETES EYE EXAM

## 2021-03-19 NOTE — Telephone Encounter (Signed)
Pls call and advise diabetic eye exam for screening for retinal sdisease is recommended annually  in diabetics, eiher refer her to ophthalmology of her choice or arrange screening exam in the office, she cannothave both in one calendar year so make best choice ?? Plss ask, thanks

## 2021-03-20 NOTE — Telephone Encounter (Signed)
Can you please schedule patient to come in for the next diabetic retinal screening (or either she can go to the eye dr for a regular diabetic eye screening, but she can't do both) I tried to find the message when the next screening date was but I couldn't find it

## 2021-03-20 NOTE — Telephone Encounter (Signed)
Left voicemail for patient to call office.

## 2021-03-22 NOTE — Telephone Encounter (Signed)
Patient had her eye exam on Tuesday, 03/19/2021 and check for everything even A1c, everything check out good.

## 2021-03-25 NOTE — Telephone Encounter (Signed)
Please find out which eye dr pt went to

## 2021-03-26 ENCOUNTER — Other Ambulatory Visit (HOSPITAL_BASED_OUTPATIENT_CLINIC_OR_DEPARTMENT_OTHER): Payer: Self-pay

## 2021-03-26 MED ORDER — COVID-19 AT HOME ANTIGEN TEST VI KIT
PACK | 0 refills | Status: DC
  Filled 2021-03-26: qty 2, 4d supply, fill #0

## 2021-03-26 NOTE — Telephone Encounter (Signed)
noted 

## 2021-03-26 NOTE — Telephone Encounter (Signed)
Called patient and left voicemail to contact our office which eye dr she saw and also sent mychart message.

## 2021-03-28 ENCOUNTER — Encounter: Payer: 59 | Admitting: Podiatry

## 2021-04-09 ENCOUNTER — Ambulatory Visit: Payer: 59 | Admitting: Family Medicine

## 2021-04-22 ENCOUNTER — Other Ambulatory Visit (HOSPITAL_COMMUNITY): Payer: Self-pay

## 2021-04-22 ENCOUNTER — Other Ambulatory Visit: Payer: Self-pay | Admitting: Family Medicine

## 2021-04-22 ENCOUNTER — Encounter: Payer: Self-pay | Admitting: Family Medicine

## 2021-04-29 ENCOUNTER — Other Ambulatory Visit: Payer: Self-pay | Admitting: Pharmacist

## 2021-04-29 ENCOUNTER — Other Ambulatory Visit (HOSPITAL_COMMUNITY): Payer: Self-pay

## 2021-04-29 MED ORDER — CARESTART COVID-19 HOME TEST VI KIT
PACK | 1 refills | Status: DC
  Filled 2021-04-29: qty 2, 1d supply, fill #0

## 2021-05-01 ENCOUNTER — Ambulatory Visit: Payer: 59 | Admitting: Family Medicine

## 2021-05-01 ENCOUNTER — Other Ambulatory Visit (HOSPITAL_COMMUNITY): Payer: Self-pay

## 2021-05-01 ENCOUNTER — Encounter: Payer: Self-pay | Admitting: Internal Medicine

## 2021-05-01 ENCOUNTER — Other Ambulatory Visit: Payer: Self-pay

## 2021-05-01 ENCOUNTER — Ambulatory Visit: Payer: 59 | Admitting: Internal Medicine

## 2021-05-01 VITALS — BP 138/82 | HR 99 | Resp 18 | Ht 61.0 in | Wt 161.4 lb

## 2021-05-01 DIAGNOSIS — K219 Gastro-esophageal reflux disease without esophagitis: Secondary | ICD-10-CM | POA: Diagnosis not present

## 2021-05-01 DIAGNOSIS — L738 Other specified follicular disorders: Secondary | ICD-10-CM | POA: Insufficient documentation

## 2021-05-01 DIAGNOSIS — L570 Actinic keratosis: Secondary | ICD-10-CM | POA: Insufficient documentation

## 2021-05-01 MED ORDER — OMEPRAZOLE 20 MG PO CPDR
20.0000 mg | DELAYED_RELEASE_CAPSULE | Freq: Two times a day (BID) | ORAL | 2 refills | Status: DC | PRN
  Filled 2021-05-01: qty 60, 30d supply, fill #0

## 2021-05-01 NOTE — Progress Notes (Addendum)
Acute Office Visit  Subjective:    Patient ID: Carmen Mcintyre, female    DOB: [birth year], 56 y.o.   MRN: [id number]  Chief Complaint  Patient presents with   Acute Visit    Burning in center stomach started 04-29-21 this is constant did buy omeprazole and gavastine over the counter this helps but doesn't completely go away     HPI Patient is in today for complaint of epigastric pain and nausea for the last 3 days.  She denies any dysphagia or odynophagia currently.  Denies any change in bowel habits recently.  Denies any melena or hematochezia currently.  She has tried using omeprazole and Gaviscon with some relief.  Past Medical History:  Diagnosis Date   Allergy    Anxiety    Depression    Dyslipidemia    Hyperlipidemia    Hypertension    Insomnia    Stroke [facility])    Urinary incontinence     Past Surgical History:  Procedure Laterality Date   BREAST BIOPSY Left    benign   BUBBLE STUDY  02/25/2019   Procedure: BUBBLE STUDY;  Surgeon: Skeet Latch, MD;  Location: [place];  Service: Cardiovascular;;   CAESAREAN  1997/2003   CESAREAN SECTION N/A    [employer] 03/17/2020   CHOLECYSTECTOMY  2005   CHOLECYSTECTOMY     LEFT BREAST MASS REMOVED  1998   RT LOWER LOBECTOMY SECONDARY IN INFECTION  2006   TEE WITHOUT CARDIOVERSION N/A 02/25/2019   Procedure: TRANSESOPHAGEAL ECHOCARDIOGRAM (TEE);  Surgeon: Skeet Latch, MD;  Location: [facility] ENDOSCOPY;  Service: Cardiovascular;  Laterality: N/A;    Family History  Problem Relation Age of Onset   Diabetes Mother    Arthritis Other    Breast cancer Maternal Aunt    Cancer Paternal Grandmother        uterine   Colon cancer Neg Hx    Esophageal cancer Neg Hx    Liver cancer Neg Hx    Pancreatic cancer Neg Hx    Rectal cancer Neg Hx    Stomach cancer Neg Hx     Social History   Socioeconomic History   Marital status: Married    Spouse name: Not on file   Number of children: 2   Years of education: Not on  file   Highest education level: Not on file  Occupational History   Occupation: Employed   Tobacco Use   Smoking status: Never   Smokeless tobacco: Never  Vaping Use   Vaping Use: Never used  Substance and Sexual Activity   Alcohol use: Yes    Comment: occ. wine   Drug use: No   Sexual activity: Yes    Birth control/protection: Post-menopausal  Other Topics Concern   Not on file  Social History Narrative   Not on file   Social Determinants of Health   Financial Resource Strain: Not on file  Food Insecurity: Not on file  Transportation Needs: Not on file  Physical Activity: Not on file  Stress: Not on file  Social Connections: Not on file  Intimate Partner Violence: Not on file    Outpatient Medications Prior to Visit  Medication Sig Dispense Refill   amphetamine-dextroamphetamine (ADDERALL XR) 30 MG 24 hr capsule Take 1 capsule (30 mg total) by mouth every morning. 90 capsule 0   amphetamine-dextroamphetamine (ADDERALL) 10 MG tablet Take 1 tablet (10 mg total) by mouth daily with breakfast. 90 tablet 0   aspirin 81 MG tablet Take 1  tablet (81 mg total) by mouth daily. (Patient taking differently: Take 162 mg by mouth daily.) 30 tablet 1   atorvastatin (LIPITOR) 20 MG tablet TAKE 1 TABLET BY MOUTH ONCE DAILY 90 tablet 3   gabapentin (NEURONTIN) 300 MG capsule Take 1 capsule (300 mg total) by mouth 3 (three) times daily for 7 days. 21 capsule 0   lisinopril (ZESTRIL) 10 MG tablet Take 1 tablet (10 mg total) by mouth daily. 90 tablet 1   tretinoin (RETIN-A) 0.05 % cream Apply 1 application in the evening to face as directed 20 g 3   venlafaxine XR (EFFEXOR-XR) 150 MG 24 hr capsule TAKE 1 CAPSULE BY MOUTH DAILY WITH BREAKFAST. 90 capsule 3   amphetamine-dextroamphetamine (ADDERALL) 10 MG tablet Take 10 mg by mouth daily with breakfast. (Patient not taking: Reported on 05/01/2021)     busPIRone (BUSPAR) 5 MG tablet Take 1 tablet (5 mg total) by mouth 2 (two) times daily. (Patient  not taking: Reported on 05/01/2021) 60 tablet 2   COVID-19 At Home Antigen Test (CARESTART COVID-19 HOME TEST) KIT Use as directed. (Patient not taking: Reported on 05/01/2021) 2 kit 1   No facility-administered medications prior to visit.    Allergies  Allergen Reactions   Hydrocodone Nausea Only   Mounjaro [Tirzepatide] Nausea And Vomiting    Review of Systems  Constitutional:  Negative for chills and fatigue.  Respiratory:  Negative for cough and shortness of breath.   Cardiovascular:  Negative for chest pain and palpitations.  Gastrointestinal:  Positive for abdominal pain and nausea. Negative for diarrhea and vomiting.  Genitourinary:  Negative for dysuria and hematuria.  Skin:  Negative for rash.      Objective:    Physical Exam Constitutional:      General: She is not in acute distress.    Appearance: She is not diaphoretic.  Cardiovascular:     Rate and Rhythm: Normal rate and regular rhythm.     Heart sounds: Normal heart sounds. No murmur heard. Pulmonary:     Breath sounds: Normal breath sounds. No wheezing or rales.  Abdominal:     Palpations: Abdomen is soft.     Tenderness: There is abdominal tenderness (Mild, epigastric).  Neurological:     General: No focal deficit present.     Mental Status: She is alert and oriented to person, place, and time.    BP 138/82 (BP Location: Right Arm, Patient Position: Sitting, Cuff Size: Normal)    Pulse 99    Resp 18    Ht '5\' 1"'  (1.549 m)    Wt 161 lb 6.4 oz (73.2 kg)    LMP 08/22/2016    SpO2 97%    BMI 30.50 kg/m  Wt Readings from Last 3 Encounters:  05/01/21 161 lb 6.4 oz (73.2 kg)  02/22/21 163 lb (73.9 kg)  01/04/21 165 lb 1.9 oz (74.9 kg)        Assessment & Plan:   Problem List Items Addressed This Visit       Digestive   Gastroesophageal reflux disease without esophagitis - Primary    Her symptoms are likely from GERD/gastritis Trial of Omeprazole 20 mg QAM and PRN QPM Can continue Gaviscon PRN Advised  to avoid hot and spicy food If persistent, will refer to GI.      Relevant Medications   omeprazole (PRILOSEC) 20 MG capsule     Meds ordered this encounter  Medications   omeprazole (PRILOSEC) 20 MG capsule    Sig: Take  1 capsule (20 mg total) by mouth 2 (two) times daily as needed (Acid reflux).    Dispense:  60 capsule    Refill:  2     Amelia Burgard Keith Rake, MD

## 2021-05-01 NOTE — Assessment & Plan Note (Addendum)
Her symptoms are likely from GERD/gastritis Trial of Omeprazole 20 mg QAM and PRN QPM Can continue Gaviscon PRN Advised to avoid hot and spicy food If persistent, will refer to GI.

## 2021-05-01 NOTE — Patient Instructions (Signed)
Please start taking Omeprazole once in the morning and as needed in the evening.  Please avoid hot and spicy food.

## 2021-05-03 ENCOUNTER — Encounter: Payer: Self-pay | Admitting: Family Medicine

## 2021-05-28 ENCOUNTER — Other Ambulatory Visit (HOSPITAL_COMMUNITY): Payer: Self-pay

## 2021-05-28 ENCOUNTER — Ambulatory Visit: Payer: 59 | Admitting: Family Medicine

## 2021-05-28 ENCOUNTER — Encounter: Payer: Self-pay | Admitting: Family Medicine

## 2021-05-28 ENCOUNTER — Other Ambulatory Visit: Payer: Self-pay

## 2021-05-28 VITALS — BP 147/85 | HR 99 | Ht 61.0 in | Wt 165.0 lb

## 2021-05-28 DIAGNOSIS — I10 Essential (primary) hypertension: Secondary | ICD-10-CM

## 2021-05-28 DIAGNOSIS — F324 Major depressive disorder, single episode, in partial remission: Secondary | ICD-10-CM

## 2021-05-28 DIAGNOSIS — G9332 Myalgic encephalomyelitis/chronic fatigue syndrome: Secondary | ICD-10-CM

## 2021-05-28 DIAGNOSIS — E1159 Type 2 diabetes mellitus with other circulatory complications: Secondary | ICD-10-CM

## 2021-05-28 DIAGNOSIS — E785 Hyperlipidemia, unspecified: Secondary | ICD-10-CM | POA: Diagnosis not present

## 2021-05-28 MED ORDER — LISINOPRIL 20 MG PO TABS
20.0000 mg | ORAL_TABLET | Freq: Every day | ORAL | 0 refills | Status: DC
  Filled 2021-05-28: qty 90, 90d supply, fill #0

## 2021-05-28 MED ORDER — VENLAFAXINE HCL ER 75 MG PO CP24
75.0000 mg | ORAL_CAPSULE | Freq: Every day | ORAL | 0 refills | Status: DC
  Filled 2021-05-28: qty 90, 90d supply, fill #0

## 2021-05-28 MED ORDER — AMPHETAMINE-DEXTROAMPHETAMINE 10 MG PO TABS
10.0000 mg | ORAL_TABLET | Freq: Every day | ORAL | 0 refills | Status: DC
  Filled 2021-05-28: qty 90, 90d supply, fill #0

## 2021-05-28 MED ORDER — AMPHETAMINE-DEXTROAMPHET ER 30 MG PO CP24
30.0000 mg | ORAL_CAPSULE | Freq: Every day | ORAL | 0 refills | Status: DC
  Filled 2021-05-28: qty 90, 90d supply, fill #0

## 2021-05-28 NOTE — Assessment & Plan Note (Signed)
Carmen Mcintyre is reminded of the importance of commitment to daily physical activity for 30 minutes or more, as able and the need to limit carbohydrate intake to 30 to 60 grams per meal to help with blood sugar control.  ? ?The need to take medication as prescribed, test blood sugar as directed, and to call between visits if there is a concern that blood sugar is uncontrolled is also discussed.  ? ?Carmen Mcintyre is reminded of the importance of daily foot exam, annual eye examination, and good blood sugar, blood pressure and cholesterol control. ?Updated lab needed at/ before next visit. ? ? ?Diabetic Labs Latest Ref Rng & Units 02/22/2021 11/20/2020 07/17/2020 08/11/2019 02/22/2019  ?HbA1c 4.8 - 5.6 % 5.6 6.7(H) 6.6(H) 5.9(H) -  ?Chol 100 - 199 mg/dL - 153 162 146 -  ?HDL >39 mg/dL - 48 64 53 -  ?Calc LDL 0 - 99 mg/dL - 81 81 74 -  ?Triglycerides 0 - 149 mg/dL - 138 93 102 -  ?Creatinine 0.57 - 1.00 mg/dL 0.70 0.80 0.84 0.71 0.72  ? ?BP/Weight 05/28/2021 05/01/2021 02/26/2021 02/22/2021 01/04/2021 11/20/2020 07/17/2020  ?Systolic BP 951 884 166 063 117 131 120  ?Diastolic BP 85 82 76 73 72 79 78  ?Wt. (Lbs) 165.04 161.4 - 163 165.12 173.12 173  ?BMI 31.18 30.5 - 30.8 31.2 32.71 32.69  ? ?Foot/eye exam completion dates Latest Ref Rng & Units 03/19/2021  ?Eye Exam No Retinopathy No Retinopathy  ?Foot Form Completion - -  ? ? ? ? ? ?

## 2021-05-28 NOTE — Assessment & Plan Note (Signed)
Continue daily adderrall as before, scripts sent ?

## 2021-05-28 NOTE — Assessment & Plan Note (Signed)
Improved, taper off effexor over next 6 months is the goal ?

## 2021-05-28 NOTE — Assessment & Plan Note (Signed)
Hyperlipidemia:Low fat diet discussed and encouraged. ? ? ?Lipid Panel  ?Lab Results  ?Component Value Date  ? CHOL 153 11/20/2020  ? HDL 48 11/20/2020  ? [place] 81 11/20/2020  ? TRIG 138 11/20/2020  ? CHOLHDL 3.2 11/20/2020  ? ?Updated lab needed at/ before next visit. ? ? ? ?

## 2021-05-28 NOTE — Patient Instructions (Addendum)
F/U in early May, call if you need me sooner, bP re eval ? ?Increase dose of lisinopril to 20 mg daily , blood pressure is elevated ? ?Reduce dose of effexor, take one daily for 6 weeks, then one every other day as tolerated, we are trying to wean off this ? ? ?Fasting labs as ordered in December, `approx 5 days before visit ? ?It is important that you exercise regularly at least 30 minutes 5 times a week. If you develop chest pain, have severe difficulty breathing, or feel very tired, stop exercising immediately and seek medical attention  ? ?Think about what you will eat, plan ahead. ?Choose " clean, green, fresh or frozen" over canned, processed or packaged foods which are more sugary, salty and fatty. ?70 to 75% of food eaten should be vegetables and fruit. ?Three meals at set times with snacks allowed between meals, but they must be fruit or vegetables. ?Aim to eat over a 12 hour period , example 7 am to 7 pm, and STOP after  your last meal of the day. ?Drink water,generally about 64 ounces per day, no other drink is as healthy. Fruit juice is best enjoyed in a healthy way, by EATING the fruit. ? ? ?Thanks for choosing [facility], we consider it a privelige to serve you. ? ?

## 2021-05-28 NOTE — Progress Notes (Signed)
? ?CLOTEAL ISAACSON     MRN: [id number]      DOB: 1965/04/12 ? ? ?HPI ?Ms. Abadi is here for follow up and re-evaluation of chronic medical conditions, medication management and review of any available recent lab and radiology data.  ?Preventive health is updated, specifically  Cancer screening and Immunization.   ?Questions or concerns regarding consultations or procedures which the PT has had in the interim are  addressed. ?The PT denies any adverse reactions to current medications since the last visit. Has questions about statin and aspirin , will stay on both due to cerebrovascular disease ?Trying to wean of effexor, no longer working and has several fun trips planned ?ROS ?Denies recent fever or chills. ?Denies sinus pressure, nasal congestion, ear pain or sore throat. ?Denies chest congestion, productive cough or wheezing. ?Denies chest pains, palpitations and leg swelling ?Denies abdominal pain, nausea, vomiting,diarrhea or constipation.   ?Denies dysuria, frequency, hesitancy or incontinence. ?Denies joint pain, swelling and limitation in mobility. ?Denies headaches, seizures, numbness, or tingling. ?Denies depression, anxiety or insomnia. ?Denies skin break down or rash. ? ? ?PE ? ?BP (!) 147/85 (BP Location: Left Arm)   Pulse 99   Ht '5\' 1"'$  (1.549 m)   Wt 165 lb 0.6 oz (74.9 kg)   LMP 08/22/2016   SpO2 98%   BMI 31.18 kg/m?  ? ?Patient alert and oriented and in no cardiopulmonary distress. ? ?HEENT: No facial asymmetry, EOMI,     Neck supple . ? ?Chest: Clear to auscultation bilaterally. ? ?CVS: S1, S2 no murmurs, no S3.Regular rate. ? ?ABD: Soft non tender.  ? ?Ext: No edema ? ?MS: Adequate ROM spine, shoulders, hips and knees. ? ?Skin: Intact, no ulcerations or rash noted. ? ?Psych: Good eye contact, normal affect. Memory intact not anxious or depressed appearing. ? ?CNS: CN 2-12 intact, power,  normal throughout.no focal deficits noted. ? ? ?Assessment & Plan ? ?HTN (hypertension) ?Uncontrolled,  increase med dose ?DASH diet and commitment to daily physical activity for a minimum of 30 minutes discussed and encouraged, as a part of hypertension management. ?The importance of attaining a healthy weight is also discussed. ? ?BP/Weight 05/28/2021 05/01/2021 02/26/2021 02/22/2021 01/04/2021 11/20/2020 07/17/2020  ?Systolic BP 759 163 846 659 117 131 120  ?Diastolic BP 85 82 76 73 72 79 78  ?Wt. (Lbs) 165.04 161.4 - 163 165.12 173.12 173  ?BMI 31.18 30.5 - 30.8 31.2 32.71 32.69  ? ? ? ? ? ?Hyperlipidemia LDL goal <70 ?Hyperlipidemia:Low fat diet discussed and encouraged. ? ? ?Lipid Panel  ?Lab Results  ?Component Value Date  ? CHOL 153 11/20/2020  ? HDL 48 11/20/2020  ? [place] 81 11/20/2020  ? TRIG 138 11/20/2020  ? CHOLHDL 3.2 11/20/2020  ? ?Updated lab needed at/ before next visit. ? ? ? ? ?Type 2 diabetes mellitus with vascular disease ([place]) ?Ms. Woehl is reminded of the importance of commitment to daily physical activity for 30 minutes or more, as able and the need to limit carbohydrate intake to 30 to 60 grams per meal to help with blood sugar control.  ? ?The need to take medication as prescribed, test blood sugar as directed, and to call between visits if there is a concern that blood sugar is uncontrolled is also discussed.  ? ?Ms. Fickle is reminded of the importance of daily foot exam, annual eye examination, and good blood sugar, blood pressure and cholesterol control. ?Updated lab needed at/ before next visit. ? ? ?Diabetic Labs Latest  Ref Rng & Units 02/22/2021 11/20/2020 07/17/2020 08/11/2019 02/22/2019  ?HbA1c 4.8 - 5.6 % 5.6 6.7(H) 6.6(H) 5.9(H) -  ?Chol 100 - 199 mg/dL - 153 162 146 -  ?HDL >39 mg/dL - 48 64 53 -  ?Calc LDL 0 - 99 mg/dL - 81 81 74 -  ?Triglycerides 0 - 149 mg/dL - 138 93 102 -  ?Creatinine 0.57 - 1.00 mg/dL 0.70 0.80 0.84 0.71 0.72  ? ?BP/Weight 05/28/2021 05/01/2021 02/26/2021 02/22/2021 01/04/2021 11/20/2020 07/17/2020  ?Systolic BP 299 371 696 789 117 131 120  ?Diastolic BP 85 82 76 73 72  79 78  ?Wt. (Lbs) 165.04 161.4 - 163 165.12 173.12 173  ?BMI 31.18 30.5 - 30.8 31.2 32.71 32.69  ? ?Foot/eye exam completion dates Latest Ref Rng & Units 03/19/2021  ?Eye Exam No Retinopathy No Retinopathy  ?Foot Form Completion - -  ? ? ? ? ? ? ?Depression, major, single episode, in partial remission ([place]) ?Improved, taper off effexor over next 6 months is the goal ? ?Chronic fatigue disorder ?Continue daily adderrall as before, scripts sent ? ?

## 2021-05-28 NOTE — Assessment & Plan Note (Signed)
Uncontrolled, increase med dose ?DASH diet and commitment to daily physical activity for a minimum of 30 minutes discussed and encouraged, as a part of hypertension management. ?The importance of attaining a healthy weight is also discussed. ? ?BP/Weight 05/28/2021 05/01/2021 02/26/2021 02/22/2021 01/04/2021 11/20/2020 07/17/2020  ?Systolic BP 718 367 255 001 117 131 120  ?Diastolic BP 85 82 76 73 72 79 78  ?Wt. (Lbs) 165.04 161.4 - 163 165.12 173.12 173  ?BMI 31.18 30.5 - 30.8 31.2 32.71 32.69  ? ? ? ? ?

## 2021-05-29 ENCOUNTER — Other Ambulatory Visit (HOSPITAL_COMMUNITY): Payer: Self-pay

## 2021-06-04 ENCOUNTER — Other Ambulatory Visit (HOSPITAL_COMMUNITY): Payer: Self-pay

## 2021-07-08 DIAGNOSIS — E559 Vitamin D deficiency, unspecified: Secondary | ICD-10-CM | POA: Diagnosis not present

## 2021-07-08 DIAGNOSIS — R748 Abnormal levels of other serum enzymes: Secondary | ICD-10-CM | POA: Diagnosis not present

## 2021-07-08 DIAGNOSIS — E1159 Type 2 diabetes mellitus with other circulatory complications: Secondary | ICD-10-CM | POA: Diagnosis not present

## 2021-07-09 ENCOUNTER — Ambulatory Visit: Payer: 59 | Admitting: Family Medicine

## 2021-07-09 ENCOUNTER — Encounter: Payer: Self-pay | Admitting: Family Medicine

## 2021-07-09 VITALS — BP 135/82 | HR 82 | Resp 16 | Ht 61.0 in | Wt 164.0 lb

## 2021-07-09 DIAGNOSIS — F324 Major depressive disorder, single episode, in partial remission: Secondary | ICD-10-CM | POA: Diagnosis not present

## 2021-07-09 DIAGNOSIS — E663 Overweight: Secondary | ICD-10-CM

## 2021-07-09 DIAGNOSIS — E1159 Type 2 diabetes mellitus with other circulatory complications: Secondary | ICD-10-CM

## 2021-07-09 DIAGNOSIS — E785 Hyperlipidemia, unspecified: Secondary | ICD-10-CM

## 2021-07-09 DIAGNOSIS — G9332 Myalgic encephalomyelitis/chronic fatigue syndrome: Secondary | ICD-10-CM

## 2021-07-09 DIAGNOSIS — R748 Abnormal levels of other serum enzymes: Secondary | ICD-10-CM | POA: Diagnosis not present

## 2021-07-09 DIAGNOSIS — I639 Cerebral infarction, unspecified: Secondary | ICD-10-CM

## 2021-07-09 LAB — CBC
Hematocrit: 42.4 % (ref 34.0–46.6)
Hemoglobin: 14.6 g/dL (ref 11.1–15.9)
MCH: 31.4 pg (ref 26.6–33.0)
MCHC: 34.4 g/dL (ref 31.5–35.7)
MCV: 91 fL (ref 79–97)
Platelets: 283 10*3/uL (ref 150–450)
RBC: 4.65 x10E6/uL (ref 3.77–5.28)
RDW: 12.6 % (ref 11.7–15.4)
WBC: 7.4 10*3/uL (ref 3.4–10.8)

## 2021-07-09 LAB — LIPID PANEL
Chol/HDL Ratio: 3.3 ratio (ref 0.0–4.4)
Cholesterol, Total: 169 mg/dL (ref 100–199)
HDL: 51 mg/dL (ref >39)
LDL Chol Calc (NIH): 88 mg/dL (ref 0–99)
Triglycerides: 174 mg/dL — ABNORMAL HIGH (ref 0–149)
VLDL Cholesterol Cal: 30 mg/dL (ref 5–40)

## 2021-07-09 LAB — CMP14+EGFR
ALT: 37 IU/L — ABNORMAL HIGH (ref 0–32)
AST: 65 IU/L — ABNORMAL HIGH (ref 0–40)
Albumin/Globulin Ratio: 1.6 (ref 1.2–2.2)
Albumin: 4.6 g/dL (ref 3.8–4.9)
Alkaline Phosphatase: 114 IU/L (ref 44–121)
BUN/Creatinine Ratio: 18 (ref 9–23)
BUN: 16 mg/dL (ref 6–24)
Bilirubin Total: 0.4 mg/dL (ref 0.0–1.2)
CO2: 24 mmol/L (ref 20–29)
Calcium: 9.5 mg/dL (ref 8.7–10.2)
Chloride: 100 mmol/L (ref 96–106)
Creatinine, Ser: 0.91 mg/dL (ref 0.57–1.00)
Globulin, Total: 2.8 g/dL (ref 1.5–4.5)
Glucose: 103 mg/dL — ABNORMAL HIGH (ref 70–99)
Potassium: 4.8 mmol/L (ref 3.5–5.2)
Sodium: 139 mmol/L (ref 134–144)
Total Protein: 7.4 g/dL (ref 6.0–8.5)
eGFR: 74 mL/min/{1.73_m2} (ref >59)

## 2021-07-09 LAB — HEMOGLOBIN A1C
Est. average glucose Bld gHb Est-mCnc: 137 mg/dL
Hgb A1c MFr Bld: 6.4 % — ABNORMAL HIGH (ref 4.8–5.6)

## 2021-07-09 LAB — VITAMIN D 25 HYDROXY (VIT D DEFICIENCY, FRACTURES): Vit D, 25-Hydroxy: 35.9 ng/mL (ref 30.0–100.0)

## 2021-07-09 LAB — TSH: TSH: 0.82 u[IU]/mL (ref 0.450–4.500)

## 2021-07-09 NOTE — Assessment & Plan Note (Signed)
onrolled on adderall continue same ?

## 2021-07-09 NOTE — Assessment & Plan Note (Signed)
No residual weakness or numbness, continua statin and aspirin ?

## 2021-07-09 NOTE — Patient Instructions (Addendum)
F/u in  13 weeks, hBA1C, cmp and eGFR non fasting, 3 days before appointment, call if you need me sooner ? ?Nurse pls add hepatitis  panel to recent lab, dx is elevated liver enzymes ? ?You are referred for an [place] of your liver because of abnormal liver enzymes, I will be in touch with results as soon as they are available ? ? ?Work on intermittent fasting , eat between 7am and 7 pm, MAINLY vegetables, fruit and grilled lean preotein, reduce egg yolks and cheese, butter and oil ? ?64 ounces water daily or more ? ?HAPPY you are enjoying exercise finally, keep it going!! ? ?Thanks for choosing  [facility], we consider it a privelige to serve you. ? ? ? ?

## 2021-07-09 NOTE — Assessment & Plan Note (Signed)
Add hepatitis panel, obtain US of liver , if report is " fatty liver" work on weight loss an re eval in 3 months. Need to educate to avoid tylenol. Will need GI input if persists or worsens ?

## 2021-07-09 NOTE — Assessment & Plan Note (Signed)
Controlled, no change in medication  

## 2021-07-09 NOTE — Assessment & Plan Note (Signed)
Hyperlipidemia:Low fat diet discussed and encouraged. ? ? ?Lipid Panel  ?Lab Results  ?Component Value Date  ? CHOL 169 07/08/2021  ? HDL 51 07/08/2021  ? [place] 88 07/08/2021  ? TRIG 174 (H) 07/08/2021  ? CHOLHDL 3.3 07/08/2021  ? ?Needs to reduce egg yolk, cheese, oils and butter  Due to elevated TG ? ? ?

## 2021-07-09 NOTE — Assessment & Plan Note (Signed)
?  Patient re-educated about  the importance of commitment to a  minimum of 150 minutes of exercise per week as able. ? ?The importance of healthy food choices with portion control discussed, as well as eating regularly and within a 12 hour window most days. ?The need to choose "clean , green" food 50 to 75% of the time is discussed, as well as to make water the primary drink and set a goal of 64 ounces water daily. ? ?  ? ?  07/09/2021  ?  9:21 AM 05/28/2021  ? 10:37 AM 05/01/2021  ? 10:46 AM  ?Weight /BMI  ?Weight 164 lb 165 lb 0.6 oz 161 lb 6.4 oz  ?Height '5\' 1"'$  (1.549 m) '5\' 1"'$  (1.549 m) '5\' 1"'$  (1.549 m)  ?BMI 30.99 kg/m2 31.18 kg/m2 30.5 kg/m2  ? ? ? ?

## 2021-07-09 NOTE — Progress Notes (Signed)
? ?Carmen Mcintyre     MRN: [id number]      DOB: Mar 28, 1965 ? ? ?HPI ?Carmen Mcintyre is here for follow up and re-evaluation of chronic medical conditions, medication management and review of any available recent lab and radiology data.  ?Preventive health is updated, specifically  Cancer screening and Immunization.   ?Questions or concerns regarding consultations or procedures which the PT has had in the interim are  addressed. ?The PT denies any adverse reactions to current medications since the last visit.  ?Interested in alternative to [facility] to help with weight loss  ?Now in an exercise class and also yoga which she enjoys , goes 3 days/ week ?Help with mood and generally feels better ? ?ROS ?Denies recent fever or chills. ?Denies sinus pressure, nasal congestion, ear pain or sore throat. ?Denies chest congestion, productive cough or wheezing. ?Denies chest pains, palpitations and leg swelling ?Denies abdominal pain, nausea, vomiting,diarrhea or constipation.   ?Denies dysuria, frequency, hesitancy or incontinence. ?Denies joint pain, swelling and limitation in mobility. ?Denies headaches, seizures, numbness, or tingling. ?Denies depression, anxiety or insomnia. ?Denies skin break down or rash. ? ? ?PE ? ?BP 135/82   Pulse 82   Resp 16   Ht '5\' 1"'$  (1.549 m)   Wt 164 lb (74.4 kg)   LMP 08/22/2016   SpO2 96%   BMI 30.99 kg/m?  ? ?Patient alert and oriented and in no cardiopulmonary distress. ? ?HEENT: No facial asymmetry, EOMI,     Neck supple . ? ?Chest: Clear to auscultation bilaterally. ? ?CVS: S1, S2 no murmurs, no S3.Regular rate. ? ?.  ? ?Ext: No edema ? ?MS: Adequate ROM spine, shoulders, hips and knees. ? ?Skin: Intact, no ulcerations or rash noted. ? ?Psych: Good eye contact, normal affect. Memory intact not anxious or depressed appearing. ? ?CNS: CN 2-12 intact, power,  normal throughout.no focal deficits noted. ? ? ?Assessment & Plan ? ?Type 2 diabetes mellitus with vascular disease ([place]) ?Carmen Mcintyre is  reminded of the importance of commitment to daily physical activity for 30 minutes or more, as able and the need to limit carbohydrate intake to 30 to 60 grams per meal to help with blood sugar control.  ? ?The need to take medication as prescribed, test blood sugar as directed, and to call between visits if there is a concern that blood sugar is uncontrolled is also discussed.  ? ?Carmen Mcintyre is reminded of the importance of daily foot exam, annual eye examination, and good blood sugar, blood pressure and cholesterol control. ? ? ?  Latest Ref Rng & Units 07/08/2021  ?  2:32 PM 02/22/2021  ? 11:10 AM 11/20/2020  ?  8:43 AM 07/17/2020  ?  1:37 PM 08/11/2019  ? 11:51 AM  ?Diabetic Labs  ?HbA1c 4.8 - 5.6 % 6.4   5.6   6.7   6.6   5.9    ?Chol 100 - 199 mg/dL 169    153   162   146    ?HDL >39 mg/dL 51    48   64   53    ?Calc LDL 0 - 99 mg/dL 88    81   81   74    ?Triglycerides 0 - 149 mg/dL 174    138   93   102    ?Creatinine 0.57 - 1.00 mg/dL 0.91   0.70   0.80   0.84   0.71    ? ? ?  07/09/2021  ?  9:21 AM 05/28/2021  ? 10:37 AM 05/01/2021  ? 10:46 AM 02/26/2021  ?  4:47 PM 02/22/2021  ? 11:20 AM 01/04/2021  ?  9:19 AM 11/20/2020  ?  8:03 AM  ?BP/Weight  ?Systolic BP 962 952 841 324 122 117 131  ?Diastolic BP 82 85 82 76 73 72 79  ?Wt. (Lbs) 164 165.04 161.4  163 165.12 173.12  ?BMI 30.99 kg/m2 31.18 kg/m2 30.5 kg/m2  30.8 kg/m2 31.2 kg/m2 32.71 kg/m2  ? ? ?  Latest Ref Rng & Units 03/19/2021  ? 12:00 AM  ?Foot/eye exam completion dates  ?Eye Exam No Retinopathy No Retinopathy       ?  ? This result is from an external source.  ? ? ?No med change, sub optimal control ? ? ? ?Hyperlipidemia LDL goal <70 ?Hyperlipidemia:Low fat diet discussed and encouraged. ? ? ?Lipid Panel  ?Lab Results  ?Component Value Date  ? CHOL 169 07/08/2021  ? HDL 51 07/08/2021  ? [place] 88 07/08/2021  ? TRIG 174 (H) 07/08/2021  ? CHOLHDL 3.3 07/08/2021  ? ?Needs to reduce egg yolk, cheese, oils and butter  Due to elevated TG ? ? ? ?Depression, major,  single episode, in partial remission ([place]) ?Controlled, no change in medication ? ? ?Elevated liver enzymes ?Add hepatitis panel, obtain US of liver , if report is " fatty liver" work on weight loss an re eval in 3 months. Need to educate to avoid tylenol. Will need GI input if persists or worsens ? ?CHRONIC FATIGUE SYNDROME ?onrolled on adderall continue same ? ?Overweight (BMI 25.0-29.9) ? ?Patient re-educated about  the importance of commitment to a  minimum of 150 minutes of exercise per week as able. ? ?The importance of healthy food choices with portion control discussed, as well as eating regularly and within a 12 hour window most days. ?The need to choose "clean , green" food 50 to 75% of the time is discussed, as well as to make water the primary drink and set a goal of 64 ounces water daily. ? ?  ? ?  07/09/2021  ?  9:21 AM 05/28/2021  ? 10:37 AM 05/01/2021  ? 10:46 AM  ?Weight /BMI  ?Weight 164 lb 165 lb 0.6 oz 161 lb 6.4 oz  ?Height '5\' 1"'$  (1.549 m) '5\' 1"'$  (1.549 m) '5\' 1"'$  (1.549 m)  ?BMI 30.99 kg/m2 31.18 kg/m2 30.5 kg/m2  ? ? ? ? ?Stroke (cerebrum) ([place]) ?No residual weakness or numbness, continua statin and aspirin ? ?

## 2021-07-09 NOTE — Assessment & Plan Note (Signed)
Ms. Dubow is reminded of the importance of commitment to daily physical activity for 30 minutes or more, as able and the need to limit carbohydrate intake to 30 to 60 grams per meal to help with blood sugar control.  ? ?The need to take medication as prescribed, test blood sugar as directed, and to call between visits if there is a concern that blood sugar is uncontrolled is also discussed.  ? ?Ms. Sine is reminded of the importance of daily foot exam, annual eye examination, and good blood sugar, blood pressure and cholesterol control. ? ? ?  Latest Ref Rng & Units 07/08/2021  ?  2:32 PM 02/22/2021  ? 11:10 AM 11/20/2020  ?  8:43 AM 07/17/2020  ?  1:37 PM 08/11/2019  ? 11:51 AM  ?Diabetic Labs  ?HbA1c 4.8 - 5.6 % 6.4   5.6   6.7   6.6   5.9    ?Chol 100 - 199 mg/dL 169    153   162   146    ?HDL >39 mg/dL 51    48   64   53    ?Calc LDL 0 - 99 mg/dL 88    81   81   74    ?Triglycerides 0 - 149 mg/dL 174    138   93   102    ?Creatinine 0.57 - 1.00 mg/dL 0.91   0.70   0.80   0.84   0.71    ? ? ?  07/09/2021  ?  9:21 AM 05/28/2021  ? 10:37 AM 05/01/2021  ? 10:46 AM 02/26/2021  ?  4:47 PM 02/22/2021  ? 11:20 AM 01/04/2021  ?  9:19 AM 11/20/2020  ?  8:03 AM  ?BP/Weight  ?Systolic BP 626 948 546 270 122 117 131  ?Diastolic BP 82 85 82 76 73 72 79  ?Wt. (Lbs) 164 165.04 161.4  163 165.12 173.12  ?BMI 30.99 kg/m2 31.18 kg/m2 30.5 kg/m2  30.8 kg/m2 31.2 kg/m2 32.71 kg/m2  ? ? ?  Latest Ref Rng & Units 03/19/2021  ? 12:00 AM  ?Foot/eye exam completion dates  ?Eye Exam No Retinopathy No Retinopathy       ?  ? This result is from an external source.  ? ? ?No med change, sub optimal control ? ? ?

## 2021-07-10 LAB — ACUTE VIRAL HEPATITIS (HAV, HBV, HCV)
HCV Ab: NONREACTIVE
Hep A IgM: NEGATIVE
Hep B C IgM: NEGATIVE
Hepatitis B Surface Ag: NEGATIVE

## 2021-07-10 LAB — HCV INTERPRETATION

## 2021-07-10 LAB — SPECIMEN STATUS REPORT

## 2021-07-24 ENCOUNTER — Ambulatory Visit (HOSPITAL_COMMUNITY): Payer: 59

## 2021-07-29 ENCOUNTER — Encounter: Payer: Self-pay | Admitting: Family Medicine

## 2021-07-29 ENCOUNTER — Other Ambulatory Visit: Payer: Self-pay

## 2021-07-29 DIAGNOSIS — E1159 Type 2 diabetes mellitus with other circulatory complications: Secondary | ICD-10-CM

## 2021-07-30 ENCOUNTER — Other Ambulatory Visit (HOSPITAL_COMMUNITY): Payer: Self-pay

## 2021-07-30 DIAGNOSIS — E1159 Type 2 diabetes mellitus with other circulatory complications: Secondary | ICD-10-CM | POA: Diagnosis not present

## 2021-07-31 ENCOUNTER — Encounter: Payer: Self-pay | Admitting: Family Medicine

## 2021-07-31 ENCOUNTER — Other Ambulatory Visit: Payer: Self-pay | Admitting: Family Medicine

## 2021-07-31 ENCOUNTER — Other Ambulatory Visit (HOSPITAL_COMMUNITY): Payer: Self-pay

## 2021-07-31 LAB — HEPATIC FUNCTION PANEL
ALT: 17 IU/L (ref 0–32)
AST: 18 IU/L (ref 0–40)
Albumin: 4.7 g/dL (ref 3.8–4.9)
Alkaline Phosphatase: 112 IU/L (ref 44–121)
Bilirubin Total: 0.4 mg/dL (ref 0.0–1.2)
Bilirubin, Direct: 0.12 mg/dL (ref 0.00–0.40)
Total Protein: 7.5 g/dL (ref 6.0–8.5)

## 2021-07-31 MED ORDER — SEMAGLUTIDE-WEIGHT MANAGEMENT 0.25 MG/0.5ML ~~LOC~~ SOAJ
0.2500 mg | SUBCUTANEOUS | 0 refills | Status: DC
  Filled 2021-07-31 – 2021-08-30 (×4): qty 2, 28d supply, fill #0

## 2021-08-12 ENCOUNTER — Other Ambulatory Visit (HOSPITAL_COMMUNITY): Payer: Self-pay

## 2021-08-15 ENCOUNTER — Other Ambulatory Visit (HOSPITAL_COMMUNITY): Payer: Self-pay

## 2021-08-16 ENCOUNTER — Other Ambulatory Visit (HOSPITAL_COMMUNITY): Payer: Self-pay

## 2021-08-20 ENCOUNTER — Encounter: Payer: Self-pay | Admitting: Family Medicine

## 2021-08-20 NOTE — Telephone Encounter (Signed)
Patient left voice mail asking if nurse has seen her mychart message that was sent in this week, please return call [phone].

## 2021-08-20 NOTE — Telephone Encounter (Signed)
See other mychart msg

## 2021-08-22 ENCOUNTER — Telehealth: Payer: Self-pay | Admitting: Family Medicine

## 2021-08-22 ENCOUNTER — Other Ambulatory Visit (HOSPITAL_COMMUNITY): Payer: Self-pay

## 2021-08-22 NOTE — Telephone Encounter (Signed)
Katie responded to her in [place]. (If she will read the response and let us know)

## 2021-08-22 NOTE — Telephone Encounter (Signed)
Pt called wanting to check back about samples Carmen Mcintyre was checking on

## 2021-08-23 ENCOUNTER — Other Ambulatory Visit: Payer: Self-pay | Admitting: Family Medicine

## 2021-08-29 ENCOUNTER — Other Ambulatory Visit (HOSPITAL_COMMUNITY): Payer: Self-pay

## 2021-08-30 ENCOUNTER — Other Ambulatory Visit (HOSPITAL_COMMUNITY): Payer: Self-pay

## 2021-09-10 ENCOUNTER — Other Ambulatory Visit: Payer: Self-pay | Admitting: Family Medicine

## 2021-09-11 ENCOUNTER — Other Ambulatory Visit: Payer: Self-pay

## 2021-09-11 ENCOUNTER — Other Ambulatory Visit (HOSPITAL_COMMUNITY): Payer: Self-pay

## 2021-09-11 ENCOUNTER — Telehealth: Payer: Self-pay

## 2021-09-11 ENCOUNTER — Other Ambulatory Visit: Payer: Self-pay | Admitting: Family Medicine

## 2021-09-11 MED ORDER — VENLAFAXINE HCL ER 75 MG PO CP24
75.0000 mg | ORAL_CAPSULE | Freq: Every day | ORAL | 0 refills | Status: DC
  Filled 2021-09-11: qty 90, 90d supply, fill #0

## 2021-09-11 MED ORDER — VENLAFAXINE HCL ER 75 MG PO CP24: 75.0000 mg | ORAL_CAPSULE | Freq: Every day | ORAL | 0 refills | Status: DC

## 2021-09-11 NOTE — Telephone Encounter (Signed)
Patient called need med refills, please call patient back today and let her know if be filled today or not.   Rx #: [id number]  venlafaxine XR (EFFEXOR XR) 75 MG 24 hr capsule  Pharmacy:  [facility]

## 2021-09-11 NOTE — Telephone Encounter (Signed)
Med refilled to [facility]

## 2021-09-13 ENCOUNTER — Other Ambulatory Visit (HOSPITAL_COMMUNITY): Payer: Self-pay

## 2021-09-17 ENCOUNTER — Other Ambulatory Visit (HOSPITAL_COMMUNITY): Payer: Self-pay

## 2021-09-18 ENCOUNTER — Other Ambulatory Visit (HOSPITAL_COMMUNITY): Payer: Self-pay

## 2021-09-18 ENCOUNTER — Encounter: Payer: Self-pay | Admitting: Family Medicine

## 2021-09-18 ENCOUNTER — Other Ambulatory Visit: Payer: Self-pay | Admitting: Family Medicine

## 2021-09-18 MED ORDER — SEMAGLUTIDE-WEIGHT MANAGEMENT 1 MG/0.5ML ~~LOC~~ SOAJ
1.0000 mg | SUBCUTANEOUS | 0 refills | Status: DC
Start: 2021-09-18 — End: 2021-10-29
  Filled 2021-09-18: qty 2, 28d supply, fill #0

## 2021-09-20 ENCOUNTER — Telehealth: Payer: Self-pay | Admitting: Family Medicine

## 2021-09-20 ENCOUNTER — Other Ambulatory Visit (HOSPITAL_COMMUNITY): Payer: Self-pay

## 2021-09-20 NOTE — Telephone Encounter (Signed)
Patient needs refill  amphetamine-dextroamphetamine (ADDERALL XR) 30 MG 24 hr capsule   amphetamine-dextroamphetamine (ADDERALL) 10 MG tablet

## 2021-09-23 ENCOUNTER — Other Ambulatory Visit: Payer: Self-pay | Admitting: Family Medicine

## 2021-09-23 MED ORDER — AMPHETAMINE-DEXTROAMPHETAMINE 10 MG PO TABS
ORAL_TABLET | ORAL | 0 refills | Status: DC
  Filled 2021-09-23: qty 90, 90d supply, fill #0

## 2021-09-23 MED ORDER — AMPHETAMINE-DEXTROAMPHET ER 30 MG PO CP24
30.0000 mg | ORAL_CAPSULE | ORAL | 0 refills | Status: DC
  Filled 2021-09-23: qty 90, 90d supply, fill #0

## 2021-09-24 ENCOUNTER — Other Ambulatory Visit (HOSPITAL_COMMUNITY): Payer: Self-pay

## 2021-09-27 ENCOUNTER — Other Ambulatory Visit (HOSPITAL_COMMUNITY): Payer: Self-pay

## 2021-09-30 ENCOUNTER — Other Ambulatory Visit: Payer: Self-pay | Admitting: Family Medicine

## 2021-09-30 ENCOUNTER — Other Ambulatory Visit (HOSPITAL_COMMUNITY): Payer: Self-pay

## 2021-10-01 ENCOUNTER — Other Ambulatory Visit (HOSPITAL_COMMUNITY): Payer: Self-pay

## 2021-10-01 MED ORDER — LISINOPRIL 20 MG PO TABS
20.0000 mg | ORAL_TABLET | Freq: Every day | ORAL | 0 refills | Status: DC
  Filled 2021-10-01: qty 90, 90d supply, fill #0

## 2021-10-07 ENCOUNTER — Other Ambulatory Visit: Payer: Self-pay | Admitting: Family Medicine

## 2021-10-07 ENCOUNTER — Encounter: Payer: Self-pay | Admitting: Family Medicine

## 2021-10-07 ENCOUNTER — Other Ambulatory Visit (HOSPITAL_COMMUNITY): Payer: Self-pay

## 2021-10-07 ENCOUNTER — Other Ambulatory Visit: Payer: Self-pay | Admitting: Internal Medicine

## 2021-10-07 DIAGNOSIS — E663 Overweight: Secondary | ICD-10-CM

## 2021-10-07 DIAGNOSIS — E669 Obesity, unspecified: Secondary | ICD-10-CM

## 2021-10-07 MED ORDER — WEGOVY 0.5 MG/0.5ML ~~LOC~~ SOAJ
0.5000 mg | SUBCUTANEOUS | 0 refills | Status: DC
  Filled 2021-10-07: qty 2, fill #0

## 2021-10-07 MED ORDER — WEGOVY 0.25 MG/0.5ML ~~LOC~~ SOAJ
0.2500 mg | SUBCUTANEOUS | 0 refills | Status: DC
  Filled 2021-10-07: qty 2, 28d supply, fill #0

## 2021-10-07 NOTE — Addendum Note (Signed)
Addended byIhor Dow on: 10/07/2021 12:27 PM   Modules accepted: Orders

## 2021-10-08 ENCOUNTER — Other Ambulatory Visit (HOSPITAL_COMMUNITY): Payer: Self-pay

## 2021-10-09 ENCOUNTER — Other Ambulatory Visit: Payer: Self-pay | Admitting: Family Medicine

## 2021-10-09 ENCOUNTER — Other Ambulatory Visit (HOSPITAL_COMMUNITY): Payer: Self-pay

## 2021-10-09 MED FILL — Venlafaxine HCl Cap ER 24HR 75 MG (Base Equivalent): ORAL | 90 days supply | Qty: 90 | Fill #0 | Status: AC

## 2021-10-18 ENCOUNTER — Other Ambulatory Visit (HOSPITAL_COMMUNITY): Payer: Self-pay

## 2021-10-28 ENCOUNTER — Other Ambulatory Visit: Payer: Self-pay

## 2021-10-28 DIAGNOSIS — R748 Abnormal levels of other serum enzymes: Secondary | ICD-10-CM | POA: Diagnosis not present

## 2021-10-28 DIAGNOSIS — E1159 Type 2 diabetes mellitus with other circulatory complications: Secondary | ICD-10-CM | POA: Diagnosis not present

## 2021-10-29 ENCOUNTER — Encounter: Payer: Self-pay | Admitting: Family Medicine

## 2021-10-29 ENCOUNTER — Other Ambulatory Visit (HOSPITAL_COMMUNITY): Payer: Self-pay

## 2021-10-29 ENCOUNTER — Ambulatory Visit: Payer: 59 | Admitting: Family Medicine

## 2021-10-29 ENCOUNTER — Other Ambulatory Visit: Payer: Self-pay | Admitting: Family Medicine

## 2021-10-29 ENCOUNTER — Telehealth: Payer: Self-pay | Admitting: Family Medicine

## 2021-10-29 VITALS — BP 126/78 | HR 89 | Ht 61.0 in | Wt 159.0 lb

## 2021-10-29 DIAGNOSIS — G9332 Myalgic encephalomyelitis/chronic fatigue syndrome: Secondary | ICD-10-CM

## 2021-10-29 DIAGNOSIS — E663 Overweight: Secondary | ICD-10-CM | POA: Diagnosis not present

## 2021-10-29 DIAGNOSIS — E1159 Type 2 diabetes mellitus with other circulatory complications: Secondary | ICD-10-CM

## 2021-10-29 DIAGNOSIS — Z1231 Encounter for screening mammogram for malignant neoplasm of breast: Secondary | ICD-10-CM

## 2021-10-29 DIAGNOSIS — I10 Essential (primary) hypertension: Secondary | ICD-10-CM

## 2021-10-29 DIAGNOSIS — E785 Hyperlipidemia, unspecified: Secondary | ICD-10-CM

## 2021-10-29 LAB — CMP14+EGFR
ALT: 16 IU/L (ref 0–32)
AST: 16 IU/L (ref 0–40)
Albumin/Globulin Ratio: 1.5 (ref 1.2–2.2)
Albumin: 4.5 g/dL (ref 3.8–4.9)
Alkaline Phosphatase: 125 IU/L — ABNORMAL HIGH (ref 44–121)
BUN/Creatinine Ratio: 15 (ref 9–23)
BUN: 12 mg/dL (ref 6–24)
Bilirubin Total: 0.5 mg/dL (ref 0.0–1.2)
CO2: 24 mmol/L (ref 20–29)
Calcium: 9.6 mg/dL (ref 8.7–10.2)
Chloride: 105 mmol/L (ref 96–106)
Creatinine, Ser: 0.78 mg/dL (ref 0.57–1.00)
Globulin, Total: 3 g/dL (ref 1.5–4.5)
Glucose: 92 mg/dL (ref 70–99)
Potassium: 5.1 mmol/L (ref 3.5–5.2)
Sodium: 141 mmol/L (ref 134–144)
Total Protein: 7.5 g/dL (ref 6.0–8.5)
eGFR: 89 mL/min/{1.73_m2} (ref >59)

## 2021-10-29 LAB — HEMOGLOBIN A1C
Est. average glucose Bld gHb Est-mCnc: 123 mg/dL
Hgb A1c MFr Bld: 5.9 % — ABNORMAL HIGH (ref 4.8–5.6)

## 2021-10-29 MED ORDER — AMPHETAMINE-DEXTROAMPHETAMINE 10 MG PO TABS: ORAL_TABLET | ORAL | 0 refills | Status: DC

## 2021-10-29 MED ORDER — AMPHETAMINE-DEXTROAMPHET ER 30 MG PO CP24: 30.0000 mg | ORAL_CAPSULE | ORAL | 0 refills | Status: DC

## 2021-10-29 MED ORDER — WEGOVY 0.25 MG/0.5ML ~~LOC~~ SOAJ
0.2500 mg | SUBCUTANEOUS | 1 refills | Status: DC
Start: 2021-10-29 — End: 2021-12-05
  Filled 2021-10-29: qty 6, 84d supply, fill #0
  Filled 2021-11-06: qty 2, 28d supply, fill #0

## 2021-10-29 NOTE — Assessment & Plan Note (Signed)
improved  Patient re-educated about  the importance of commitment to a  minimum of 150 minutes of exercise per week as able.  The importance of healthy food choices with portion control discussed, as well as eating regularly and within a 12 hour window most days. The need to choose "clean , green" food 50 to 75% of the time is discussed, as well as to make water the primary drink and set a goal of 64 ounces water daily.       10/29/2021    9:47 AM 07/09/2021    9:21 AM 05/28/2021   10:37 AM  Weight /BMI  Weight 159 lb 164 lb 165 lb 0.6 oz  Height '5\' 1"'$  (1.549 m) '5\' 1"'$  (1.549 m) '5\' 1"'$  (1.549 m)  BMI 30.04 kg/m2 30.99 kg/m2 31.18 kg/m2

## 2021-10-29 NOTE — Progress Notes (Signed)
Carmen Mcintyre     MRN: [id number]      DOB: 1965-12-03   HPI Carmen Mcintyre is here for follow up and re-evaluation of chronic medical conditions, medication management and review of any available recent lab and radiology data.  Preventive health is updated, specifically  Cancer screening and Immunization.   Questions or concerns regarding consultations or procedures which the PT has had in the interim are  addressed. The PT denies any adverse reactions to current medications since the last visit.  There are no new concerns.  There are no specific complaints   ROS Denies recent fever or chills. Denies sinus pressure, nasal congestion, ear pain or sore throat. Denies chest congestion, productive cough or wheezing. Denies chest pains, palpitations and leg swelling Denies abdominal pain, nausea, vomiting,diarrhea or constipation.   Denies dysuria, frequency, hesitancy or incontinence. Denies joint pain, swelling and limitation in mobility. Denies headaches, seizures, numbness, or tingling. Denies depression, anxiety or insomnia. Denies skin break down or rash.   PE  BP 126/78 (BP Location: Left Arm, Patient Position: Sitting, Cuff Size: Large)   Pulse 89   Ht '5\' 1"'$  (1.549 m)   Wt 159 lb (72.1 kg)   LMP 08/22/2016   SpO2 97%   BMI 30.04 kg/m   Patient alert and oriented and in no cardiopulmonary distress.  HEENT: No facial asymmetry, EOMI,     Neck supple .  Chest: Clear to auscultation bilaterally.  CVS: S1, S2 no murmurs, no S3.Regular rate.  ABD: Soft non tender.   Ext: No edema  MS: Adequate ROM spine, shoulders, hips and knees.  Skin: Intact, no ulcerations or rash noted.  Psych: Good eye contact, normal affect. Memory intact not anxious or depressed appearing.  CNS: CN 2-12 intact, power,  normal throughout.no focal deficits noted.   Assessment & Plan  HTN (hypertension) Controlled, no change in medication DASH diet and commitment to daily physical activity  for a minimum of 30 minutes discussed and encouraged, as a part of hypertension management. The importance of attaining a healthy weight is also discussed.     10/29/2021    9:47 AM 07/09/2021    9:21 AM 05/28/2021   10:37 AM 05/01/2021   10:46 AM 02/26/2021    4:47 PM 02/22/2021   11:20 AM 01/04/2021    9:19 AM  BP/Weight  Systolic BP 536 644 034 742 595 638 756  Diastolic BP 78 82 85 82 76 73 72  Wt. (Lbs) 159 164 165.04 161.4  163 165.12  BMI 30.04 kg/m2 30.99 kg/m2 31.18 kg/m2 30.5 kg/m2  30.8 kg/m2 31.2 kg/m2       Type 2 diabetes mellitus with vascular disease ([place]) Carmen Mcintyre is reminded of the importance of commitment to daily physical activity for 30 minutes or more, as able and the need to limit carbohydrate intake to 30 to 60 grams per meal to help with blood sugar control.  Improved  Carmen Mcintyre is reminded of the importance of daily foot exam, annual eye examination, and good blood sugar, blood pressure and cholesterol control.     Latest Ref Rng & Units 10/28/2021    2:09 PM 07/08/2021    2:32 PM 02/22/2021   11:10 AM 11/20/2020    8:43 AM 07/17/2020    1:37 PM  Diabetic Labs  HbA1c 4.8 - 5.6 % 5.9  6.4  5.6  6.7  6.6   Chol 100 - 199 mg/dL  169   153  162  HDL >39 mg/dL  51   48  64   Calc LDL 0 - 99 mg/dL  88   81  81   Triglycerides 0 - 149 mg/dL  174   138  93   Creatinine 0.57 - 1.00 mg/dL 0.78  0.91  0.70  0.80  0.84       10/29/2021    9:47 AM 07/09/2021    9:21 AM 05/28/2021   10:37 AM 05/01/2021   10:46 AM 02/26/2021    4:47 PM 02/22/2021   11:20 AM 01/04/2021    9:19 AM  BP/Weight  Systolic BP 397 673 419 379 024 097 353  Diastolic BP 78 82 85 82 76 73 72  Wt. (Lbs) 159 164 165.04 161.4  163 165.12  BMI 30.04 kg/m2 30.99 kg/m2 31.18 kg/m2 30.5 kg/m2  30.8 kg/m2 31.2 kg/m2      Latest Ref Rng & Units 03/19/2021   12:00 AM  Foot/eye exam completion dates  Eye Exam No Retinopathy No Retinopathy         This result is from an external source.       Updated lab needed at/ before next visit.   Overweight (BMI 25.0-29.9) improved  Patient re-educated about  the importance of commitment to a  minimum of 150 minutes of exercise per week as able.  The importance of healthy food choices with portion control discussed, as well as eating regularly and within a 12 hour window most days. The need to choose "clean , green" food 50 to 75% of the time is discussed, as well as to make water the primary drink and set a goal of 64 ounces water daily.       10/29/2021    9:47 AM 07/09/2021    9:21 AM 05/28/2021   10:37 AM  Weight /BMI  Weight 159 lb 164 lb 165 lb 0.6 oz  Height '5\' 1"'$  (1.549 m) '5\' 1"'$  (1.549 m) '5\' 1"'$  (1.549 m)  BMI 30.04 kg/m2 30.99 kg/m2 31.18 kg/m2      Chronic fatigue disorder Controlled, no change in medication   Hyperlipidemia LDL goal <70 Hyperlipidemia:Low fat diet discussed and encouraged.   Lipid Panel  Lab Results  Component Value Date   CHOL 169 07/08/2021   HDL 51 07/08/2021   LDLCALC 88 07/08/2021   TRIG 174 (H) 07/08/2021   CHOLHDL 3.3 07/08/2021   Updated lab needed at/ before next visit.

## 2021-10-29 NOTE — Assessment & Plan Note (Signed)
Controlled, no change in medication DASH diet and commitment to daily physical activity for a minimum of 30 minutes discussed and encouraged, as a part of hypertension management. The importance of attaining a healthy weight is also discussed.     10/29/2021    9:47 AM 07/09/2021    9:21 AM 05/28/2021   10:37 AM 05/01/2021   10:46 AM 02/26/2021    4:47 PM 02/22/2021   11:20 AM 01/04/2021    9:19 AM  BP/Weight  Systolic BP 239 532 023 343 568 616 837  Diastolic BP 78 82 85 82 76 73 72  Wt. (Lbs) 159 164 165.04 161.4  163 165.12  BMI 30.04 kg/m2 30.99 kg/m2 31.18 kg/m2 30.5 kg/m2  30.8 kg/m2 31.2 kg/m2

## 2021-10-29 NOTE — Assessment & Plan Note (Signed)
Controlled, no change in medication  

## 2021-10-29 NOTE — Assessment & Plan Note (Addendum)
Carmen Mcintyre is reminded of the importance of commitment to daily physical activity for 30 minutes or more, as able and the need to limit carbohydrate intake to 30 to 60 grams per meal to help with blood sugar control.  Improved  Carmen Mcintyre is reminded of the importance of daily foot exam, annual eye examination, and good blood sugar, blood pressure and cholesterol control.     Latest Ref Rng & Units 10/28/2021    2:09 PM 07/08/2021    2:32 PM 02/22/2021   11:10 AM 11/20/2020    8:43 AM 07/17/2020    1:37 PM  Diabetic Labs  HbA1c 4.8 - 5.6 % 5.9  6.4  5.6  6.7  6.6   Chol 100 - 199 mg/dL  169   153  162   HDL >39 mg/dL  51   48  64   Calc LDL 0 - 99 mg/dL  88   81  81   Triglycerides 0 - 149 mg/dL  174   138  93   Creatinine 0.57 - 1.00 mg/dL 0.78  0.91  0.70  0.80  0.84       10/29/2021    9:47 AM 07/09/2021    9:21 AM 05/28/2021   10:37 AM 05/01/2021   10:46 AM 02/26/2021    4:47 PM 02/22/2021   11:20 AM 01/04/2021    9:19 AM  BP/Weight  Systolic BP 409 735 329 924 268 341 962  Diastolic BP 78 82 85 82 76 73 72  Wt. (Lbs) 159 164 165.04 161.4  163 165.12  BMI 30.04 kg/m2 30.99 kg/m2 31.18 kg/m2 30.5 kg/m2  30.8 kg/m2 31.2 kg/m2      Latest Ref Rng & Units 03/19/2021   12:00 AM  Foot/eye exam completion dates  Eye Exam No Retinopathy No Retinopathy         This result is from an external source.      Updated lab needed at/ before next visit.

## 2021-10-29 NOTE — Assessment & Plan Note (Signed)
Hyperlipidemia:Low fat diet discussed and encouraged.   Lipid Panel  Lab Results  Component Value Date   CHOL 169 07/08/2021   HDL 51 07/08/2021   LDLCALC 88 07/08/2021   TRIG 174 (H) 07/08/2021   CHOLHDL 3.3 07/08/2021   Updated lab needed at/ before next visit.

## 2021-10-29 NOTE — Telephone Encounter (Signed)
Needs fasting lipid, cmp and EGFr and HBa1C 3 days before next visit, plsorder, I am sending her a pt message , I did not put it on d/c summary Thanks

## 2021-10-29 NOTE — Patient Instructions (Signed)
F/U mid December, call if you need me sooner  Congrats on improved labs and weight loss, keep this up   It is important that you exercise regularly at least 30 minutes 5 times a week. If you develop chest pain, have severe difficulty breathing, or feel very tired, stop exercising immediately and seek medical attention   Thanks for choosing [facility], we consider it a privelige to serve you.

## 2021-10-30 ENCOUNTER — Other Ambulatory Visit: Payer: Self-pay | Admitting: Family Medicine

## 2021-10-30 ENCOUNTER — Other Ambulatory Visit (HOSPITAL_COMMUNITY): Payer: Self-pay

## 2021-10-30 DIAGNOSIS — E785 Hyperlipidemia, unspecified: Secondary | ICD-10-CM

## 2021-10-30 DIAGNOSIS — E1159 Type 2 diabetes mellitus with other circulatory complications: Secondary | ICD-10-CM

## 2021-10-30 NOTE — Telephone Encounter (Signed)
Labs ordered.

## 2021-11-06 ENCOUNTER — Other Ambulatory Visit: Payer: Self-pay | Admitting: Family Medicine

## 2021-11-06 ENCOUNTER — Other Ambulatory Visit (HOSPITAL_COMMUNITY): Payer: Self-pay

## 2021-11-06 MED ORDER — ATORVASTATIN CALCIUM 20 MG PO TABS
ORAL_TABLET | Freq: Every day | ORAL | 3 refills | Status: DC
  Filled 2021-11-06: qty 90, 90d supply, fill #0

## 2021-11-26 ENCOUNTER — Ambulatory Visit: Payer: 59

## 2021-12-05 ENCOUNTER — Encounter: Payer: Self-pay | Admitting: Family Medicine

## 2021-12-05 ENCOUNTER — Other Ambulatory Visit: Payer: Self-pay

## 2021-12-05 MED ORDER — VENLAFAXINE HCL ER 75 MG PO CP24: 75.0000 mg | ORAL_CAPSULE | Freq: Every day | ORAL | 2 refills | Status: DC

## 2021-12-05 MED ORDER — WEGOVY 0.25 MG/0.5ML ~~LOC~~ SOAJ: 0.2500 mg | SUBCUTANEOUS | 1 refills | Status: DC

## 2021-12-05 MED ORDER — LISINOPRIL 20 MG PO TABS: 20.0000 mg | ORAL_TABLET | Freq: Every day | ORAL | 2 refills | Status: DC

## 2021-12-05 MED ORDER — ATORVASTATIN CALCIUM 20 MG PO TABS
ORAL_TABLET | Freq: Every day | ORAL | 2 refills | Status: DC
Start: 2021-12-05 — End: 2023-01-21

## 2021-12-06 NOTE — Telephone Encounter (Signed)
Please advise 

## 2021-12-10 ENCOUNTER — Other Ambulatory Visit (HOSPITAL_COMMUNITY)
Admission: RE | Admit: 2021-12-10 | Discharge: 2021-12-10 | Disposition: A | Discharge status: Home / Self Care | Payer: No Typology Code available for payment source | Source: Ambulatory Visit | Attending: Adult Health | Admitting: Adult Health

## 2021-12-10 ENCOUNTER — Ambulatory Visit (INDEPENDENT_AMBULATORY_CARE_PROVIDER_SITE_OTHER): Payer: No Typology Code available for payment source | Admitting: Adult Health

## 2021-12-10 ENCOUNTER — Encounter: Payer: Self-pay | Admitting: Adult Health

## 2021-12-10 ENCOUNTER — Other Ambulatory Visit: Payer: Self-pay | Admitting: Family Medicine

## 2021-12-10 VITALS — BP 130/82 | HR 92 | Ht 60.0 in | Wt 160.5 lb

## 2021-12-10 DIAGNOSIS — N941 Unspecified dyspareunia: Secondary | ICD-10-CM | POA: Diagnosis not present

## 2021-12-10 DIAGNOSIS — N951 Menopausal and female climacteric states: Secondary | ICD-10-CM

## 2021-12-10 DIAGNOSIS — Z1211 Encounter for screening for malignant neoplasm of colon: Secondary | ICD-10-CM | POA: Diagnosis not present

## 2021-12-10 DIAGNOSIS — Z01419 Encounter for gynecological examination (general) (routine) without abnormal findings: Secondary | ICD-10-CM | POA: Insufficient documentation

## 2021-12-10 DIAGNOSIS — Z124 Encounter for screening for malignant neoplasm of cervix: Secondary | ICD-10-CM

## 2021-12-10 LAB — HEMOCCULT GUIAC POC 1CARD (OFFICE): Fecal Occult Blood, POC: NEGATIVE

## 2021-12-10 MED ORDER — PREMARIN 0.625 MG/GM VA CREA: TOPICAL_CREAM | VAGINAL | 1 refills | Status: DC

## 2021-12-10 MED ORDER — AMPHETAMINE-DEXTROAMPHETAMINE 10 MG PO TABS: ORAL_TABLET | ORAL | 0 refills | Status: DC

## 2021-12-10 MED ORDER — AMPHETAMINE-DEXTROAMPHET ER 30 MG PO CP24: 30.0000 mg | ORAL_CAPSULE | ORAL | 0 refills | Status: DC

## 2021-12-10 NOTE — Progress Notes (Signed)
Patient ID: Carmen Mcintyre, female   DOB: [birth year], 56 y.o.   MRN: [id number] History of Present Illness: Carmen Mcintyre is a 56 year old white female, married, PM in for gyn exam and pap. She is complaining of vaginal dryness and pain with sex. She has retired, and so has her husband and they are traveling more. She had a stroke years ago.  PCP is Dr Moshe Cipro.   Current Medications, Allergies, Past Medical History, Past Surgical History, Family History and Social History were reviewed in [employer] record.     Review of Systems: Patient denies any headaches, hearing loss, fatigue, blurred vision, shortness of breath, chest pain, abdominal pain, problems with bowel movements, urination.No joint pain or mood swings.  She denies any vaginal bleeding +vaginal dryness and pain with sex    Physical Exam:BP 130/82 (BP Location: Left Arm, Patient Position: Sitting, Cuff Size: Normal)   Pulse 92   Ht 5' (1.524 m)   Wt 160 lb 8 oz (72.8 kg)   LMP 08/22/2016   BMI 31.35 kg/m   General:  Well developed, well nourished, no acute distress Skin:  Warm and dry Neck:  Midline trachea, normal thyroid, good ROM, no lymphadenopathy Lungs; Clear to auscultation bilaterally Breast:  No dominant palpable mass, retraction, or nipple discharge Cardiovascular: Regular rate and rhythm Abdomen:  Soft, non tender, no hepatosplenomegaly Pelvic:  External genitalia is normal in appearance, no lesions.  The vagina is pale with loss of moisture and rugae. Urethra has no lesions or masses. The cervix is smooth, pap with HR HPV genotyping performed. Uterus is felt to be normal size, shape, and contour.  No adnexal masses or tenderness noted.Bladder is non tender, no masses felt. Rectal: Good sphincter tone, no polyps, or hemorrhoids felt.  Hemoccult negative. Extremities/musculoskeletal:  No swelling or varicosities noted, no clubbing or cyanosis Psych:  No mood changes, alert and cooperative,seems  happy AA is 2    12/10/2021    2:27 PM 10/29/2021    9:48 AM 07/09/2021    9:23 AM  Depression screen PHQ 2/9  Decreased Interest 0 0 0  Down, Depressed, Hopeless 0 0 0  PHQ - 2 Score 0 0 0  Altered sleeping 0    Tired, decreased energy 0    Change in appetite 0    Feeling bad or failure about yourself  0    Trouble concentrating 0    Moving slowly or fidgety/restless 0    Suicidal thoughts 0    PHQ-9 Score 0         12/10/2021    2:27 PM 02/26/2021    5:09 PM 01/12/2020    8:27 AM 08/06/2017    8:48 AM  GAD 7 : Generalized Anxiety Score  Nervous, Anxious, on Edge 0 3 0 1  Control/stop worrying 0 3 0 1  Worry too much - different things 0  0 0  Trouble relaxing 0  1 2  Restless 0  1 2  Easily annoyed or irritable 0  0 1  Afraid - awful might happen 0  0 0  Total GAD 7 Score 0  2 7  Anxiety Difficulty    Not difficult at all      Upstream - 12/10/21 1426       Pregnancy Intention Screening   Does the patient want to become pregnant in the next year? No    Does the patient's partner want to become pregnant in the next year? No  Would the patient like to discuss contraceptive options today? No      Contraception Wrap Up   Current Method No Method - Other Reason   postmenopausal   End Method No Method - Other Reason   postmenopausal   Contraception Counseling Provided No              Examination chaperoned by Levy Pupa LPN  Impression and Plan: 1. Encounter for gynecological examination with Papanicolaou smear of cervix Pap sent Pap in 3 years if normal Physical with PCP Labs with PCP  Colonoscopy per GI Mammogram scheduled for 12/25/21  2. Encounter for screening fecal occult blood testing Hemoccult was negative   3. Vaginal dryness, menopausal Discussed vaginal estrogen and she wants to try, aware of risk and benefits Discussed with Dr Elonda Husky and is good with her using vaginal estrogen Meds ordered this encounter  Medications   conjugated estrogens  (PREMARIN) vaginal cream    Sig: Use 0.5 gm in vagina nightly for 2 weeks then 2-3 x weekly    Dispense:  42.5 g    Refill:  1    Order Specific Question:   Supervising Provider    Answer:   EURE, LUTHER H [[id number]]      4. Dyspareunia, female Will try vaginal estrogen Try to increase frequency of sex Use good lubricate like astroglide

## 2021-12-13 ENCOUNTER — Other Ambulatory Visit: Payer: Self-pay | Admitting: Adult Health

## 2021-12-13 LAB — CYTOLOGY - PAP
Comment: NEGATIVE
Diagnosis: NEGATIVE
High risk HPV: NEGATIVE

## 2021-12-13 MED ORDER — FLUCONAZOLE 150 MG PO TABS: ORAL_TABLET | ORAL | 1 refills | Status: DC

## 2021-12-18 ENCOUNTER — Other Ambulatory Visit (HOSPITAL_COMMUNITY): Payer: Self-pay

## 2021-12-25 ENCOUNTER — Ambulatory Visit: Payer: 59

## 2021-12-27 ENCOUNTER — Ambulatory Visit: Payer: 59

## 2022-01-15 ENCOUNTER — Other Ambulatory Visit (HOSPITAL_COMMUNITY): Payer: Self-pay

## 2022-02-03 ENCOUNTER — Telehealth: Payer: Self-pay | Admitting: Family Medicine

## 2022-02-03 ENCOUNTER — Other Ambulatory Visit: Payer: Self-pay | Admitting: Adult Health

## 2022-02-03 ENCOUNTER — Encounter: Payer: Self-pay | Admitting: Family Medicine

## 2022-02-03 NOTE — Telephone Encounter (Signed)
Please advise 

## 2022-02-03 NOTE — Telephone Encounter (Signed)
Patient needs provider to  send to [facility] a prescription for [facility] .50 mg and that will need prior authorization also according to [facility].  Pt would like a call back.

## 2022-02-04 MED ORDER — SEMAGLUTIDE-WEIGHT MANAGEMENT 0.5 MG/0.5ML ~~LOC~~ SOAJ: 0.5000 mg | SUBCUTANEOUS | 1 refills | Status: DC

## 2022-02-04 NOTE — Telephone Encounter (Signed)
Called pt to let her know rx has been sent no answer left vm

## 2022-02-04 NOTE — Telephone Encounter (Signed)
Script is sent to  [facility] as requested , willneed follow up as in pA

## 2022-02-07 NOTE — Telephone Encounter (Signed)
Patient calling back and another certain medication Saxenda 3 mg to replace Wegovy. Since pharmacy can not get [facility] in yet.  Patient asked if can please sent to  Pharmacy: [facility] [facility]   Please contact patient back [phone].

## 2022-02-07 NOTE — Telephone Encounter (Signed)
Spoke with pt haven't received anything on this rx set 02/04/22 to [facility]. Pt will contact pharmacy and see that they received

## 2022-02-07 NOTE — Telephone Encounter (Signed)
Patient returning call.

## 2022-02-07 NOTE — Telephone Encounter (Signed)
Please advise 

## 2022-02-11 ENCOUNTER — Other Ambulatory Visit (HOSPITAL_COMMUNITY): Payer: Self-pay

## 2022-02-11 ENCOUNTER — Encounter: Payer: Self-pay | Admitting: Family Medicine

## 2022-02-11 MED ORDER — SAXENDA 18 MG/3ML ~~LOC~~ SOPN
3.0000 mg | PEN_INJECTOR | Freq: Every day | SUBCUTANEOUS | 1 refills | Status: DC
  Filled 2022-02-11 – 2022-02-14 (×2): qty 15, 30d supply, fill #0

## 2022-02-11 NOTE — Addendum Note (Signed)
Addended by: Fayrene Helper on: 02/11/2022 09:13 AM   Modules accepted: Orders

## 2022-02-14 ENCOUNTER — Other Ambulatory Visit (HOSPITAL_COMMUNITY): Payer: Self-pay

## 2022-02-14 NOTE — Telephone Encounter (Signed)
Pt is calling back in regards to pa for this?

## 2022-02-14 NOTE — Telephone Encounter (Signed)
Pa submitted on Cover my meds

## 2022-02-18 ENCOUNTER — Ambulatory Visit
Admission: RE | Admit: 2022-02-18 | Discharge: 2022-02-18 | Disposition: A | Discharge status: Home / Self Care | Payer: No Typology Code available for payment source | Source: Ambulatory Visit | Attending: Family Medicine | Admitting: Family Medicine

## 2022-02-18 DIAGNOSIS — Z1231 Encounter for screening mammogram for malignant neoplasm of breast: Secondary | ICD-10-CM

## 2022-02-19 ENCOUNTER — Other Ambulatory Visit (HOSPITAL_COMMUNITY): Payer: Self-pay

## 2022-02-19 NOTE — Telephone Encounter (Signed)
Patient called to check status of the prior authorization.

## 2022-02-21 ENCOUNTER — Telehealth: Payer: Self-pay | Admitting: Family Medicine

## 2022-02-21 ENCOUNTER — Other Ambulatory Visit (HOSPITAL_COMMUNITY): Payer: Self-pay

## 2022-02-21 ENCOUNTER — Other Ambulatory Visit: Payer: Self-pay

## 2022-02-21 NOTE — Telephone Encounter (Signed)
From 02/20/22 to 08/22/22

## 2022-02-22 LAB — CMP14+EGFR
ALT: 13 IU/L (ref 0–32)
AST: 14 IU/L (ref 0–40)
Albumin/Globulin Ratio: 1.6 (ref 1.2–2.2)
Albumin: 4.6 g/dL (ref 3.8–4.9)
Alkaline Phosphatase: 127 IU/L — ABNORMAL HIGH (ref 44–121)
BUN/Creatinine Ratio: 15 (ref 9–23)
BUN: 12 mg/dL (ref 6–24)
Bilirubin Total: 0.6 mg/dL (ref 0.0–1.2)
CO2: 23 mmol/L (ref 20–29)
Calcium: 9.8 mg/dL (ref 8.7–10.2)
Chloride: 99 mmol/L (ref 96–106)
Creatinine, Ser: 0.8 mg/dL (ref 0.57–1.00)
Globulin, Total: 2.8 g/dL (ref 1.5–4.5)
Glucose: 152 mg/dL — ABNORMAL HIGH (ref 70–99)
Potassium: 4.7 mmol/L (ref 3.5–5.2)
Sodium: 137 mmol/L (ref 134–144)
Total Protein: 7.4 g/dL (ref 6.0–8.5)
eGFR: 86 mL/min/{1.73_m2} (ref >59)

## 2022-02-22 LAB — LIPID PANEL
Chol/HDL Ratio: 2.9 ratio (ref 0.0–4.4)
Cholesterol, Total: 151 mg/dL (ref 100–199)
HDL: 52 mg/dL (ref >39)
LDL Chol Calc (NIH): 78 mg/dL (ref 0–99)
Triglycerides: 117 mg/dL (ref 0–149)
VLDL Cholesterol Cal: 21 mg/dL (ref 5–40)

## 2022-02-22 LAB — HEMOGLOBIN A1C
Est. average glucose Bld gHb Est-mCnc: 134 mg/dL
Hgb A1c MFr Bld: 6.3 % — ABNORMAL HIGH (ref 4.8–5.6)

## 2022-02-25 ENCOUNTER — Ambulatory Visit: Payer: No Typology Code available for payment source | Admitting: Family Medicine

## 2022-02-25 ENCOUNTER — Other Ambulatory Visit: Payer: Self-pay

## 2022-02-25 ENCOUNTER — Other Ambulatory Visit (HOSPITAL_COMMUNITY): Payer: Self-pay

## 2022-02-25 ENCOUNTER — Encounter: Payer: Self-pay | Admitting: Family Medicine

## 2022-02-25 VITALS — BP 122/82 | HR 87 | Ht 61.0 in | Wt 163.1 lb

## 2022-02-25 DIAGNOSIS — R748 Abnormal levels of other serum enzymes: Secondary | ICD-10-CM

## 2022-02-25 DIAGNOSIS — R7989 Other specified abnormal findings of blood chemistry: Secondary | ICD-10-CM

## 2022-02-25 DIAGNOSIS — E663 Overweight: Secondary | ICD-10-CM

## 2022-02-25 DIAGNOSIS — G9332 Myalgic encephalomyelitis/chronic fatigue syndrome: Secondary | ICD-10-CM

## 2022-02-25 DIAGNOSIS — E785 Hyperlipidemia, unspecified: Secondary | ICD-10-CM

## 2022-02-25 DIAGNOSIS — J0100 Acute maxillary sinusitis, unspecified: Secondary | ICD-10-CM | POA: Diagnosis not present

## 2022-02-25 DIAGNOSIS — E1159 Type 2 diabetes mellitus with other circulatory complications: Secondary | ICD-10-CM | POA: Diagnosis not present

## 2022-02-25 DIAGNOSIS — J320 Chronic maxillary sinusitis: Secondary | ICD-10-CM | POA: Insufficient documentation

## 2022-02-25 DIAGNOSIS — I10 Essential (primary) hypertension: Secondary | ICD-10-CM | POA: Diagnosis not present

## 2022-02-25 MED ORDER — AMPHETAMINE-DEXTROAMPHET ER 30 MG PO CP24
30.0000 mg | ORAL_CAPSULE | ORAL | 0 refills | Status: DC
  Filled 2022-04-22: qty 90, 90d supply, fill #0

## 2022-02-25 MED ORDER — AMPHETAMINE-DEXTROAMPHETAMINE 10 MG PO TABS
10.0000 mg | ORAL_TABLET | Freq: Every day | ORAL | 0 refills | Status: DC
  Filled 2022-04-22: qty 90, 90d supply, fill #0

## 2022-02-25 MED ORDER — AZITHROMYCIN 250 MG PO TABS
ORAL_TABLET | ORAL | 0 refills | Status: AC
  Filled 2022-02-25 (×2): qty 6, 5d supply, fill #0

## 2022-02-25 NOTE — Assessment & Plan Note (Signed)
  Patient re-educated about  the importance of commitment to a  minimum of 150 minutes of exercise per week as able.  The importance of healthy food choices with portion control discussed, as well as eating regularly and within a 12 hour window most days. The need to choose "clean , green" food 50 to 75% of the time is discussed, as well as to make water the primary drink and set a goal of 64 ounces water daily.       02/25/2022    8:28 AM 12/10/2021    2:21 PM 10/29/2021    9:47 AM  Weight /BMI  Weight 163 lb 1.9 oz 160 lb 8 oz 159 lb  Height '5\' 1"'$  (1.549 m) 5' (1.524 m) '5\' 1"'$  (1.549 m)  BMI 30.82 kg/m2 31.35 kg/m2 30.04 kg/m2

## 2022-02-25 NOTE — Progress Notes (Signed)
Carmen Mcintyre     MRN: [id number]      DOB: 25-Sep-1965   HPI Carmen Mcintyre is here for follow up and re-evaluation of chronic medical conditions, medication management and review of any available recent lab and radiology data.  Preventive health is updated, specifically  Cancer screening and Immunization.   The PT denies any adverse reactions to current medications since the last visit.  2 weeks ago she had head and chest congestion starting 1205, her symptoms have improved but she still c/o grey green post nasal drainage and maxillary and ethmoid pressure, Notes persistently elevated AlkPhos and though hesitant to get [place] and GI consult will go through with this option as this has been occurring along with abn LFT sporadically for more than year  Question about saxenda ,are answered , specifically if it will lower blood sugar and I explained that it will  ROS Denies recent chills. Denies chest congestion, productive cough or wheezing. Denies chest pains, palpitations and leg swelling Denies abdominal pain, nausea, vomiting,diarrhea or constipation.   Denies dysuria, frequency, hesitancy or incontinence. Denies joint pain, swelling and limitation in mobility. Denies headaches, seizures, numbness, or tingling. Denies depression, anxiety or insomnia. Denies skin break down or rash.   PE  BP 122/82 (BP Location: Left Arm, Patient Position: Sitting, Cuff Size: Normal)   Pulse 87   Ht _0  (1.549 m)   Wt 163 lb 1.9 oz (74 kg)   LMP 08/22/2016   SpO2 97%   BMI 30.82 kg/m   Patient alert and oriented and in no cardiopulmonary distress.  HEENT: No facial asymmetry, EOMI,     Neck supple .Maxillary sinus tenderness  Chest: Clear to auscultation bilaterally.  CVS: S1, S2 no murmurs, no S3.Regular rate.  ABD: Soft non tender.   Ext: No edema  MS: Adequate ROM spine, shoulders, hips and knees.  Skin: Intact, no ulcerations or rash noted.  Psych: Good eye contact, normal affect.  Memory intact not anxious or depressed appearing.  CNS: CN 2-12 intact, power,  normal throughout.no focal deficits noted.   Assessment & Plan  Maxillary sinusitis Z pack prescribed  HTN (hypertension) Controlled, no change in medication DASH diet and commitment to daily physical activity for a minimum of 30 minutes discussed and encouraged, as a part of hypertension management. The importance of attaining a healthy weight is also discussed.     02/25/2022    8:28 AM 12/10/2021    2:21 PM 10/29/2021    9:47 AM 07/09/2021    9:21 AM 05/28/2021   10:37 AM 05/01/2021   10:46 AM 02/26/2021    4:47 PM  BP/Weight  Systolic BP 154 008 676 195 093 267 124  Diastolic BP 82 82 78 82 85 82 76  Wt. (Lbs) 163.12 160.5 159 164 165.04 161.4   BMI 30.82 kg/m2 31.35 kg/m2 30.04 kg/m2 30.99 kg/m2 31.18 kg/m2 30.5 kg/m2        Type 2 diabetes mellitus with vascular disease ([place]) Carmen Mcintyre is reminded of the importance of commitment to daily physical activity for 30 minutes or more, as able and the need to limit carbohydrate intake to 30 to 60 grams per meal to help with blood sugar control.  St  The need to take medication as prescribed, test blood sugar as directed, and to call between visits if there is a concern that blood sugar is uncontrolled is also discussed.   Carmen Mcintyre is reminded of the importance of daily foot exam,  annual eye examination, and good blood sugar, blood pressure and cholesterol control.     Latest Ref Rng & Units 02/21/2022    8:37 AM 10/28/2021    2:09 PM 07/08/2021    2:32 PM 02/22/2021   11:10 AM 11/20/2020    8:43 AM  Diabetic Labs  HbA1c 4.8 - 5.6 % 6.3  5.9  6.4  5.6  6.7   Chol 100 - 199 mg/dL 151   169   153   HDL >39 mg/dL 52   51   48   Calc LDL 0 - 99 mg/dL 78   88   81   Triglycerides 0 - 149 mg/dL 117   174   138   Creatinine 0.57 - 1.00 mg/dL 0.80  0.78  0.91  0.70  0.80       02/25/2022    8:28 AM 12/10/2021    2:21 PM 10/29/2021    9:47 AM 07/09/2021     9:21 AM 05/28/2021   10:37 AM 05/01/2021   10:46 AM 02/26/2021    4:47 PM  BP/Weight  Systolic BP 561 537 943 276 147 092 957  Diastolic BP 82 82 78 82 85 82 76  Wt. (Lbs) 163.12 160.5 159 164 165.04 161.4   BMI 30.82 kg/m2 31.35 kg/m2 30.04 kg/m2 30.99 kg/m2 31.18 kg/m2 30.5 kg/m2       Latest Ref Rng & Units 02/25/2022    8:20 AM 03/19/2021   12:00 AM  Foot/eye exam completion dates  Eye Exam No Retinopathy  No Retinopathy      Foot Form Completion  Done      This result is from an external source.      Blood sugar increased   Overweight (BMI 25.0-29.9)  Patient re-educated about  the importance of commitment to a  minimum of 150 minutes of exercise per week as able.  The importance of healthy food choices with portion control discussed, as well as eating regularly and within a 12 hour window most days. The need to choose "clean , green" food 50 to 75% of the time is discussed, as well as to make water the primary drink and set a goal of 64 ounces water daily.       02/25/2022    8:28 AM 12/10/2021    2:21 PM 10/29/2021    9:47 AM  Weight /BMI  Weight 163 lb 1.9 oz 160 lb 8 oz 159 lb  Height _0  (1.549 m) 5' (1.524 m) _1  (1.549 m)  BMI 30.82 kg/m2 31.35 kg/m2 30.04 kg/m2      Hyperlipidemia LDL goal <70 Hyperlipidemia:Low fat diet discussed and encouraged.   Lipid Panel  Lab Results  Component Value Date   CHOL 151 02/21/2022   HDL 52 02/21/2022   LDLCALC 78 02/21/2022   TRIG 117 02/21/2022   CHOLHDL 2.9 02/21/2022     Controlled, no change in medication   Elevated alkaline phosphatase level Recurrent elevated alk phos and intermittent elevated LFT, US liver and GI eval  Chronic fatigue disorder Continue daily adderall, rx sent for 90 day supply

## 2022-02-25 NOTE — Assessment & Plan Note (Signed)
Recurrent elevated alk phos and intermittent elevated LFT, US liver and GI eval

## 2022-02-25 NOTE — Assessment & Plan Note (Signed)
Continue daily adderall, rx sent for 90 day supply

## 2022-02-25 NOTE — Assessment & Plan Note (Signed)
Controlled, no change in medication DASH diet and commitment to daily physical activity for a minimum of 30 minutes discussed and encouraged, as a part of hypertension management. The importance of attaining a healthy weight is also discussed.     02/25/2022    8:28 AM 12/10/2021    2:21 PM 10/29/2021    9:47 AM 07/09/2021    9:21 AM 05/28/2021   10:37 AM 05/01/2021   10:46 AM 02/26/2021    4:47 PM  BP/Weight  Systolic BP 160 737 106 269 485 462 703  Diastolic BP 82 82 78 82 85 82 76  Wt. (Lbs) 163.12 160.5 159 164 165.04 161.4   BMI 30.82 kg/m2 31.35 kg/m2 30.04 kg/m2 30.99 kg/m2 31.18 kg/m2 30.5 kg/m2

## 2022-02-25 NOTE — Patient Instructions (Addendum)
Follow-up in 13 weeks, call if you need me sooner.  Please test fasting blood sugars and note that the goal for good control is a range of 80-1 20.  Nonfasting HbA1c Chem-7 and EGFR in 12 weeks.  You are referred for an ultrasound of your liver and also for the gastroenterologist to review your persistently elevated alkaline phosphatase level.  Z pack is prescribed for sinus infection  You have a normal foot exam today.  Microalbumin in office today.  Please come for the flu vaccine when you start feeling better, and a COVID vaccine is also recommended.  It is important that you exercise regularly at least 30 minutes 5 times a week. If you develop chest pain, have severe difficulty breathing, or feel very tired, stop exercising immediately and seek medical attention  Thanks for choosing [facility], we consider it a privelige to serve you.

## 2022-02-25 NOTE — Assessment & Plan Note (Signed)
Z pack prescribed 

## 2022-02-25 NOTE — Assessment & Plan Note (Signed)
Hyperlipidemia:Low fat diet discussed and encouraged.   Lipid Panel  Lab Results  Component Value Date   CHOL 151 02/21/2022   HDL 52 02/21/2022   LDLCALC 78 02/21/2022   TRIG 117 02/21/2022   CHOLHDL 2.9 02/21/2022     Controlled, no change in medication

## 2022-02-25 NOTE — Assessment & Plan Note (Addendum)
Carmen Mcintyre is reminded of the importance of commitment to daily physical activity for 30 minutes or more, as able and the need to limit carbohydrate intake to 30 to 60 grams per meal to help with blood sugar control.  St  The need to take medication as prescribed, test blood sugar as directed, and to call between visits if there is a concern that blood sugar is uncontrolled is also discussed.   Carmen Mcintyre is reminded of the importance of daily foot exam, annual eye examination, and good blood sugar, blood pressure and cholesterol control.     Latest Ref Rng & Units 02/21/2022    8:37 AM 10/28/2021    2:09 PM 07/08/2021    2:32 PM 02/22/2021   11:10 AM 11/20/2020    8:43 AM  Diabetic Labs  HbA1c 4.8 - 5.6 % 6.3  5.9  6.4  5.6  6.7   Chol 100 - 199 mg/dL 151   169   153   HDL >39 mg/dL 52   51   48   Calc LDL 0 - 99 mg/dL 78   88   81   Triglycerides 0 - 149 mg/dL 117   174   138   Creatinine 0.57 - 1.00 mg/dL 0.80  0.78  0.91  0.70  0.80       02/25/2022    8:28 AM 12/10/2021    2:21 PM 10/29/2021    9:47 AM 07/09/2021    9:21 AM 05/28/2021   10:37 AM 05/01/2021   10:46 AM 02/26/2021    4:47 PM  BP/Weight  Systolic BP 103 159 458 592 924 462 863  Diastolic BP 82 82 78 82 85 82 76  Wt. (Lbs) 163.12 160.5 159 164 165.04 161.4   BMI 30.82 kg/m2 31.35 kg/m2 30.04 kg/m2 30.99 kg/m2 31.18 kg/m2 30.5 kg/m2       Latest Ref Rng & Units 02/25/2022    8:20 AM 03/19/2021   12:00 AM  Foot/eye exam completion dates  Eye Exam No Retinopathy  No Retinopathy      Foot Form Completion  Done      This result is from an external source.      Blood sugar increased

## 2022-02-26 ENCOUNTER — Other Ambulatory Visit (HOSPITAL_COMMUNITY): Payer: Self-pay

## 2022-02-27 ENCOUNTER — Other Ambulatory Visit: Payer: Self-pay

## 2022-02-27 ENCOUNTER — Other Ambulatory Visit (HOSPITAL_COMMUNITY): Payer: Self-pay

## 2022-02-27 ENCOUNTER — Telehealth: Payer: Self-pay | Admitting: Family Medicine

## 2022-02-27 DIAGNOSIS — E1159 Type 2 diabetes mellitus with other circulatory complications: Secondary | ICD-10-CM

## 2022-02-27 LAB — MICROALBUMIN / CREATININE URINE RATIO
Creatinine, Urine: 230.1 mg/dL
Microalb/Creat Ratio: 7 mg/g creat (ref 0–29)
Microalbumin, Urine: 15.7 ug/mL

## 2022-02-27 MED ORDER — BD PEN NEEDLE NANO U/F 32G X 4 MM MISC
2 refills | Status: DC
  Filled 2022-02-27: qty 100, 90d supply, fill #0

## 2022-02-27 NOTE — Telephone Encounter (Signed)
Patient needs needle TIPS for Saxenda injection.  [facility]

## 2022-02-27 NOTE — Telephone Encounter (Signed)
Pen needles sent

## 2022-03-07 ENCOUNTER — Encounter (INDEPENDENT_AMBULATORY_CARE_PROVIDER_SITE_OTHER): Payer: Self-pay

## 2022-03-11 ENCOUNTER — Ambulatory Visit (HOSPITAL_COMMUNITY): Payer: No Typology Code available for payment source

## 2022-04-07 LAB — HM DIABETES EYE EXAM

## 2022-04-22 ENCOUNTER — Other Ambulatory Visit (HOSPITAL_COMMUNITY): Payer: Self-pay

## 2022-04-22 ENCOUNTER — Encounter (HOSPITAL_COMMUNITY): Payer: Self-pay

## 2022-04-23 ENCOUNTER — Other Ambulatory Visit (HOSPITAL_COMMUNITY): Payer: Self-pay

## 2022-04-23 ENCOUNTER — Telehealth: Payer: Self-pay | Admitting: Family Medicine

## 2022-04-23 NOTE — Telephone Encounter (Signed)
Patient called in regard to ultrasound orders on pancreas.  Patient is wanting orders sent to [facility] in [place].  Fax# [phone]

## 2022-04-23 NOTE — Telephone Encounter (Signed)
Order faxed to [facility] per patient request.

## 2022-04-24 ENCOUNTER — Other Ambulatory Visit (HOSPITAL_COMMUNITY): Payer: Self-pay

## 2022-04-24 ENCOUNTER — Other Ambulatory Visit: Payer: Self-pay

## 2022-04-24 ENCOUNTER — Encounter: Payer: Self-pay | Admitting: Family Medicine

## 2022-04-25 ENCOUNTER — Other Ambulatory Visit: Payer: Self-pay | Admitting: Family Medicine

## 2022-04-25 MED ORDER — AMPHETAMINE-DEXTROAMPHET ER 30 MG PO CP24: 30.0000 mg | ORAL_CAPSULE | ORAL | 0 refills | Status: DC

## 2022-04-25 MED ORDER — AMPHETAMINE-DEXTROAMPHETAMINE 10 MG PO TABS: ORAL_TABLET | ORAL | 0 refills | Status: DC

## 2022-05-09 ENCOUNTER — Encounter: Payer: Self-pay | Admitting: Family Medicine

## 2022-05-26 IMAGING — MG MM DIGITAL SCREENING BILAT W/ TOMO AND CAD
8 series · 8 of 24 positions shown · non-contrast
Comparison: Previous exam(s).

CLINICAL DATA: Screening.

EXAM:
DIGITAL SCREENING BILATERAL MAMMOGRAM WITH TOMOSYNTHESIS AND CAD
TECHNIQUE: Bilateral screening digital craniocaudal and mediolateral oblique
mammograms were obtained. Bilateral screening digital breast
tomosynthesis was performed. The images were evaluated with
computer-aided detection.

[R CC synth-2D]
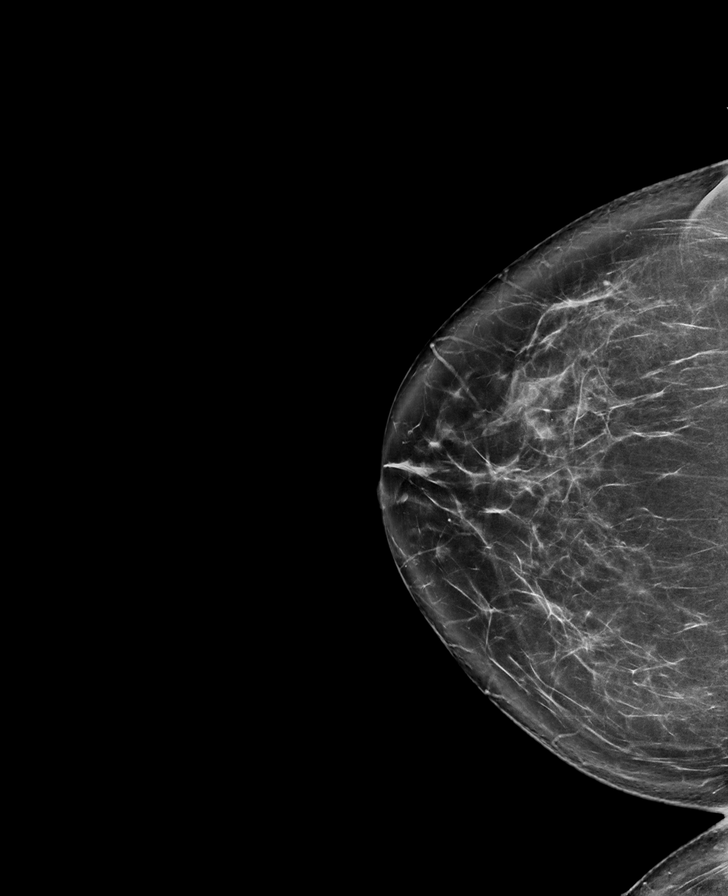

[L CC synth-2D]
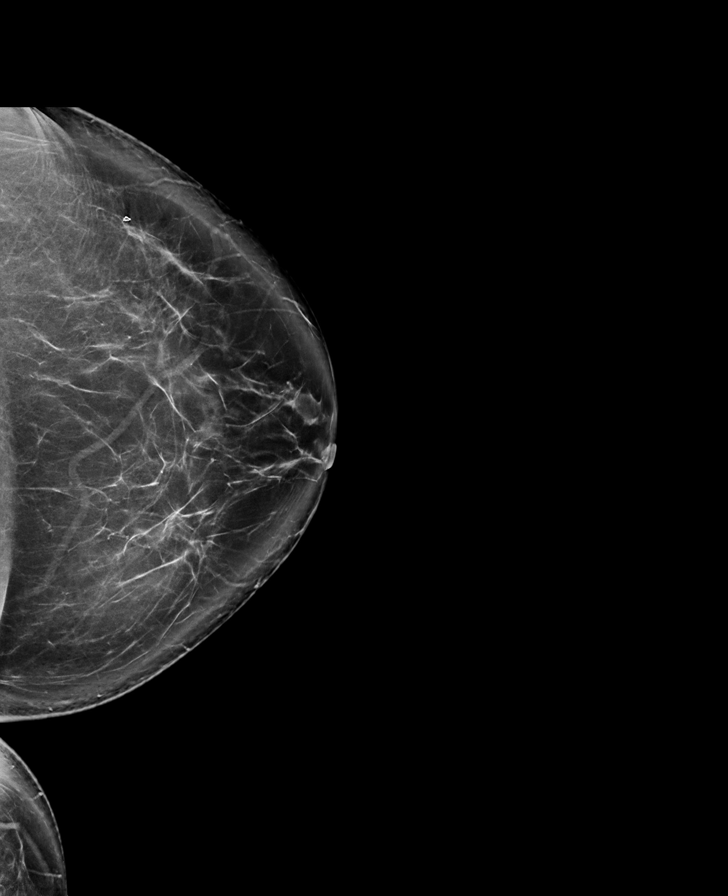

[R MLO synth-2D]
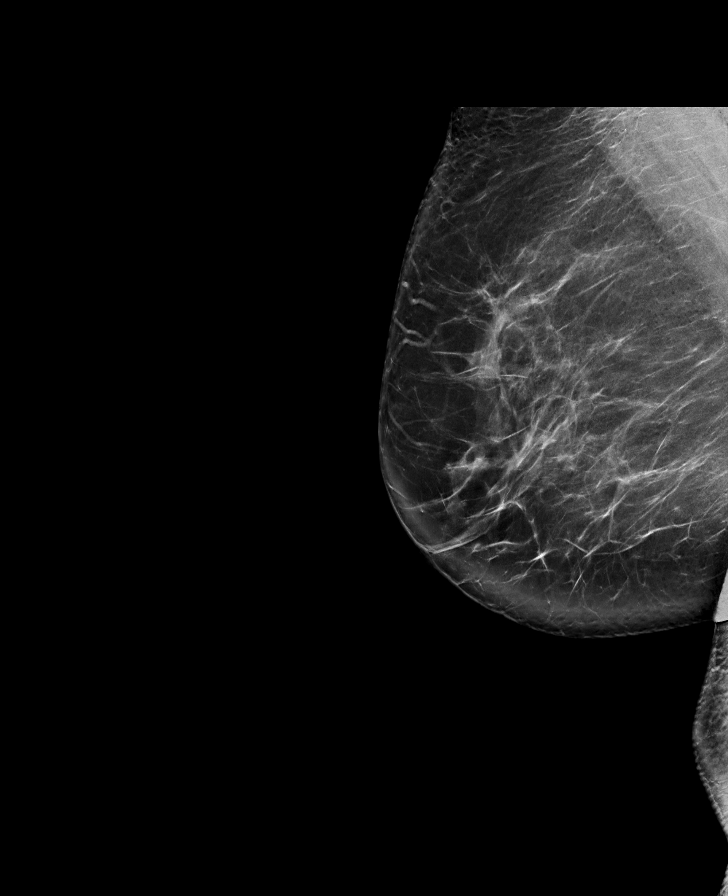

[L MLO synth-2D]
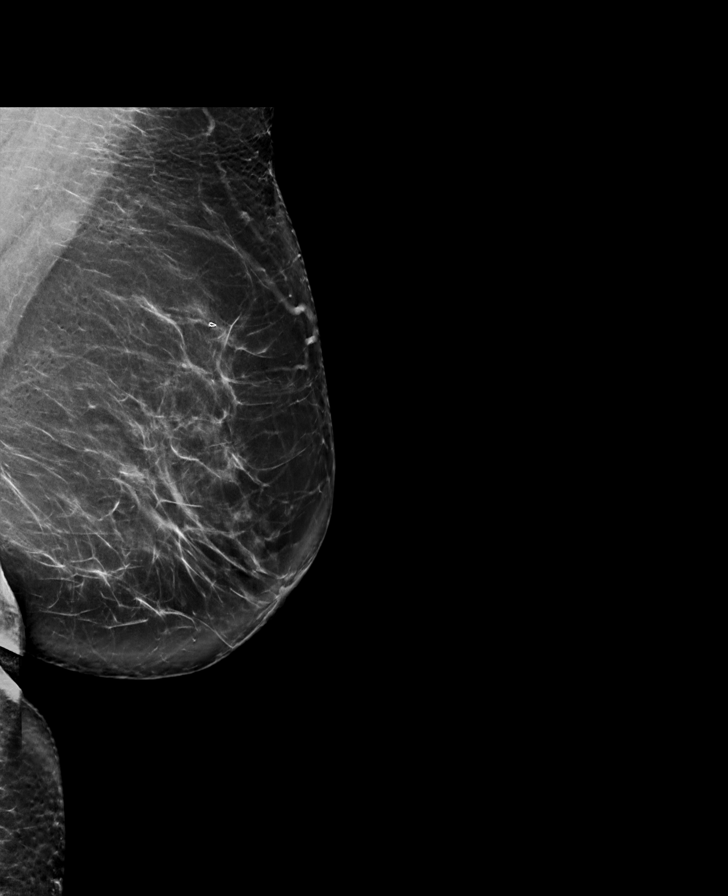

[L MLO tomo · tomo slice 48/95.0]
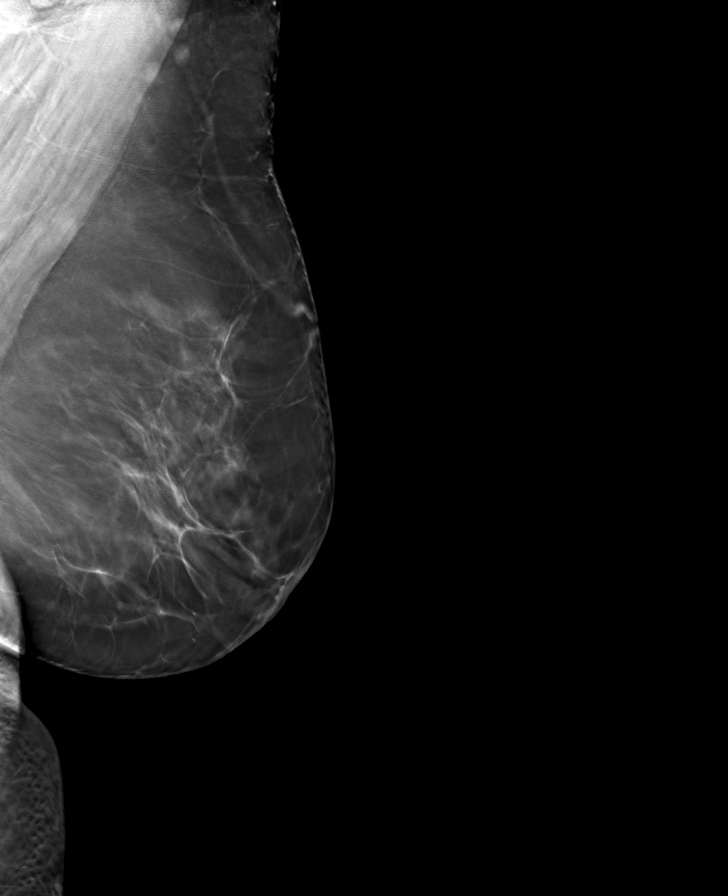

[R CC tomo · tomo slice 43/85.0]
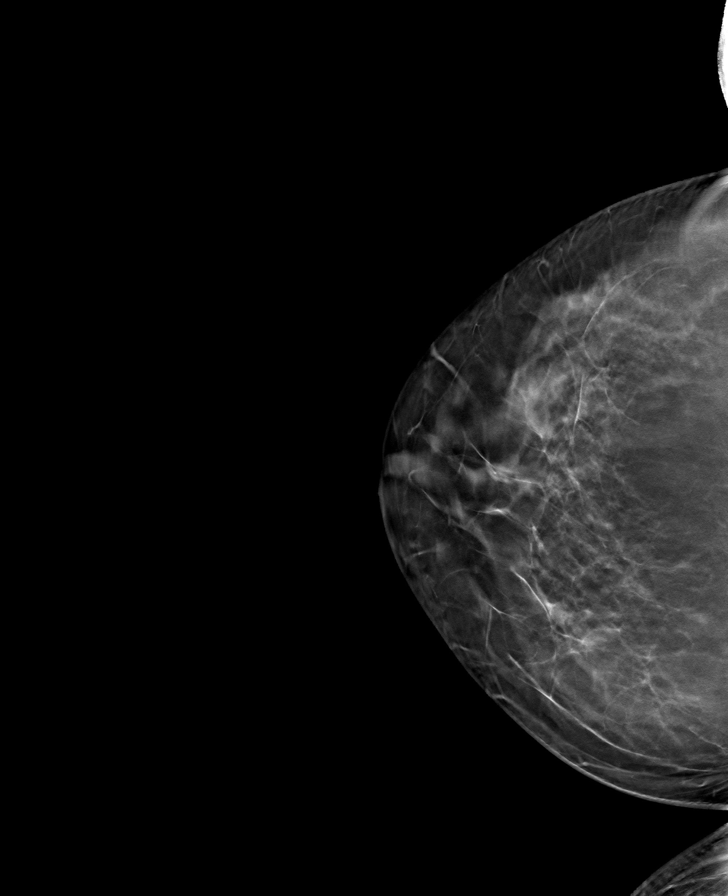

[R MLO tomo · tomo slice 49/97.0]
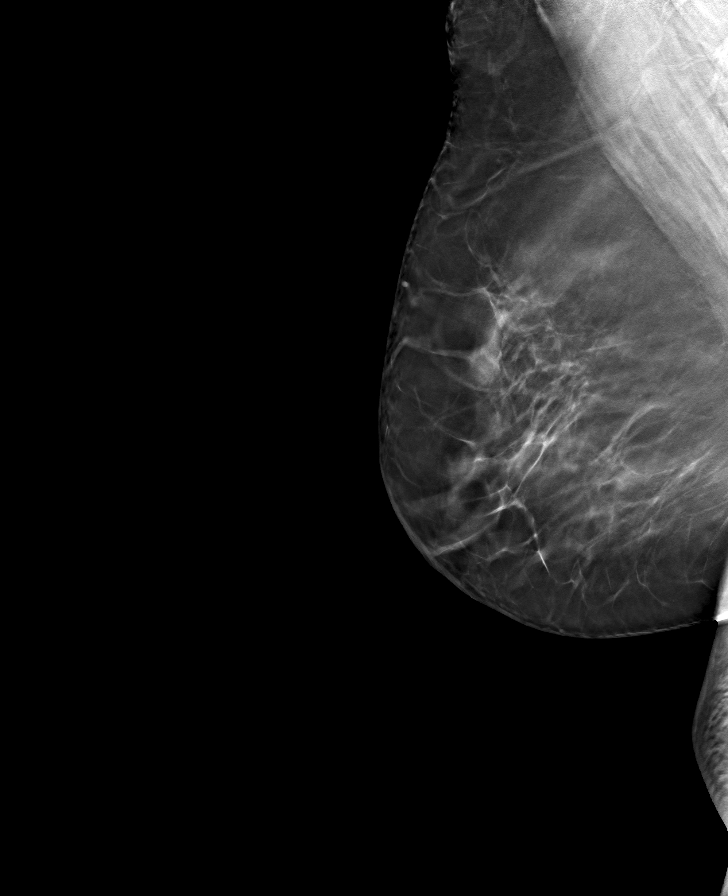

[L CC tomo · tomo slice 49/96.0]
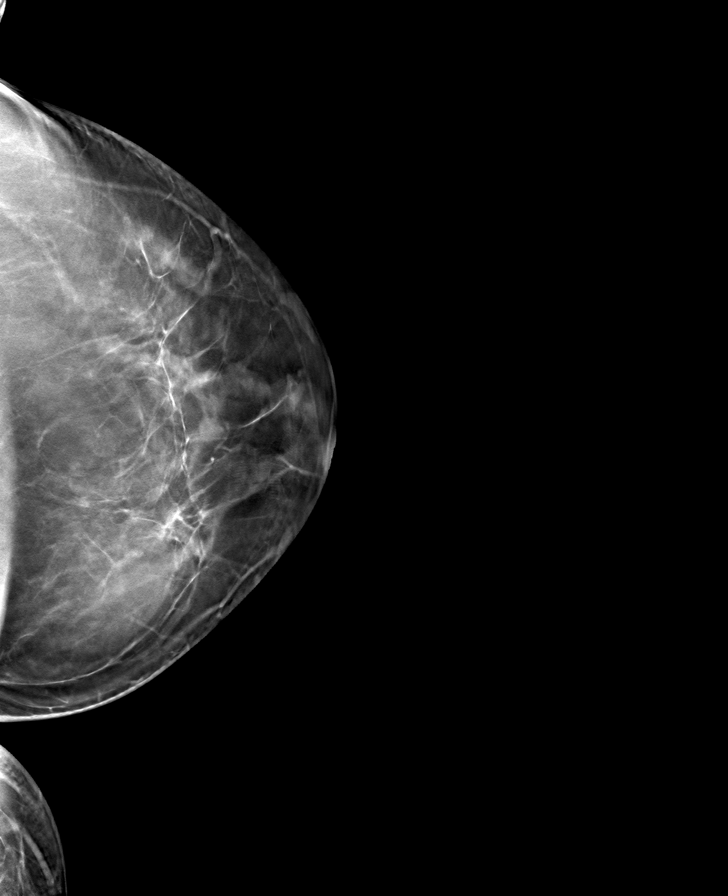

[8 of 24 positions shown; findings below may reference images not displayed]

ACR Breast Density Category b: There are scattered areas of
fibroglandular density.
FINDINGS: There are no findings suspicious for malignancy.
IMPRESSION: No mammographic evidence of malignancy. A result letter of this
screening mammogram will be mailed directly to the patient.

RECOMMENDATION:
Screening mammogram in one year. (Code:51-O-LD2)

BI-RADS CATEGORY  1: Negative.

## 2022-05-27 ENCOUNTER — Other Ambulatory Visit (HOSPITAL_COMMUNITY): Payer: Self-pay

## 2022-05-27 ENCOUNTER — Ambulatory Visit (INDEPENDENT_AMBULATORY_CARE_PROVIDER_SITE_OTHER): Payer: No Typology Code available for payment source | Admitting: Family Medicine

## 2022-05-27 ENCOUNTER — Encounter: Payer: Self-pay | Admitting: Family Medicine

## 2022-05-27 ENCOUNTER — Other Ambulatory Visit: Payer: Self-pay

## 2022-05-27 VITALS — BP 108/72 | HR 87 | Resp 16 | Ht 61.0 in | Wt 163.0 lb

## 2022-05-27 DIAGNOSIS — R748 Abnormal levels of other serum enzymes: Secondary | ICD-10-CM

## 2022-05-27 DIAGNOSIS — E1159 Type 2 diabetes mellitus with other circulatory complications: Secondary | ICD-10-CM | POA: Diagnosis not present

## 2022-05-27 DIAGNOSIS — I10 Essential (primary) hypertension: Secondary | ICD-10-CM | POA: Diagnosis not present

## 2022-05-27 DIAGNOSIS — E785 Hyperlipidemia, unspecified: Secondary | ICD-10-CM

## 2022-05-27 DIAGNOSIS — Z1211 Encounter for screening for malignant neoplasm of colon: Secondary | ICD-10-CM | POA: Diagnosis not present

## 2022-05-27 DIAGNOSIS — G9332 Myalgic encephalomyelitis/chronic fatigue syndrome: Secondary | ICD-10-CM

## 2022-05-27 DIAGNOSIS — E559 Vitamin D deficiency, unspecified: Secondary | ICD-10-CM

## 2022-05-27 DIAGNOSIS — E663 Overweight: Secondary | ICD-10-CM

## 2022-05-27 DIAGNOSIS — E8881 Metabolic syndrome: Secondary | ICD-10-CM

## 2022-05-27 LAB — BMP8+EGFR
BUN/Creatinine Ratio: 18 (ref 9–23)
BUN: 14 mg/dL (ref 6–24)
CO2: 24 mmol/L (ref 20–29)
Calcium: 9.6 mg/dL (ref 8.7–10.2)
Chloride: 104 mmol/L (ref 96–106)
Creatinine, Ser: 0.76 mg/dL (ref 0.57–1.00)
Glucose: 84 mg/dL (ref 70–99)
Potassium: 4.7 mmol/L (ref 3.5–5.2)
Sodium: 141 mmol/L (ref 134–144)
eGFR: 91 mL/min/{1.73_m2} (ref >59)

## 2022-05-27 LAB — HEMOGLOBIN A1C
Est. average glucose Bld gHb Est-mCnc: 114 mg/dL
Hgb A1c MFr Bld: 5.6 % (ref 4.8–5.6)

## 2022-05-27 MED ORDER — SEMAGLUTIDE-WEIGHT MANAGEMENT 0.25 MG/0.5ML ~~LOC~~ SOAJ
0.2500 mg | SUBCUTANEOUS | 0 refills | Status: DC
  Filled 2022-05-27 – 2022-05-31 (×2): qty 2, 28d supply, fill #0

## 2022-05-27 NOTE — Patient Instructions (Addendum)
F/U in 13 weeks, call if you  need me sooner  New is wegovy 2.5 mg weekly, sent to [facility], this is in place of saxenda  I will send transcopolamine and xanax to Mail order  Nurse please arrange cologuard test  I am looking out for chest and lung scan report and will decide on whether you need a chest scan separatey as we discussed   MRI brain later this year  Nurse please add  LFT, liver function test to current labs dx elevated alk phos  Fasting lipid, cmp and EGFr, HBA1C, TSH, cBC and vit D 3 to 5 days before net appointment  Thanks for choosing [facility], we consider it a privelige to serve you.

## 2022-05-28 LAB — HEPATIC FUNCTION PANEL
ALT: 14 IU/L (ref 0–32)
AST: 17 IU/L (ref 0–40)
Albumin: 4.5 g/dL (ref 3.8–4.9)
Alkaline Phosphatase: 116 IU/L (ref 44–121)
Bilirubin Total: 0.5 mg/dL (ref 0.0–1.2)
Bilirubin, Direct: 0.12 mg/dL (ref 0.00–0.40)
Total Protein: 7.1 g/dL (ref 6.0–8.5)

## 2022-05-28 LAB — SPECIMEN STATUS REPORT

## 2022-05-29 ENCOUNTER — Telehealth: Payer: Self-pay | Admitting: Family Medicine

## 2022-05-29 ENCOUNTER — Other Ambulatory Visit (HOSPITAL_COMMUNITY): Payer: Self-pay

## 2022-05-29 ENCOUNTER — Encounter: Payer: Self-pay | Admitting: Family Medicine

## 2022-05-29 MED ORDER — ALPRAZOLAM 0.25 MG PO TABS: 0.2500 mg | ORAL_TABLET | Freq: Three times a day (TID) | ORAL | 0 refills | Status: DC | PRN

## 2022-05-29 MED ORDER — SCOPOLAMINE 1 MG/3DAYS TD PT72: 1.0000 | MEDICATED_PATCH | TRANSDERMAL | 0 refills | Status: DC

## 2022-05-29 MED ORDER — AMPHETAMINE-DEXTROAMPHETAMINE 10 MG PO TABS: ORAL_TABLET | ORAL | 0 refills | Status: DC

## 2022-05-29 MED ORDER — AMPHETAMINE-DEXTROAMPHET ER 30 MG PO CP24: 30.0000 mg | ORAL_CAPSULE | ORAL | 0 refills | Status: DC

## 2022-05-29 NOTE — Assessment & Plan Note (Signed)
Hyperlipidemia:Low fat diet discussed and encouraged.   Lipid Panel  Lab Results  Component Value Date   CHOL 151 02/21/2022   HDL 52 02/21/2022   LDLCALC 78 02/21/2022   TRIG 117 02/21/2022   CHOLHDL 2.9 02/21/2022

## 2022-05-29 NOTE — Assessment & Plan Note (Signed)
Improved Carmen Mcintyre is reminded of the importance of commitment to daily physical activity for 30 minutes or more, as able and the need to limit carbohydrate intake to 30 to 60 grams per meal to help with blood sugar control.   The need to take medication as prescribed, test blood sugar as directed, and to call between visits if there is a concern that blood sugar is uncontrolled is also discussed.   Carmen Mcintyre is reminded of the importance of daily foot exam, annual eye examination, and good blood sugar, blood pressure and cholesterol control.     Latest Ref Rng & Units 05/26/2022   12:59 PM 02/25/2022    9:11 AM 02/21/2022    8:37 AM 10/28/2021    2:09 PM 07/08/2021    2:32 PM  Diabetic Labs  HbA1c 4.8 - 5.6 % 5.6   6.3  5.9  6.4   Micro/Creat Ratio 0 - 29 mg/g creat  7      Chol 100 - 199 mg/dL   151   169   HDL >39 mg/dL   52   51   Calc LDL 0 - 99 mg/dL   78   88   Triglycerides 0 - 149 mg/dL   117   174   Creatinine 0.57 - 1.00 mg/dL 0.76   0.80  0.78  0.91       05/27/2022    8:24 AM 02/25/2022    8:28 AM 12/10/2021    2:21 PM 10/29/2021    9:47 AM 07/09/2021    9:21 AM 05/28/2021   10:37 AM 05/01/2021   10:46 AM  BP/Weight  Systolic BP 123XX123 123XX123 [id number] 123XX123 [id number] 0000000  Diastolic BP 72 82 82 78 82 85 82  Wt. (Lbs) 163 163.12 160.5 159 164 165.04 161.4  BMI 30.8 kg/m2 30.82 kg/m2 31.35 kg/m2 30.04 kg/m2 30.99 kg/m2 31.18 kg/m2 30.5 kg/m2      Latest Ref Rng & Units 04/07/2022   12:00 AM 02/25/2022    8:20 AM  Foot/eye exam completion dates  Eye Exam No Retinopathy No Retinopathy       Foot Form Completion   Done     This result is from an external source.

## 2022-05-29 NOTE — Assessment & Plan Note (Signed)
Good response to aderall continue same

## 2022-05-29 NOTE — Assessment & Plan Note (Signed)
Rept level is normal, with normal [place] of RUQ will not need GI eval

## 2022-05-29 NOTE — Telephone Encounter (Signed)
Patient called said her pharmacy needs prior authorization on Wegovy 0.25 mg before Dr Moshe Cipro leaves end of week.  Pharmacy  [facility], [place] [address] Phone: [phone]  Fax: [phone]

## 2022-05-29 NOTE — Progress Notes (Signed)
Carmen Mcintyre     MRN: [id number]      DOB: 02/19/1966   HPI Carmen Mcintyre is here for follow up and re-evaluation of chronic medical conditions, medication management and review of any available recent lab and radiology data.  Preventive health is updated, specifically  Cancer screening and Immunization.    The PT denies any adverse reactions to current medications since the last visit.  Wants to try [facility] for weight loss again, practicing imp[roved lifestyle with no weight loss Has upcoming travel to [place], requests motion sickness medication and anxiety med  ROS Denies recent fever or chills. Denies sinus pressure, nasal congestion, ear pain or sore throat. Denies chest congestion, productive cough or wheezing. Denies chest pains, palpitations and leg swelling Denies abdominal pain, nausea, vomiting,diarrhea or constipation.   Denies dysuria, frequency, hesitancy or incontinence. Denies joint pain, swelling and limitation in mobility. Denies headaches, seizures, numbness, or tingling. Denies depression, anxiety or insomnia. Denies skin break down or rash.   PE  BP 108/72   Pulse 87   Resp 16   Ht 5\' 1"  (1.549 m)   Wt 163 lb (73.9 kg)   LMP 08/22/2016   SpO2 97%   BMI 30.80 kg/m   Patient alert and oriented and in no cardiopulmonary distress.  HEENT: No facial asymmetry, EOMI,     Neck supple .  Chest: Clear to auscultation bilaterally.  CVS: S1, S2 no murmurs, no S3.Regular rate.   Ext: No edema  MS: Adequate ROM spine, shoulders, hips and knees.  Skin: Intact, no ulcerations or rash noted.  Psych: Good eye contact, normal affect. Memory intact not anxious or depressed appearing.  CNS: CN 2-12 intact, power,  normal throughout.no focal deficits noted.   Assessment & Plan  HTN (hypertension) Controlled, no change in medication DASH diet and commitment to daily physical activity for a minimum of 30 minutes discussed and encouraged, as a part of  hypertension management. The importance of attaining a healthy weight is also discussed.     05/27/2022    8:24 AM 02/25/2022    8:28 AM 12/10/2021    2:21 PM 10/29/2021    9:47 AM 07/09/2021    9:21 AM 05/28/2021   10:37 AM 05/01/2021   10:46 AM  BP/Weight  Systolic BP 123XX123 123XX123 [id number] 123XX123 A999333 Q000111Q 0000000  Diastolic BP 72 82 82 78 82 85 82  Wt. (Lbs) 163 163.12 160.5 159 164 165.04 161.4  BMI 30.8 kg/m2 30.82 kg/m2 31.35 kg/m2 30.04 kg/m2 30.99 kg/m2 31.18 kg/m2 30.5 kg/m2       Type 2 diabetes mellitus with vascular disease ([place]) Improved Carmen Mcintyre is reminded of the importance of commitment to daily physical activity for 30 minutes or more, as able and the need to limit carbohydrate intake to 30 to 60 grams per meal to help with blood sugar control.   The need to take medication as prescribed, test blood sugar as directed, and to call between visits if there is a concern that blood sugar is uncontrolled is also discussed.   Carmen Mcintyre is reminded of the importance of daily foot exam, annual eye examination, and good blood sugar, blood pressure and cholesterol control.     Latest Ref Rng & Units 05/26/2022   12:59 PM 02/25/2022    9:11 AM 02/21/2022    8:37 AM 10/28/2021    2:09 PM 07/08/2021    2:32 PM  Diabetic Labs  HbA1c 4.8 - 5.6 % 5.6  6.3  5.9  6.4   Micro/Creat Ratio 0 - 29 mg/g creat  7      Chol 100 - 199 mg/dL   151   169   HDL >39 mg/dL   52   51   Calc LDL 0 - 99 mg/dL   78   88   Triglycerides 0 - 149 mg/dL   117   174   Creatinine 0.57 - 1.00 mg/dL 0.76   0.80  0.78  0.91       05/27/2022    8:24 AM 02/25/2022    8:28 AM 12/10/2021    2:21 PM 10/29/2021    9:47 AM 07/09/2021    9:21 AM 05/28/2021   10:37 AM 05/01/2021   10:46 AM  BP/Weight  Systolic BP 123XX123 123XX123 [id number] 123XX123 A999333 Q000111Q 0000000  Diastolic BP 72 82 82 78 82 85 82  Wt. (Lbs) 163 163.12 160.5 159 164 165.04 161.4  BMI 30.8 kg/m2 30.82 kg/m2 31.35 kg/m2 30.04 kg/m2 30.99 kg/m2 31.18 kg/m2 30.5 kg/m2      Latest Ref Rng  & Units 04/07/2022   12:00 AM 02/25/2022    8:20 AM  Foot/eye exam completion dates  Eye Exam No Retinopathy No Retinopathy       Foot Form Completion   Done     This result is from an external source.        Chronic fatigue disorder Good response to aderall continue same  Elevated alkaline phosphatase level Rept level is normal, with normal [place] of RUQ will not need GI eval  Hyperlipidemia LDL goal <70 Hyperlipidemia:Low fat diet discussed and encouraged.   Lipid Panel  Lab Results  Component Value Date   CHOL 151 02/21/2022   HDL 52 02/21/2022   LDLCALC 78 02/21/2022   TRIG 117 02/21/2022   CHOLHDL 2.9 02/21/2022       Overweight (BMI 25.0-29.9)  Patient re-educated about  the importance of commitment to a  minimum of 150 minutes of exercise per week as able.  The importance of healthy food choices with portion control discussed, as well as eating regularly and within a 12 hour window most days. The need to choose "clean , green" food 50 to 75% of the time is discussed, as well as to make water the primary drink and set a goal of 64 ounces water daily.       05/27/2022    8:24 AM 02/25/2022    8:28 AM 12/10/2021    2:21 PM  Weight /BMI  Weight 163 lb 163 lb 1.9 oz 160 lb 8 oz  Height 5\' 1"  (1.549 m) 5\' 1"  (1.549 m) 5' (1.524 m)  BMI 30.8 kg/m2 30.82 kg/m2 31.35 kg/m2    Wegovy is prescribed  Metabolic syndrome X The increased risk of cardiovascular disease associated with this diagnosis, and the need to consistently work on lifestyle to change this is discussed. Following  a  heart healthy diet ,commitment to 30 minutes of exercise at least 5 days per week, as well as control of blood sugar and cholesterol , and achieving a healthy weight are all the areas to be addressed .

## 2022-05-29 NOTE — Assessment & Plan Note (Signed)
  Patient re-educated about  the importance of commitment to a  minimum of 150 minutes of exercise per week as able.  The importance of healthy food choices with portion control discussed, as well as eating regularly and within a 12 hour window most days. The need to choose "clean , green" food 50 to 75% of the time is discussed, as well as to make water the primary drink and set a goal of 64 ounces water daily.       05/27/2022    8:24 AM 02/25/2022    8:28 AM 12/10/2021    2:21 PM  Weight /BMI  Weight 163 lb 163 lb 1.9 oz 160 lb 8 oz  Height 5\' 1"  (1.549 m) 5\' 1"  (1.549 m) 5' (1.524 m)  BMI 30.8 kg/m2 30.82 kg/m2 31.35 kg/m2    Mancel Parsons is prescribed

## 2022-05-29 NOTE — Assessment & Plan Note (Signed)
Controlled, no change in medication DASH diet and commitment to daily physical activity for a minimum of 30 minutes discussed and encouraged, as a part of hypertension management. The importance of attaining a healthy weight is also discussed.     05/27/2022    8:24 AM 02/25/2022    8:28 AM 12/10/2021    2:21 PM 10/29/2021    9:47 AM 07/09/2021    9:21 AM 05/28/2021   10:37 AM 05/01/2021   10:46 AM  BP/Weight  Systolic BP 123XX123 123XX123 [id number] 123XX123 A999333 Q000111Q 0000000  Diastolic BP 72 82 82 78 82 85 82  Wt. (Lbs) 163 163.12 160.5 159 164 165.04 161.4  BMI 30.8 kg/m2 30.82 kg/m2 31.35 kg/m2 30.04 kg/m2 30.99 kg/m2 31.18 kg/m2 30.5 kg/m2

## 2022-05-29 NOTE — Assessment & Plan Note (Signed)
The increased risk of cardiovascular disease associated with this diagnosis, and the need to consistently work on lifestyle to change this is discussed. Following  a  heart healthy diet ,commitment to 30 minutes of exercise at least 5 days per week, as well as control of blood sugar and cholesterol , and achieving a healthy weight are all the areas to be addressed .  

## 2022-05-29 NOTE — Telephone Encounter (Signed)
Waiting on determination 

## 2022-05-31 ENCOUNTER — Other Ambulatory Visit (HOSPITAL_BASED_OUTPATIENT_CLINIC_OR_DEPARTMENT_OTHER): Payer: Self-pay

## 2022-06-05 ENCOUNTER — Encounter: Payer: Self-pay | Admitting: Family Medicine

## 2022-06-05 DIAGNOSIS — R918 Other nonspecific abnormal finding of lung field: Secondary | ICD-10-CM

## 2022-06-10 ENCOUNTER — Ambulatory Visit: Payer: No Typology Code available for payment source | Admitting: Pulmonary Disease

## 2022-06-10 ENCOUNTER — Encounter: Payer: Self-pay | Admitting: Pulmonary Disease

## 2022-06-10 VITALS — BP 132/84 | HR 100 | Ht 61.0 in | Wt 162.8 lb

## 2022-06-10 DIAGNOSIS — C801 Malignant (primary) neoplasm, unspecified: Secondary | ICD-10-CM | POA: Diagnosis not present

## 2022-06-10 DIAGNOSIS — R918 Other nonspecific abnormal finding of lung field: Secondary | ICD-10-CM

## 2022-06-10 NOTE — Progress Notes (Signed)
[facility]  Chief Complaint  Patient presents with   Consult    Nodules on CT scan ref by Dr. Moshe Cipro     Past Surgical History:  She  has a past surgical history that includes CAESAREAN (1997/2003); CHOLECYSTECTOMY (2005); LEFT BREAST MASS REMOVED (1998); RT LOWER LOBECTOMY SECONDARY IN INFECTION (2006); Cholecystectomy; Breast biopsy (Left); TEE without cardioversion (N/A, 02/25/2019); Bubble study (02/25/2019); Cesarean section (N/A); and Foot surgery (Right, 01/2021).  Past Medical History:  Allergies, Anxiety, Depression, HLD, HTN, Insomnia, Stroke  Constitutional:  BP 132/84 (BP Location: Left Arm, Patient Position: Sitting)   Pulse 100   Ht 5\' 1"  (1.549 m)   Wt 162 lb 12.8 oz (73.8 kg)   LMP 08/22/2016   SpO2 99% Comment: ra  BMI 30.76 kg/m   Brief Summary:  Carmen Mcintyre is a 57 y.o. female with lung nodules.      Subjective:   She is a retired Marine scientist.  She was seen by Dr. Merton Border and Dr. Marlyn Corporal in 2006 for a lung mass.  She had Rt lower lobe wedge resection on 09/06/04.  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  At the time she had cough, chest congestion, sweats, fever, and weight loss.  She was seen by Dr. Lita Mains with ID and was treated with several months of fluconazole.  She recovered after this therapy.  She thinks she had exposure after burning leaves.  There was a health screening fair in [place].  She had a CT chest done on 05/29/22.  This showed several nodules: 5 mm LLL, 5 mm RLL, 2 mm RLL, 5 mm RLL, 3 mm RUL, 5 mm lingula.  She does not have cough, wheeze, sputum, fever, chest pain, sweats, weight loss, skin rash, joint/leg/gland swelling.  She doesn't feel like her breathing limits her activity.  She never smoked cigarettes.  Her father died of lung cancer.  She has a International aid/development worker and rabbit.  No recent travel.  No recent sick  exposures.  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  Chest Imaging:  CT chest 03/18/04 >> 3 x 2.1 cm mass RLL with medial cavitation, multiple smaller masses/nodules in RLL CT chest 05/29/22 >> several nodules: 5 mm LLL, 5 mm RLL, 2 mm RLL, 5 mm RLL, 3 mm RUL, 5 mm lingula.  Sleep Tests:  PSG 02/25/18 >> AHI 0.2, SpO2 low 90%  Cardiac Tests:  TEE 02/25/19 >> EF 60 to 65%  Social History:  She  reports that she has never smoked. She has never used smokeless tobacco. She reports current alcohol use. She reports that she does not use drugs.  Family History:  Her family history includes Arthritis in an other family member; Breast cancer in her maternal aunt; Cancer in her paternal grandmother; Diabetes in her mother.    Discussion:  She had screening CT chest at a recent health fair.  This showed several very small lung nodules.  She has prior history  of Cryptococcal neoformans pneumonia.  She does not have any significant respiratory symptoms at this time.  She does have family history of lung cancer, and she is concerned her CT chest findings could be related to malignancy.  Having said that, the nodules are very small and likely below resolution for a PET scan.  Also, attempting biopsy at this time would like pose increased risk for complications, such as pneumothorax, with lower percentage of yielding a diagnosis.  Assessment/Plan:   Lung nodules. - will arrange for CT abdomen/pelvis to assess whether she could have extrapulmonary source of lung  nodules - if her CT abdomen/pelvis is unrevealing, then she will need a follow up CT chest without contrast in June 2024  Time Spent Involved in Patient Care on Day of Examination:  52 minutes  Follow up:   Patient Instructions  Will arrange for CT abdomen and pelvis now.  Will arrange for CT chest in June 2024 and follow up appointment after this.  Medication List:   Allergies as of 06/10/2022       Reactions   Hydrocodone Nausea Only   Mounjaro [tirzepatide] Nausea And Vomiting        Medication List        Accurate as of June 10, 2022 10:43 AM. If you have any questions, ask your nurse or doctor.          ALPRAZolam 0.25 MG tablet Commonly known as: XANAX Take 1 tablet (0.25 mg total) by mouth 3 (three) times daily as needed for anxiety.   amphetamine-dextroamphetamine 30 MG 24 hr capsule Commonly known as: ADDERALL XR Take 1 capsule (30 mg total) by mouth every morning.   amphetamine-dextroamphetamine 10 MG tablet Commonly known as: ADDERALL Take one tablet by mouth once daily   amphetamine-dextroamphetamine 30 MG 24 hr capsule Commonly known as: ADDERALL XR Take 1 capsule (30 mg total) by mouth every morning.   amphetamine-dextroamphetamine 30 MG 24 hr capsule Commonly known as: ADDERALL XR Take 1 capsule (30 mg total) by mouth every morning.   amphetamine-dextroamphetamine 10 MG tablet Commonly known as: ADDERALL Take 1 tablet (10 mg total) by mouth daily.   amphetamine-dextroamphetamine 30 MG 24 hr capsule Commonly known as: ADDERALL XR Take 1 capsule (30 mg total) by mouth every morning.   amphetamine-dextroamphetamine 10 MG tablet Commonly known as: ADDERALL Take one tablet by mouth once daily   amphetamine-dextroamphetamine 30 MG 24 hr capsule Commonly known as: ADDERALL XR Take 1 capsule (30 mg total) by mouth every morning. Start taking on: Jul 28, 2022   amphetamine-dextroamphetamine 10 MG tablet Commonly known as: ADDERALL Take one  tablet by mouth once daily Start taking on: Jul 28, 2022   aspirin 81 MG tablet Take 1 tablet (81 mg total) by mouth daily.   atorvastatin 20 MG tablet Commonly known as: LIPITOR TAKE 1 TABLET BY MOUTH ONCE DAILY   BD Pen Needle Nano U/F 32G X 4 MM Misc Generic drug: Insulin Pen Needle Use with insulin daily.   lisinopril 20 MG tablet Commonly known as: ZESTRIL Take 1 tablet (20 mg total) by mouth daily.   Premarin vaginal cream Generic drug: conjugated estrogens USE 0.5 GRAM VAGINALLY EVERY NIGHT FOR 2 WEEKS THEN USE VAGINALLY 2 TO 3 TIMES WEEKLY   Saxenda 18 MG/3ML Sopn Generic drug: Liraglutide -Weight Management Inject 3 mg into the skin daily.   scopolamine 1 MG/3DAYS Commonly known as: TRANSDERM-SCOP Place 1 patch (1.5 mg total) onto the skin every 3 (three) days.  venlafaxine XR 75 MG 24 hr capsule Commonly known as: EFFEXOR-XR Take 1 capsule (75 mg total) by mouth daily with breakfast.   Wegovy 0.25 MG/0.5ML Soaj Generic drug: Semaglutide-Weight Management Inject 0.25 mg into the skin once a week.        Signature:  Chesley Mires, MD [place] Pager - [phone] 06/10/2022, 10:43 AM

## 2022-06-10 NOTE — Patient Instructions (Signed)
Will arrange for CT abdomen and pelvis now.  Will arrange for CT chest in June 2024 and follow up appointment after this.

## 2022-06-13 ENCOUNTER — Telehealth: Payer: Self-pay | Admitting: Pulmonary Disease

## 2022-06-13 NOTE — Telephone Encounter (Signed)
Patient calling to get CT ABDOMEN PELVIS W CONTRAST scheduled- would like to know the status. Per AVS, "Will arrange for CT abdomen and pelvis now. " Please call patient back.

## 2022-06-16 ENCOUNTER — Telehealth: Payer: Self-pay | Admitting: *Deleted

## 2022-06-16 NOTE — Telephone Encounter (Signed)
Patient called and states that [facility] in [place] has not received order for CT abdomen/pelvis and would like to check status. Please call and advise patient. [phone]

## 2022-06-17 NOTE — Telephone Encounter (Signed)
Closing message since this is a duplicate

## 2022-06-17 NOTE — Telephone Encounter (Signed)
Iris Pert has reached out to the patient. She has informed the patient that we are still waiting for auth and  the patient voiced understanding. As soon as it is authorized, I will schedule.

## 2022-06-17 NOTE — Telephone Encounter (Signed)
Called and spoke with the pt  She is calling about getting her CT scheduled  Routing to [facility] to reach out to her, thanks!

## 2022-06-30 ENCOUNTER — Encounter: Payer: Self-pay | Admitting: Family Medicine

## 2022-06-30 ENCOUNTER — Other Ambulatory Visit (HOSPITAL_BASED_OUTPATIENT_CLINIC_OR_DEPARTMENT_OTHER): Payer: Self-pay

## 2022-06-30 MED ORDER — SEMAGLUTIDE-WEIGHT MANAGEMENT 0.5 MG/0.5ML ~~LOC~~ SOAJ: 0.5000 mg | SUBCUTANEOUS | 1 refills | Status: DC

## 2022-07-01 ENCOUNTER — Other Ambulatory Visit (HOSPITAL_BASED_OUTPATIENT_CLINIC_OR_DEPARTMENT_OTHER): Payer: Self-pay

## 2022-07-01 MED ORDER — SEMAGLUTIDE-WEIGHT MANAGEMENT 0.5 MG/0.5ML ~~LOC~~ SOAJ
0.5000 mg | SUBCUTANEOUS | 1 refills | Status: DC
  Filled 2022-07-01: qty 2, 28d supply, fill #0
  Filled 2022-07-08 – 2022-08-05 (×3): qty 2, 28d supply, fill #1
  Filled 2022-10-15: qty 2, 28d supply, fill #2

## 2022-07-02 NOTE — Telephone Encounter (Signed)
Med sent to pharmacy.

## 2022-07-04 ENCOUNTER — Telehealth: Payer: Self-pay | Admitting: Pulmonary Disease

## 2022-07-04 ENCOUNTER — Encounter: Payer: Self-pay | Admitting: Pulmonary Disease

## 2022-07-04 NOTE — Telephone Encounter (Signed)
Message sent to [facility] via pt advice.

## 2022-07-04 NOTE — Telephone Encounter (Signed)
Pt. Calling wants Dr. Craige Cotta to review he result from CT there is a pt. Message with more detail

## 2022-07-07 NOTE — Telephone Encounter (Signed)
Carmen Mcintyre,  There is nothing different from what we had discussed during your visit on 06/10/22.  When ordering different tests, providers in health care need to pick certain diagnosis codes that will most likely receive approval by insurance in order to get the test scheduled.  In this situation the code for occult malignancy seemed to be the best option to get insurance approval given other information we have.  Please don't read too much into having this diagnosis code used in ordering the CT scan of your abdomen and pelvis.  It is not intended to mean that I think you definitely have cancer.  The purpose of the CT abdomen and pelvis is to confirm or refute the presence of an abnormality that could explain why you have nodules in your lungs, but we won't know this until the scan is completed.  If the CT abdomen and pelvis is negative, then we would just need follow up CT imaging of your chest to monitor for changes in the appearance of the lung nodules.  Please let me know if you have any additional questions.  Dr. Craige Cotta

## 2022-07-07 NOTE — Telephone Encounter (Signed)
Called and relayed Dr.Sood's message to pt - she verbalized understanding. NFN att.

## 2022-07-07 NOTE — Telephone Encounter (Signed)
Attempted to contact pt at listed number, but no answer.  Will send response to her mychart message.

## 2022-07-08 ENCOUNTER — Other Ambulatory Visit: Payer: Self-pay

## 2022-07-08 ENCOUNTER — Other Ambulatory Visit (HOSPITAL_BASED_OUTPATIENT_CLINIC_OR_DEPARTMENT_OTHER): Payer: Self-pay

## 2022-07-08 ENCOUNTER — Encounter: Payer: Self-pay | Admitting: Family Medicine

## 2022-07-08 MED ORDER — VENLAFAXINE HCL ER 75 MG PO CP24
75.0000 mg | ORAL_CAPSULE | Freq: Every day | ORAL | 2 refills | Status: DC
  Filled 2022-07-08: qty 90, 90d supply, fill #0
  Filled 2022-11-11: qty 90, 90d supply, fill #1
  Filled 2023-02-11: qty 90, 90d supply, fill #2

## 2022-07-08 MED ORDER — LISINOPRIL 20 MG PO TABS
20.0000 mg | ORAL_TABLET | Freq: Every day | ORAL | 2 refills | Status: DC
  Filled 2022-07-08: qty 90, 90d supply, fill #0
  Filled 2022-10-15: qty 90, 90d supply, fill #1
  Filled 2023-01-01: qty 90, 90d supply, fill #2

## 2022-07-09 ENCOUNTER — Other Ambulatory Visit (HOSPITAL_BASED_OUTPATIENT_CLINIC_OR_DEPARTMENT_OTHER): Payer: Self-pay

## 2022-07-12 ENCOUNTER — Other Ambulatory Visit (HOSPITAL_BASED_OUTPATIENT_CLINIC_OR_DEPARTMENT_OTHER): Payer: Self-pay

## 2022-07-14 ENCOUNTER — Other Ambulatory Visit (HOSPITAL_COMMUNITY): Payer: Self-pay

## 2022-07-14 ENCOUNTER — Other Ambulatory Visit (HOSPITAL_BASED_OUTPATIENT_CLINIC_OR_DEPARTMENT_OTHER): Payer: Self-pay

## 2022-07-16 ENCOUNTER — Other Ambulatory Visit (HOSPITAL_BASED_OUTPATIENT_CLINIC_OR_DEPARTMENT_OTHER): Payer: Self-pay

## 2022-07-21 ENCOUNTER — Other Ambulatory Visit (HOSPITAL_BASED_OUTPATIENT_CLINIC_OR_DEPARTMENT_OTHER): Payer: Self-pay

## 2022-07-28 ENCOUNTER — Other Ambulatory Visit (HOSPITAL_COMMUNITY): Payer: No Typology Code available for payment source

## 2022-08-05 ENCOUNTER — Other Ambulatory Visit (HOSPITAL_BASED_OUTPATIENT_CLINIC_OR_DEPARTMENT_OTHER): Payer: Self-pay

## 2022-08-13 ENCOUNTER — Telehealth: Payer: Self-pay | Admitting: Pulmonary Disease

## 2022-08-13 NOTE — Telephone Encounter (Signed)
Aundra Millet would like office notes for 06/10/2022 with Dr. Craige Cotta. Megan phone number is [phone]. Fax number is [phone].

## 2022-08-13 NOTE — Telephone Encounter (Signed)
OV notes have been faxed to [facility]. NFN

## 2022-08-18 ENCOUNTER — Encounter: Payer: Self-pay | Admitting: Family Medicine

## 2022-08-18 ENCOUNTER — Ambulatory Visit (INDEPENDENT_AMBULATORY_CARE_PROVIDER_SITE_OTHER): Payer: No Typology Code available for payment source | Admitting: Family Medicine

## 2022-08-18 ENCOUNTER — Other Ambulatory Visit (HOSPITAL_BASED_OUTPATIENT_CLINIC_OR_DEPARTMENT_OTHER): Payer: Self-pay

## 2022-08-18 VITALS — BP 119/75 | HR 78 | Ht 61.0 in | Wt 157.0 lb

## 2022-08-18 DIAGNOSIS — J069 Acute upper respiratory infection, unspecified: Secondary | ICD-10-CM | POA: Diagnosis not present

## 2022-08-18 DIAGNOSIS — R112 Nausea with vomiting, unspecified: Secondary | ICD-10-CM | POA: Diagnosis not present

## 2022-08-18 MED ORDER — ALBUTEROL SULFATE HFA 108 (90 BASE) MCG/ACT IN AERS
2.0000 | INHALATION_SPRAY | Freq: Four times a day (QID) | RESPIRATORY_TRACT | 6 refills | Status: DC | PRN
Start: 2022-08-18 — End: 2023-01-21
  Filled 2022-08-18: qty 6.7, 25d supply, fill #0

## 2022-08-18 MED ORDER — ONDANSETRON HCL 4 MG PO TABS
4.0000 mg | ORAL_TABLET | Freq: Three times a day (TID) | ORAL | 0 refills | Status: DC | PRN
  Filled 2022-08-18: qty 20, 7d supply, fill #0

## 2022-08-18 MED ORDER — PREDNISONE 20 MG PO TABS
20.0000 mg | ORAL_TABLET | Freq: Two times a day (BID) | ORAL | 0 refills | Status: AC
  Filled 2022-08-18: qty 10, 5d supply, fill #0

## 2022-08-18 MED ORDER — AMOXICILLIN-POT CLAVULANATE 875-125 MG PO TABS
1.0000 | ORAL_TABLET | Freq: Two times a day (BID) | ORAL | 0 refills | Status: AC
Start: 2022-08-18 — End: 2022-08-25
  Filled 2022-08-18: qty 14, 7d supply, fill #0

## 2022-08-18 NOTE — Progress Notes (Signed)
Patient Office Visit   Subjective   Patient ID: Carmen Mcintyre, female    DOB: Mar 06, 1966  Age: 57 y.o. MRN: [id number]  CC:  Chief Complaint  Patient presents with   Cough    Patient complains of SOB, cough, fever, vomiting for a week.     HPI Carmen Mcintyre 57 year old female, presents to establish care. She  has a past medical history of Allergy, Anxiety, Depression, Dyslipidemia, Hyperlipidemia, Hypertension, Insomnia, Stroke (HCC), and Urinary incontinence.  Patient complains of fever. Patient describes symptoms of shortness of breath on exertion, nausea, vomiting cough, fever, headache, and green sputum production. Symptoms began 6 days ago and are gradually worsening since that time. Patient denies chest pain. Treatment thus far includes OTC analgesics/antipyretics Pulmicort inhaler : somewhat effective, Past pulmonary history is significant for pneumonia     Outpatient Encounter Medications as of 08/18/2022  Medication Sig   albuterol (VENTOLIN HFA) 108 (90 Base) MCG/ACT inhaler Inhale 2 puffs into the lungs every 6 (six) hours as needed for wheezing or shortness of breath.   ALPRAZolam (XANAX) 0.25 MG tablet Take 1 tablet (0.25 mg total) by mouth 3 (three) times daily as needed for anxiety.   amoxicillin-clavulanate (AUGMENTIN) 875-125 MG tablet Take 1 tablet by mouth 2 (two) times daily for 7 days.   amphetamine-dextroamphetamine (ADDERALL XR) 30 MG 24 hr capsule Take 1 capsule (30 mg total) by mouth every morning.   amphetamine-dextroamphetamine (ADDERALL XR) 30 MG 24 hr capsule Take 1 capsule (30 mg total) by mouth every morning.   amphetamine-dextroamphetamine (ADDERALL XR) 30 MG 24 hr capsule Take 1 capsule (30 mg total) by mouth every morning.   amphetamine-dextroamphetamine (ADDERALL XR) 30 MG 24 hr capsule Take 1 capsule (30 mg total) by mouth every morning.   amphetamine-dextroamphetamine (ADDERALL XR) 30 MG 24 hr capsule Take 1 capsule (30 mg total) by mouth every  morning.   amphetamine-dextroamphetamine (ADDERALL) 10 MG tablet Take one tablet by mouth once daily   amphetamine-dextroamphetamine (ADDERALL) 10 MG tablet Take 1 tablet (10 mg total) by mouth daily.   amphetamine-dextroamphetamine (ADDERALL) 10 MG tablet Take one tablet by mouth once daily   amphetamine-dextroamphetamine (ADDERALL) 10 MG tablet Take one tablet by mouth once daily   aspirin 81 MG tablet Take 1 tablet (81 mg total) by mouth daily.   atorvastatin (LIPITOR) 20 MG tablet TAKE 1 TABLET BY MOUTH ONCE DAILY   conjugated estrogens (PREMARIN) vaginal cream USE 0.5 GRAM VAGINALLY EVERY NIGHT FOR 2 WEEKS THEN USE VAGINALLY 2 TO 3 TIMES WEEKLY   Insulin Pen Needle (BD PEN NEEDLE NANO U/F) 32G X 4 MM MISC Use with insulin daily.   lisinopril (ZESTRIL) 20 MG tablet Take 1 tablet (20 mg total) by mouth daily.   ondansetron (ZOFRAN) 4 MG tablet Take 1 tablet (4 mg total) by mouth every 8 (eight) hours as needed for nausea or vomiting.   predniSONE (DELTASONE) 20 MG tablet Take 1 tablet (20 mg total) by mouth 2 (two) times daily with a meal for 5 days.   scopolamine (TRANSDERM-SCOP) 1 MG/3DAYS Place 1 patch (1.5 mg total) onto the skin every 3 (three) days.   Semaglutide-Weight Management 0.25 MG/0.5ML SOAJ Inject 0.25 mg into the skin once a week.   Semaglutide-Weight Management 0.5 MG/0.5ML SOAJ Inject 0.5 mg into the skin once a week.   Semaglutide-Weight Management 0.5 MG/0.5ML SOAJ Inject 0.5 mg into the skin once a week.   venlafaxine XR (EFFEXOR-XR) 75 MG 24  hr capsule Take 1 capsule (75 mg total) by mouth daily with breakfast.   No facility-administered encounter medications on file as of 08/18/2022.    Past Surgical History:  Procedure Laterality Date   BREAST BIOPSY Left    benign   BUBBLE STUDY  02/25/2019   Procedure: BUBBLE STUDY;  Surgeon: Chilton Si, MD;  Location: [facility] ENDOSCOPY;  Service: Cardiovascular;;   CAESAREAN  1997/2003   CESAREAN SECTION N/A    [employer]reesia  03/17/2020   CHOLECYSTECTOMY  2005   CHOLECYSTECTOMY     FOOT SURGERY Right 01/2021   plantar fascitis   LEFT BREAST MASS REMOVED  1998   RT LOWER LOBECTOMY SECONDARY IN INFECTION  2006   TEE WITHOUT CARDIOVERSION N/A 02/25/2019   Procedure: TRANSESOPHAGEAL ECHOCARDIOGRAM (TEE);  Surgeon: Chilton Si, MD;  Location: [facility] ENDOSCOPY;  Service: Cardiovascular;  Laterality: N/A;    Review of Systems  Constitutional:  Positive for fever.  HENT:  Negative for sore throat.   Eyes:  Negative for blurred vision.  Respiratory:  Positive for cough, sputum production and shortness of breath.   Cardiovascular:  Negative for chest pain.  Gastrointestinal:  Positive for nausea and vomiting. Negative for abdominal pain.  Genitourinary:  Negative for dysuria.  Musculoskeletal:  Negative for myalgias.  Skin:  Negative for rash.  Neurological:  Positive for headaches. Negative for dizziness.      Objective    BP 119/75   Pulse 78   Ht 5\' 1"  (1.549 m)   Wt 157 lb (71.2 kg)   LMP 08/22/2016   SpO2 92%   BMI 29.66 kg/m   Physical Exam Vitals reviewed.  Constitutional:      General: She is not in acute distress.    Appearance: Normal appearance. She is not ill-appearing, toxic-appearing or diaphoretic.  HENT:     Head: Normocephalic.  Eyes:     General:        Right eye: No discharge.        Left eye: No discharge.     Conjunctiva/sclera: Conjunctivae normal.  Cardiovascular:     Rate and Rhythm: Normal rate.     Pulses: Normal pulses.     Heart sounds: Normal heart sounds.  Pulmonary:     Effort: No respiratory distress.     Breath sounds: Rhonchi present.  Abdominal:     General: Bowel sounds are normal.     Palpations: Abdomen is soft.     Tenderness: There is no abdominal tenderness. There is no right CVA tenderness, left CVA tenderness or guarding.  Musculoskeletal:        General: Normal range of motion.     Cervical back: Normal range of motion.  Skin:    General:  Skin is warm and dry.     Capillary Refill: Capillary refill takes less than 2 seconds.  Neurological:     General: No focal deficit present.     Mental Status: She is alert and oriented to person, place, and time.     Coordination: Coordination normal.     Gait: Gait normal.  Psychiatric:        Mood and Affect: Mood normal.        Behavior: Behavior normal.       Assessment & Plan:  Upper respiratory tract infection, unspecified type Assessment & Plan: Augmentin 875-125 mg x 7 days Zofran for nausea and vomiting PRN Prednisone 20 mg x 5 days Symptomatic treatment, rest, increase oral fluid intake. Take OTC tylenol for  fever.Follow-up for worsening or persistent symptoms. Patient verbalizes understanding regarding plan of care and all questions answered   Orders: -     predniSONE; Take 1 tablet (20 mg total) by mouth 2 (two) times daily with a meal for 5 days.  Dispense: 10 tablet; Refill: 0 -     Albuterol Sulfate HFA; Inhale 2 puffs into the lungs every 6 (six) hours as needed for wheezing or shortness of breath.  Dispense: 6.7 g; Refill: 6 -     Amoxicillin-Pot Clavulanate; Take 1 tablet by mouth 2 (two) times daily for 7 days.  Dispense: 14 tablet; Refill: 0  Nausea and vomiting, unspecified vomiting type -     Ondansetron HCl; Take 1 tablet (4 mg total) by mouth every 8 (eight) hours as needed for nausea or vomiting.  Dispense: 20 tablet; Refill: 0    Return if symptoms worsen or fail to improve.   Cruzita Lederer Newman Nip, FNP

## 2022-08-18 NOTE — Assessment & Plan Note (Signed)
Augmentin 875-125 mg x 7 days Zofran for nausea and vomiting PRN Prednisone 20 mg x 5 days Symptomatic treatment, rest, increase oral fluid intake. Take OTC tylenol for fever.Follow-up for worsening or persistent symptoms. Patient verbalizes understanding regarding plan of care and all questions answered

## 2022-08-18 NOTE — Patient Instructions (Signed)
        Great to see you today.   - Please take medications as prescribed. - Follow up with your primary health provider if any health concerns arises. - If symptoms worsen please contact your primary care provider and/or visit the emergency department.  

## 2022-08-20 ENCOUNTER — Encounter: Payer: Self-pay | Admitting: Pulmonary Disease

## 2022-08-26 ENCOUNTER — Other Ambulatory Visit (HOSPITAL_BASED_OUTPATIENT_CLINIC_OR_DEPARTMENT_OTHER): Payer: Self-pay

## 2022-08-26 ENCOUNTER — Ambulatory Visit (INDEPENDENT_AMBULATORY_CARE_PROVIDER_SITE_OTHER): Payer: No Typology Code available for payment source | Admitting: Family Medicine

## 2022-08-26 ENCOUNTER — Encounter: Payer: Self-pay | Admitting: Family Medicine

## 2022-08-26 VITALS — BP 104/69 | HR 84 | Ht 61.0 in | Wt 160.1 lb

## 2022-08-26 DIAGNOSIS — E785 Hyperlipidemia, unspecified: Secondary | ICD-10-CM

## 2022-08-26 DIAGNOSIS — I10 Essential (primary) hypertension: Secondary | ICD-10-CM

## 2022-08-26 DIAGNOSIS — E663 Overweight: Secondary | ICD-10-CM

## 2022-08-26 DIAGNOSIS — U071 COVID-19: Secondary | ICD-10-CM

## 2022-08-26 DIAGNOSIS — E1159 Type 2 diabetes mellitus with other circulatory complications: Secondary | ICD-10-CM | POA: Diagnosis not present

## 2022-08-26 DIAGNOSIS — R918 Other nonspecific abnormal finding of lung field: Secondary | ICD-10-CM

## 2022-08-26 DIAGNOSIS — R748 Abnormal levels of other serum enzymes: Secondary | ICD-10-CM

## 2022-08-26 LAB — CMP14+EGFR
ALT: 17 IU/L (ref 0–32)
AST: 16 IU/L (ref 0–40)
Albumin: 4.4 g/dL (ref 3.8–4.9)
Alkaline Phosphatase: 119 IU/L (ref 44–121)
BUN/Creatinine Ratio: 14 (ref 9–23)
BUN: 11 mg/dL (ref 6–24)
Bilirubin Total: 0.5 mg/dL (ref 0.0–1.2)
CO2: 28 mmol/L (ref 20–29)
Calcium: 9.1 mg/dL (ref 8.7–10.2)
Chloride: 103 mmol/L (ref 96–106)
Creatinine, Ser: 0.76 mg/dL (ref 0.57–1.00)
Globulin, Total: 2.3 g/dL (ref 1.5–4.5)
Glucose: 98 mg/dL (ref 70–99)
Potassium: 4.9 mmol/L (ref 3.5–5.2)
Sodium: 143 mmol/L (ref 134–144)
Total Protein: 6.7 g/dL (ref 6.0–8.5)
eGFR: 91 mL/min/{1.73_m2} (ref >59)

## 2022-08-26 LAB — LIPID PANEL
Chol/HDL Ratio: 2.6 ratio (ref 0.0–4.4)
Cholesterol, Total: 132 mg/dL (ref 100–199)
HDL: 50 mg/dL (ref >39)
LDL Chol Calc (NIH): 55 mg/dL (ref 0–99)
Triglycerides: 162 mg/dL — ABNORMAL HIGH (ref 0–149)
VLDL Cholesterol Cal: 27 mg/dL (ref 5–40)

## 2022-08-26 LAB — CBC
Hematocrit: 43.1 % (ref 34.0–46.6)
Hemoglobin: 14.4 g/dL (ref 11.1–15.9)
MCH: 31.2 pg (ref 26.6–33.0)
MCHC: 33.4 g/dL (ref 31.5–35.7)
MCV: 94 fL (ref 79–97)
Platelets: 281 10*3/uL (ref 150–450)
RBC: 4.61 x10E6/uL (ref 3.77–5.28)
RDW: 12.3 % (ref 11.7–15.4)
WBC: 7.8 10*3/uL (ref 3.4–10.8)

## 2022-08-26 LAB — TSH: TSH: 0.922 u[IU]/mL (ref 0.450–4.500)

## 2022-08-26 LAB — VITAMIN D 25 HYDROXY (VIT D DEFICIENCY, FRACTURES): Vit D, 25-Hydroxy: 25.9 ng/mL — ABNORMAL LOW (ref 30.0–100.0)

## 2022-08-26 LAB — HEMOGLOBIN A1C
Est. average glucose Bld gHb Est-mCnc: 120 mg/dL
Hgb A1c MFr Bld: 5.8 % — ABNORMAL HIGH (ref 4.8–5.6)

## 2022-08-26 MED ORDER — OMEPRAZOLE 20 MG PO CPDR
20.0000 mg | DELAYED_RELEASE_CAPSULE | Freq: Every day | ORAL | 1 refills | Status: DC | PRN
  Filled 2022-08-26: qty 30, 30d supply, fill #0

## 2022-08-26 NOTE — Patient Instructions (Addendum)
F/U 13 weeks call if you need me sooner  No medication, for diabetes.  All the recent symptoms are covid related and you have cleared the virus.  Please reduce cheese , butter, nuts,oils, TG are high  New is omeprazole as needed fdo reflux, please try to control with eating habits if possible   HBA1C, cmp and EGFr, lipid panel fasting 3 to 5 days before next visit  It is important that you exercise regularly at least 30 minutes 5 times a week. If you develop chest pain, have severe difficulty breathing, or feel very tired, stop exercising immediately and seek medical attention   Think about what you will eat, plan ahead. Choose " clean, green, fresh or frozen" over canned, processed or packaged foods which are more sugary, salty and fatty. 70 to 75% of food eaten should be vegetables and fruit. Three meals at set times with snacks allowed between meals, but they must be fruit or vegetables. Aim to eat over a 12 hour period , example 7 am to 7 pm, and STOP after  your last meal of the day. Drink water,generally about 64 ounces per day, no other drink is as healthy. Fruit juice is best enjoyed in a healthy way, by EATING the fruit.   Thanks for choosing [facility], we consider it a privelige to serve you.

## 2022-09-01 ENCOUNTER — Encounter: Payer: Self-pay | Admitting: Family Medicine

## 2022-09-01 ENCOUNTER — Other Ambulatory Visit (HOSPITAL_BASED_OUTPATIENT_CLINIC_OR_DEPARTMENT_OTHER): Payer: Self-pay

## 2022-09-01 DIAGNOSIS — U071 COVID-19: Secondary | ICD-10-CM | POA: Insufficient documentation

## 2022-09-01 DIAGNOSIS — R918 Other nonspecific abnormal finding of lung field: Secondary | ICD-10-CM | POA: Insufficient documentation

## 2022-09-01 MED ORDER — AMPHETAMINE-DEXTROAMPHETAMINE 10 MG PO TABS
10.0000 mg | ORAL_TABLET | Freq: Every day | ORAL | 0 refills | Status: DC
  Filled 2022-09-01: qty 90, fill #0
  Filled 2022-10-26: qty 90, 90d supply, fill #0

## 2022-09-01 MED ORDER — AMPHETAMINE-DEXTROAMPHET ER 30 MG PO CP24
30.0000 mg | ORAL_CAPSULE | ORAL | 0 refills | Status: DC
  Filled 2022-09-01 – 2022-10-27 (×3): qty 90, 90d supply, fill #0

## 2022-09-01 NOTE — Assessment & Plan Note (Signed)
Hyperlipidemia:Low fat diet discussed and encouraged.   Lipid Panel  Lab Results  Component Value Date   CHOL 132 08/25/2022   HDL 50 08/25/2022   LDLCALC 55 08/25/2022   TRIG 162 (H) 08/25/2022   CHOLHDL 2.6 08/25/2022     Needs to reduce dietary fat, no med change

## 2022-09-01 NOTE — Assessment & Plan Note (Signed)
Approximately 3 week post infection c/o brain fog, fatigue and just recovering from GI symptoms of n/v , cough and sputum production have lessend

## 2022-09-01 NOTE — Assessment & Plan Note (Signed)
Carmen Mcintyre is reminded of the importance of commitment to daily physical activity for 30 minutes or more, as able and the need to limit carbohydrate intake to 30 to 60 grams per meal to help with blood sugar control.   The need to take medication as prescribed, test blood sugar as directed, and to call between visits if there is a concern that blood sugar is uncontrolled is also discussed.   Carmen Mcintyre is reminded of the importance of daily foot exam, annual eye examination, and good blood sugar, blood pressure and cholesterol control.     Latest Ref Rng & Units 08/25/2022    9:24 AM 05/26/2022   12:59 PM 02/25/2022    9:11 AM 02/21/2022    8:37 AM 10/28/2021    2:09 PM  Diabetic Labs  HbA1c 4.8 - 5.6 % 5.8  5.6   6.3  5.9   Micro/Creat Ratio 0 - 29 mg/g creat   7     Chol 100 - 199 mg/dL 161    096    HDL >04 mg/dL 50    52    Calc LDL 0 - 99 mg/dL 55    78    Triglycerides 0 - 149 mg/dL 540    981    Creatinine 0.57 - 1.00 mg/dL 1.91  4.78   2.95  6.21       08/26/2022    8:23 AM 08/18/2022    2:43 PM 06/10/2022    9:52 AM 05/27/2022    8:24 AM 02/25/2022    8:28 AM 12/10/2021    2:21 PM 10/29/2021    9:47 AM  BP/Weight  Systolic BP 104 119 132 108 122 130 126  Diastolic BP 69 75 84 72 82 82 78  Wt. (Lbs) 160.12 157 162.8 163 163.12 160.5 159  BMI 30.25 kg/m2 29.66 kg/m2 30.76 kg/m2 30.8 kg/m2 30.82 kg/m2 31.35 kg/m2 30.04 kg/m2      Latest Ref Rng & Units 04/07/2022   12:00 AM 02/25/2022    8:20 AM  Foot/eye exam completion dates  Eye Exam No Retinopathy No Retinopathy       Foot Form Completion   Done     This result is from an external source.      May attempt to manage blood sugar with lifestyle only, medication no longer covered and she is willing and feels able to " give it a try"Has been of wegovy for over 4 weeks with goodcontrol

## 2022-09-01 NOTE — Assessment & Plan Note (Signed)
Being evaluated by pulmonary.   

## 2022-09-01 NOTE — Assessment & Plan Note (Signed)
  Patient re-educated about  the importance of commitment to a  minimum of 150 minutes of exercise per week as able.  The importance of healthy food choices with portion control discussed, as well as eating regularly and within a 12 hour window most days. The need to choose "clean , green" food 50 to 75% of the time is discussed, as well as to make water the primary drink and set a goal of 64 ounces water daily.       08/26/2022    8:23 AM 08/18/2022    2:43 PM 06/10/2022    9:52 AM  Weight /BMI  Weight 160 lb 1.9 oz 157 lb 162 lb 12.8 oz  Height 5\' 1"  (1.549 m) 5\' 1"  (1.549 m) 5\' 1"  (1.549 m)  BMI 30.25 kg/m2 29.66 kg/m2 30.76 kg/m2    Stable/ unchanged

## 2022-09-01 NOTE — Progress Notes (Signed)
NIKOLETTA VARMA     MRN: [id number]      DOB: [birth year]  Chief Complaint  Patient presents with   Follow-up    HPI Ms. Rosensteel is here for follow up and re-evaluation of chronic medical conditions, medication management and review of any available recent lab and radiology data.  Preventive health is updated, specifically  Cancer screening and Immunization.   C/o persistent fatigue and some nausea , having been diagnosed and treated with Augmentin  for respiratory tract infection which started around August 10, 2021 Of significance , which Erdine entirely forgot , shje had been diagnosed with Covid 19 infection in [place] around th e time o her symptom onset , and she vividly recalls someone sneezing into her face while there. She had no access to paxlovid and got full blow symptoms involving both GI and respiratory sympoms and now c/o persistent brain  fog and light headedness , n/v are not as sever and breathing has improved, slowly recovering\Here also to review recent labs, has ahd no wegovy for over 1 month, considering giving any similar medication for diabetes a break, and just focusing on food choice   ROS See HPI  Denies chest pains, palpitations and leg swelling Denies abdominal pain, nausea, vomiting,diarrhea or constipation.   Denies dysuria, frequency, hesitancy or incontinence. Denies joint pain, swelling and limitation in mobility. Denies headaches, seizures, numbness, or tingling. Denies depression, anxiety or insomnia. Denies skin break down or rash.   PE  BP 104/69 (BP Location: Right Arm, Patient Position: Sitting, Cuff Size: Normal)   Pulse 84   Ht 5\' 1"  (1.549 m)   Wt 160 lb 1.9 oz (72.6 kg)   LMP 08/22/2016   SpO2 96%   BMI 30.25 kg/m   Patient alert and oriented and in no cardiopulmonary distress.  HEENT: No facial asymmetry, EOMI,     Neck supple .  Chest: Clear to auscultation bilaterally.  CVS: S1, S2 no murmurs, no S3.Regular rate.  ABD: Soft non  tender.   Ext: No edema  MS: Adequate ROM spine, shoulders, hips and knees.  Skin: Intact, no ulcerations or rash noted.  Psych: Good eye contact, normal affect. Memory intact not anxious or depressed appearing.  CNS: CN 2-12 intact, power,  normal throughout.no focal deficits noted.   Assessment & Plan  COVID-19 virus infection Approximately 3 week post infection c/o brain fog, fatigue and just recovering from GI symptoms of n/v , cough and sputum production have lessend  Type 2 diabetes mellitus with vascular disease (HCC) Ms. Shi is reminded of the importance of commitment to daily physical activity for 30 minutes or more, as able and the need to limit carbohydrate intake to 30 to 60 grams per meal to help with blood sugar control.   The need to take medication as prescribed, test blood sugar as directed, and to call between visits if there is a concern that blood sugar is uncontrolled is also discussed.   Ms. Pun is reminded of the importance of daily foot exam, annual eye examination, and good blood sugar, blood pressure and cholesterol control.     Latest Ref Rng & Units 08/25/2022    9:24 AM 05/26/2022   12:59 PM 02/25/2022    9:11 AM 02/21/2022    8:37 AM 10/28/2021    2:09 PM  Diabetic Labs  HbA1c 4.8 - 5.6 % 5.8  5.6   6.3  5.9   Micro/Creat Ratio 0 - 29 mg/g creat   7  Chol 100 - 199 mg/dL 161    096    HDL >04 mg/dL 50    52    Calc LDL 0 - 99 mg/dL 55    78    Triglycerides 0 - 149 mg/dL 540    981    Creatinine 0.57 - 1.00 mg/dL 1.91  4.78   2.95  6.21       08/26/2022    8:23 AM 08/18/2022    2:43 PM 06/10/2022    9:52 AM 05/27/2022    8:24 AM 02/25/2022    8:28 AM 12/10/2021    2:21 PM 10/29/2021    9:47 AM  BP/Weight  Systolic BP 104 119 132 108 122 130 126  Diastolic BP 69 75 84 72 82 82 78  Wt. (Lbs) 160.12 157 162.8 163 163.12 160.5 159  BMI 30.25 kg/m2 29.66 kg/m2 30.76 kg/m2 30.8 kg/m2 30.82 kg/m2 31.35 kg/m2 30.04 kg/m2      Latest Ref Rng &  Units 04/07/2022   12:00 AM 02/25/2022    8:20 AM  Foot/eye exam completion dates  Eye Exam No Retinopathy No Retinopathy       Foot Form Completion   Done     This result is from an external source.      May attempt to manage blood sugar with lifestyle only, medication no longer covered and she is willing and feels able to " give it a try"Has been of wegovy for over 4 weeks with goodcontrol  Hyperlipidemia LDL goal <70 Hyperlipidemia:Low fat diet discussed and encouraged.   Lipid Panel  Lab Results  Component Value Date   CHOL 132 08/25/2022   HDL 50 08/25/2022   LDLCALC 55 08/25/2022   TRIG 162 (H) 08/25/2022   CHOLHDL 2.6 08/25/2022     Needs to reduce dietary fat, no med change  Overweight (BMI 25.0-29.9)  Patient re-educated about  the importance of commitment to a  minimum of 150 minutes of exercise per week as able.  The importance of healthy food choices with portion control discussed, as well as eating regularly and within a 12 hour window most days. The need to choose "clean , green" food 50 to 75% of the time is discussed, as well as to make water the primary drink and set a goal of 64 ounces water daily.       08/26/2022    8:23 AM 08/18/2022    2:43 PM 06/10/2022    9:52 AM  Weight /BMI  Weight 160 lb 1.9 oz 157 lb 162 lb 12.8 oz  Height 5\' 1"  (1.549 m) 5\' 1"  (1.549 m) 5\' 1"  (1.549 m)  BMI 30.25 kg/m2 29.66 kg/m2 30.76 kg/m2    Stable/ unchanged  HTN (hypertension) DASH diet and commitment to daily physical activity for a minimum of 30 minutes discussed and encouraged, as a part of hypertension management. The importance of attaining a healthy weight is also discussed.     08/26/2022    8:23 AM 08/18/2022    2:43 PM 06/10/2022    9:52 AM 05/27/2022    8:24 AM 02/25/2022    8:28 AM 12/10/2021    2:21 PM 10/29/2021    9:47 AM  BP/Weight  Systolic BP 104 119 132 108 122 130 126  Diastolic BP 69 75 84 72 82 82 78  Wt. (Lbs) 160.12 157 162.8 163 163.12  160.5 159  BMI 30.25 kg/m2 29.66 kg/m2 30.76 kg/m2 30.8 kg/m2 30.82 kg/m2 31.35 kg/m2 30.04 kg/m2  Controlled, no change in medication   Elevated alkaline phosphatase level Normal valiues x 2 in past 3 months  Multiple lung nodules on CT Being evaluated by pulmonary

## 2022-09-01 NOTE — Assessment & Plan Note (Signed)
Normal valiues x 2 in past 3 months

## 2022-09-01 NOTE — Assessment & Plan Note (Signed)
DASH diet and commitment to daily physical activity for a minimum of 30 minutes discussed and encouraged, as a part of hypertension management. The importance of attaining a healthy weight is also discussed.     08/26/2022    8:23 AM 08/18/2022    2:43 PM 06/10/2022    9:52 AM 05/27/2022    8:24 AM 02/25/2022    8:28 AM 12/10/2021    2:21 PM 10/29/2021    9:47 AM  BP/Weight  Systolic BP 104 119 132 108 122 130 126  Diastolic BP 69 75 84 72 82 82 78  Wt. (Lbs) 160.12 157 162.8 163 163.12 160.5 159  BMI 30.25 kg/m2 29.66 kg/m2 30.76 kg/m2 30.8 kg/m2 30.82 kg/m2 31.35 kg/m2 30.04 kg/m2     Controlled, no change in medication

## 2022-09-13 ENCOUNTER — Other Ambulatory Visit (HOSPITAL_BASED_OUTPATIENT_CLINIC_OR_DEPARTMENT_OTHER): Payer: Self-pay

## 2022-09-16 ENCOUNTER — Ambulatory Visit: Payer: No Typology Code available for payment source | Admitting: Pulmonary Disease

## 2022-09-16 ENCOUNTER — Other Ambulatory Visit (HOSPITAL_BASED_OUTPATIENT_CLINIC_OR_DEPARTMENT_OTHER): Payer: Self-pay

## 2022-09-16 MED ORDER — TRETINOIN 0.1 % EX CREA
1.0000 "application " | TOPICAL_CREAM | Freq: Every evening | CUTANEOUS | 3 refills | Status: AC
  Filled 2022-09-16: qty 45, 30d supply, fill #0
  Filled 2023-08-24: qty 45, 30d supply, fill #1

## 2022-09-17 NOTE — Telephone Encounter (Signed)
Dr. Craige Cotta, have you viewed her images yet?   Hello Dr. Craige Cotta.   I dropped off both chest CT's on CD mid last week.  I'm hoping you have a chance to review them with the radiologist this week.  Please call me or send a MyChart message as soon as you've seen them as I am anxiously awaiting for reply.  If there is something further I need to do to determine what this is I want to move forward quickly, please.  Thank you for your time. [employer]

## 2022-09-19 NOTE — Telephone Encounter (Signed)
Patient called back- anxious about imaging. Please advise.

## 2022-09-23 ENCOUNTER — Other Ambulatory Visit: Payer: Self-pay | Admitting: Family Medicine

## 2022-09-23 ENCOUNTER — Other Ambulatory Visit (HOSPITAL_BASED_OUTPATIENT_CLINIC_OR_DEPARTMENT_OTHER): Payer: Self-pay

## 2022-09-23 MED ORDER — ATORVASTATIN CALCIUM 20 MG PO TABS
20.0000 mg | ORAL_TABLET | Freq: Every day | ORAL | 3 refills | Status: DC
  Filled 2022-09-23: qty 90, 90d supply, fill #0

## 2022-10-15 ENCOUNTER — Other Ambulatory Visit (HOSPITAL_BASED_OUTPATIENT_CLINIC_OR_DEPARTMENT_OTHER): Payer: Self-pay

## 2022-10-16 ENCOUNTER — Encounter: Payer: Self-pay | Admitting: Family Medicine

## 2022-10-16 ENCOUNTER — Other Ambulatory Visit (HOSPITAL_BASED_OUTPATIENT_CLINIC_OR_DEPARTMENT_OTHER): Payer: Self-pay

## 2022-10-16 MED ORDER — SILVER SULFADIAZINE 1 % EX CREA
1.0000 | TOPICAL_CREAM | Freq: Every day | CUTANEOUS | 0 refills | Status: DC
  Filled 2022-10-16: qty 50, 30d supply, fill #0

## 2022-10-21 ENCOUNTER — Encounter: Payer: Self-pay | Admitting: Pulmonary Disease

## 2022-10-21 ENCOUNTER — Ambulatory Visit: Payer: No Typology Code available for payment source | Admitting: Pulmonary Disease

## 2022-10-21 VITALS — BP 132/70 | HR 99 | Ht 61.0 in | Wt 165.0 lb

## 2022-10-21 DIAGNOSIS — R918 Other nonspecific abnormal finding of lung field: Secondary | ICD-10-CM | POA: Diagnosis not present

## 2022-10-21 NOTE — Patient Instructions (Signed)
Will schedule CT chest for December 2024 and follow up after this

## 2022-10-21 NOTE — Progress Notes (Signed)
[facility]  Chief Complaint  Patient presents with   Follow-up    Review CT Chest.     Past Surgical History:  She  has a past surgical history that includes CAESAREAN (1997/2003); CHOLECYSTECTOMY (2005); LEFT BREAST MASS REMOVED (1998); RT LOWER LOBECTOMY SECONDARY IN INFECTION (2006); Cholecystectomy; Breast biopsy (Left); TEE without cardioversion (N/A, 02/25/2019); Bubble study (02/25/2019); Cesarean section (N/A); and Foot surgery (Right, 01/2021).  Past Medical History:  Allergies, Anxiety, Depression, HLD, HTN, Insomnia, Stroke  Constitutional:  BP 132/70 (BP Location: Left Arm, Cuff Size: Normal)   Pulse 99   Ht 5\' 1"  (1.549 m)   Wt 165 lb (74.8 kg)   LMP 08/22/2016   SpO2 97% Comment: on RA  BMI 31.18 kg/m   Brief Summary:  Carmen Mcintyre is a 57 y.o. female never smoker with lung nodules.  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  She has family history of lung cancer.      Subjective:   She is a retired Engineer, civil (consulting).  CT chest from June was stable to improved.  Not having cough, wheeze, sputum, fever, sweats, gland swelling, weight loss, or skin rash.  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  Chest Imaging:  CT chest 03/18/04 >> 3 x 2.1 cm mass RLL with medial cavitation, multiple smaller masses/nodules in RLL CT chest 05/29/22 >> several nodules: 5 mm LLL, 5 mm  RLL, 2 mm RLL, 5 mm RLL, 3 mm RUL, 5 mm lingula. CT chest 08/15/22 >> scattered micro-nodules RML, s/p Rt lower lobectomy  Sleep Tests:  PSG 02/25/18 >> AHI 0.2, SpO2 low 90%  Cardiac Tests:  TEE 02/25/19 >> EF 60 to 65%  Social History:  She  reports that she has never smoked. She has never used smokeless tobacco. She reports current alcohol use. She reports that she does not use drugs.  Family History:  Her family history includes Arthritis in an other family member; Breast cancer in her maternal aunt; Cancer in her paternal grandmother; Diabetes in her mother.     Assessment/Plan:   Lung nodules. - stable to improved - she has family history of lung cancer  - she has history of Cryptococcal lung mass - will get repeat CT chest without contrast in December 2024   Time Spent Involved in Patient Care on Day of Examination:  17 minutes  Follow up:   Patient Instructions  Will schedule CT chest for December 2024 and follow up after this  Medication List:   Allergies as of 10/21/2022       Reactions   Hydrocodone Nausea Only   Mounjaro [tirzepatide] Nausea And Vomiting        Medication List        Accurate as of October 21, 2022  4:18 PM. If you have any questions, ask your nurse or doctor.          STOP taking these medications  Wegovy 0.5 MG/0.5ML Soaj Generic drug: Semaglutide-Weight Management Stopped by: Coralyn Helling       TAKE these medications    albuterol 108 (90 Base) MCG/ACT inhaler Commonly known as: VENTOLIN HFA Inhale 2 puffs into the lungs every 6 (six) hours as needed for wheezing or shortness of breath.   ALPRAZolam 0.25 MG tablet Commonly known as: XANAX Take 1 tablet (0.25 mg total) by mouth 3 (three) times daily as needed for anxiety.   amphetamine-dextroamphetamine 10 MG tablet Commonly known as: ADDERALL Take one tablet by mouth once daily   amphetamine-dextroamphetamine 30 MG 24 hr capsule Commonly known as: ADDERALL  XR Take 1 capsule (30 mg total) by mouth every morning.   amphetamine-dextroamphetamine 30 MG 24 hr capsule Commonly known as: ADDERALL XR Take 1 capsule (30 mg total) by mouth every morning.   amphetamine-dextroamphetamine 10 MG tablet Commonly known as: Adderall Take one tablet by mouth once daily   aspirin 81 MG tablet Take 1 tablet (81 mg total) by mouth daily.   atorvastatin 20 MG tablet Commonly known as: LIPITOR TAKE 1 TABLET BY MOUTH ONCE DAILY What changed: Another medication with the same name was removed. Continue taking this medication, and follow the directions you see here. Changed by: Coralyn Helling   BD Pen Needle Nano U/F 32G X 4 MM Misc Generic drug: Insulin Pen Needle Use with insulin daily.   lisinopril 20 MG tablet Commonly known as: ZESTRIL Take 1 tablet (20 mg total) by mouth daily.   omeprazole 20 MG capsule Commonly known as: PRILOSEC Take 1 capsule (20 mg total) by mouth daily as needed.   ondansetron 4 MG tablet Commonly known as: Zofran Take 1 tablet (4 mg total) by mouth every 8 (eight) hours as needed for nausea or vomiting.   Premarin vaginal cream Generic drug: conjugated estrogens USE 0.5 GRAM VAGINALLY EVERY NIGHT FOR 2 WEEKS THEN USE VAGINALLY 2 TO 3 TIMES WEEKLY   scopolamine 1 MG/3DAYS Commonly known as: TRANSDERM-SCOP Place 1 patch (1.5 mg total) onto the skin every 3 (three) days.   silver sulfADIAZINE 1 % cream Commonly known as: SILVADENE Apply 1 Application topically daily.   tretinoin 0.1 % cream Commonly known as: RETIN-A Apply 1 application topically to face every evening.   venlafaxine XR 75 MG 24 hr capsule Commonly known as: EFFEXOR-XR Take 1 capsule (75 mg total) by mouth daily with breakfast.        Signature:  Coralyn Helling, MD [facility] Pulmonary/Critical Care Pager - [phone] 10/21/2022, 4:18 PM

## 2022-10-27 ENCOUNTER — Other Ambulatory Visit (HOSPITAL_BASED_OUTPATIENT_CLINIC_OR_DEPARTMENT_OTHER): Payer: Self-pay

## 2022-10-27 ENCOUNTER — Other Ambulatory Visit: Payer: Self-pay

## 2022-10-29 ENCOUNTER — Other Ambulatory Visit (HOSPITAL_BASED_OUTPATIENT_CLINIC_OR_DEPARTMENT_OTHER): Payer: Self-pay

## 2022-11-05 ENCOUNTER — Encounter: Payer: Self-pay | Admitting: Family Medicine

## 2022-11-06 ENCOUNTER — Other Ambulatory Visit (HOSPITAL_BASED_OUTPATIENT_CLINIC_OR_DEPARTMENT_OTHER): Payer: Self-pay

## 2022-11-06 MED ORDER — OZEMPIC (0.25 OR 0.5 MG/DOSE) 2 MG/3ML ~~LOC~~ SOPN
0.2500 mg | PEN_INJECTOR | SUBCUTANEOUS | 0 refills | Status: DC
Start: 2022-11-06 — End: 2023-02-11
  Filled 2022-11-06: qty 3, 28d supply, fill #0

## 2022-11-11 ENCOUNTER — Other Ambulatory Visit (HOSPITAL_BASED_OUTPATIENT_CLINIC_OR_DEPARTMENT_OTHER): Payer: Self-pay

## 2022-11-12 ENCOUNTER — Other Ambulatory Visit (HOSPITAL_BASED_OUTPATIENT_CLINIC_OR_DEPARTMENT_OTHER): Payer: Self-pay

## 2022-11-17 ENCOUNTER — Other Ambulatory Visit (HOSPITAL_BASED_OUTPATIENT_CLINIC_OR_DEPARTMENT_OTHER): Payer: Self-pay

## 2022-11-19 ENCOUNTER — Other Ambulatory Visit (HOSPITAL_BASED_OUTPATIENT_CLINIC_OR_DEPARTMENT_OTHER): Payer: Self-pay

## 2022-11-24 ENCOUNTER — Telehealth: Payer: Self-pay | Admitting: Family Medicine

## 2022-11-24 NOTE — Telephone Encounter (Signed)
Rx Benefits calling wanting to speak with someone regarding prior auth for pt Ozempic. Please advise [phone] Thank you

## 2022-11-25 NOTE — Telephone Encounter (Signed)
Unable to speak with representative will wait for return call

## 2022-11-28 ENCOUNTER — Ambulatory Visit: Payer: No Typology Code available for payment source | Admitting: Family Medicine

## 2022-11-28 NOTE — Telephone Encounter (Signed)
RX Benefits called asking for a call back at [phone]

## 2022-11-28 NOTE — Telephone Encounter (Signed)
Patient called back asking to recall [phone] to get approval done.

## 2022-11-30 ENCOUNTER — Other Ambulatory Visit (HOSPITAL_BASED_OUTPATIENT_CLINIC_OR_DEPARTMENT_OTHER): Payer: Self-pay

## 2022-12-03 ENCOUNTER — Other Ambulatory Visit (HOSPITAL_BASED_OUTPATIENT_CLINIC_OR_DEPARTMENT_OTHER): Payer: Self-pay

## 2022-12-03 ENCOUNTER — Encounter: Payer: Self-pay | Admitting: Family Medicine

## 2022-12-03 NOTE — Telephone Encounter (Signed)
Spoke with rx benefits medication is under review

## 2022-12-10 ENCOUNTER — Other Ambulatory Visit (HOSPITAL_BASED_OUTPATIENT_CLINIC_OR_DEPARTMENT_OTHER): Payer: Self-pay

## 2022-12-11 ENCOUNTER — Other Ambulatory Visit (HOSPITAL_BASED_OUTPATIENT_CLINIC_OR_DEPARTMENT_OTHER): Payer: Self-pay

## 2022-12-13 ENCOUNTER — Other Ambulatory Visit (HOSPITAL_BASED_OUTPATIENT_CLINIC_OR_DEPARTMENT_OTHER): Payer: Self-pay

## 2023-01-01 ENCOUNTER — Other Ambulatory Visit (HOSPITAL_BASED_OUTPATIENT_CLINIC_OR_DEPARTMENT_OTHER): Payer: Self-pay

## 2023-01-10 ENCOUNTER — Other Ambulatory Visit (HOSPITAL_BASED_OUTPATIENT_CLINIC_OR_DEPARTMENT_OTHER): Payer: Self-pay

## 2023-01-10 ENCOUNTER — Other Ambulatory Visit: Payer: Self-pay | Admitting: Family Medicine

## 2023-01-12 ENCOUNTER — Other Ambulatory Visit (HOSPITAL_BASED_OUTPATIENT_CLINIC_OR_DEPARTMENT_OTHER): Payer: Self-pay

## 2023-01-12 MED ORDER — ATORVASTATIN CALCIUM 20 MG PO TABS
20.0000 mg | ORAL_TABLET | Freq: Every day | ORAL | 3 refills | Status: DC
  Filled 2023-01-12: qty 90, 90d supply, fill #0
  Filled 2023-04-20: qty 90, 90d supply, fill #1
  Filled 2023-07-15: qty 90, 90d supply, fill #2
  Filled 2023-12-03: qty 90, 90d supply, fill #3

## 2023-01-14 ENCOUNTER — Other Ambulatory Visit: Payer: Self-pay | Admitting: Family Medicine

## 2023-01-14 ENCOUNTER — Other Ambulatory Visit (HOSPITAL_BASED_OUTPATIENT_CLINIC_OR_DEPARTMENT_OTHER): Payer: Self-pay

## 2023-01-14 ENCOUNTER — Encounter: Payer: Self-pay | Admitting: Family Medicine

## 2023-01-14 DIAGNOSIS — Z1231 Encounter for screening mammogram for malignant neoplasm of breast: Secondary | ICD-10-CM

## 2023-01-14 MED ORDER — SEMAGLUTIDE (1 MG/DOSE) 4 MG/3ML ~~LOC~~ SOPN
1.0000 mg | PEN_INJECTOR | SUBCUTANEOUS | 2 refills | Status: DC
  Filled 2023-01-14: qty 3, 28d supply, fill #0

## 2023-01-15 ENCOUNTER — Other Ambulatory Visit (HOSPITAL_BASED_OUTPATIENT_CLINIC_OR_DEPARTMENT_OTHER): Payer: Self-pay

## 2023-01-15 MED ORDER — COMIRNATY 30 MCG/0.3ML IM SUSY
0.3000 mL | PREFILLED_SYRINGE | Freq: Once | INTRAMUSCULAR | 0 refills | Status: AC
  Filled 2023-01-15: qty 0.3, 1d supply, fill #0

## 2023-01-15 MED ORDER — FLULAVAL 0.5 ML IM SUSY
0.5000 mL | PREFILLED_SYRINGE | Freq: Once | INTRAMUSCULAR | 0 refills | Status: AC
  Filled 2023-01-15: qty 0.5, 1d supply, fill #0

## 2023-01-21 ENCOUNTER — Encounter: Payer: Self-pay | Admitting: Family Medicine

## 2023-01-21 ENCOUNTER — Ambulatory Visit (INDEPENDENT_AMBULATORY_CARE_PROVIDER_SITE_OTHER): Payer: No Typology Code available for payment source | Admitting: Family Medicine

## 2023-01-21 ENCOUNTER — Other Ambulatory Visit (HOSPITAL_BASED_OUTPATIENT_CLINIC_OR_DEPARTMENT_OTHER): Payer: Self-pay

## 2023-01-21 VITALS — BP 137/71 | HR 102 | Ht 61.0 in | Wt 164.0 lb

## 2023-01-21 DIAGNOSIS — Z23 Encounter for immunization: Secondary | ICD-10-CM | POA: Diagnosis not present

## 2023-01-21 DIAGNOSIS — G9332 Myalgic encephalomyelitis/chronic fatigue syndrome: Secondary | ICD-10-CM

## 2023-01-21 DIAGNOSIS — I10 Essential (primary) hypertension: Secondary | ICD-10-CM

## 2023-01-21 DIAGNOSIS — E785 Hyperlipidemia, unspecified: Secondary | ICD-10-CM

## 2023-01-21 DIAGNOSIS — E1149 Type 2 diabetes mellitus with other diabetic neurological complication: Secondary | ICD-10-CM | POA: Diagnosis not present

## 2023-01-21 DIAGNOSIS — R918 Other nonspecific abnormal finding of lung field: Secondary | ICD-10-CM

## 2023-01-21 DIAGNOSIS — E663 Overweight: Secondary | ICD-10-CM

## 2023-01-21 MED ORDER — AMPHETAMINE-DEXTROAMPHETAMINE 20 MG PO TABS
20.0000 mg | ORAL_TABLET | Freq: Every evening | ORAL | 0 refills | Status: DC
  Filled 2023-01-21: qty 30, 30d supply, fill #0

## 2023-01-21 NOTE — Patient Instructions (Addendum)
Follow-up in 4 months, call if you need me sooner.  Tdap at visit today.  Dose increase in Adderall short acting as discussed to 20 mg tablet 1 daily.  The long-acting dose remains the same.  Please get fasting labs by mid December I will message you with results.  The tingling in your hands after excessive use of the hands is likely due to irritation of the nerve because of overuse.   I do recommend some reevaluation of your brain scan based on your  previous history of a stroke and new neurologic symptoms .  Consideration should should also be given to having a neurologist reevaluate you.  As discussed you will let me know your decision.    Nurse pls ad B12 level to labs already ordered  Keep up and  continue  to challenge yourself on good health habits  Best for the season and new year  Thanks for choosing [facility], we consider it a privelige to serve you.

## 2023-01-23 ENCOUNTER — Telehealth: Payer: Self-pay | Admitting: Pulmonary Disease

## 2023-01-28 NOTE — Telephone Encounter (Signed)
LVMTCB to schedule 4 month rov.

## 2023-01-28 NOTE — Telephone Encounter (Signed)
Patient need f/u visit with another provider per LOV   Instructions   Return in about 4 months (around 02/20/2023). Will schedule CT chest for December 2024 and follow up after this      AND needs CT scheduled

## 2023-02-03 ENCOUNTER — Ambulatory Visit: Payer: No Typology Code available for payment source | Admitting: Family Medicine

## 2023-02-09 ENCOUNTER — Encounter: Payer: Self-pay | Admitting: Family Medicine

## 2023-02-09 ENCOUNTER — Other Ambulatory Visit (HOSPITAL_BASED_OUTPATIENT_CLINIC_OR_DEPARTMENT_OTHER): Payer: Self-pay

## 2023-02-09 ENCOUNTER — Other Ambulatory Visit: Payer: Self-pay

## 2023-02-09 ENCOUNTER — Telehealth: Payer: Self-pay | Admitting: Internal Medicine

## 2023-02-09 DIAGNOSIS — Z23 Encounter for immunization: Secondary | ICD-10-CM | POA: Insufficient documentation

## 2023-02-09 MED ORDER — AMPHETAMINE-DEXTROAMPHET ER 30 MG PO CP24
30.0000 mg | ORAL_CAPSULE | ORAL | 0 refills | Status: AC
Start: 2023-02-09 — End: ?
  Filled 2023-02-09: qty 90, 90d supply, fill #0

## 2023-02-09 NOTE — Assessment & Plan Note (Signed)
  Patient re-educated about  the importance of commitment to a  minimum of 150 minutes of exercise per week as able.  The importance of healthy food choices with portion control discussed, as well as eating regularly and within a 12 hour window most days. The need to choose "clean , green" food 50 to 75% of the time is discussed, as well as to make water the primary drink and set a goal of 64 ounces water daily.       01/21/2023    2:41 PM 10/21/2022    4:00 PM 08/26/2022    8:23 AM  Weight /BMI  Weight 164 lb 0.6 oz 165 lb 160 lb 1.9 oz  Height 5\' 1"  (1.549 m) 5\' 1"  (1.549 m) 5\' 1"  (1.549 m)  BMI 31 kg/m2 31.18 kg/m2 30.25 kg/m2    Unchanged

## 2023-02-09 NOTE — Assessment & Plan Note (Signed)
Dose increase in short acting adderall long acting the same , pt will send message to see if this improves her level of function

## 2023-02-09 NOTE — Assessment & Plan Note (Signed)
Hyperlipidemia:Low fat diet discussed and encouraged.   Lipid Panel  Lab Results  Component Value Date   CHOL 132 08/25/2022   HDL 50 08/25/2022   LDLCALC 55 08/25/2022   TRIG 162 (H) 08/25/2022   CHOLHDL 2.6 08/25/2022     Updated lab needed at/ before next visit.

## 2023-02-09 NOTE — Progress Notes (Signed)
Carmen Mcintyre     MRN: [id number]      DOB: [birth year]  Chief Complaint  Patient presents with   Follow-up    Follow up    HPI [name] is here for follow up and re-evaluation of chronic medical conditions, medication management and review of any available recent lab and radiology data.  Preventive health is updated, specifically  Cancer screening and Immunization.   Questions or concerns regarding consultations or procedures which the PT has had in the interim are  addressed. The PT denies any adverse reactions to current medications since the last visit.  C/o increased daytime fatigue despite good eating and exercise habits, will try higher dose of the short acting Adderall and  C/o new tingling in digits, has had abn  brain scan   in the past with question of MS Has not seen Neurology for over 2 years and last brain scan in 2022 was essentially unchanged since 2021 wit non specific abnormalities noted  ROS Denies recent fever or chills. Denies sinus pressure, nasal congestion, ear pain or sore throat. Denies chest congestion, productive cough or wheezing. Denies chest pains, palpitations and leg swelling Denies abdominal pain, nausea, vomiting,diarrhea or constipation.   Denies dysuria, frequency, hesitancy or incontinence. Denies joint pain, swelling and limitation in mobility. Denies headaches, seizures,. Denies uncontrolled  depression, anxiety or insomnia. Denies skin break down or rash.   PE  BP 137/71 (BP Location: Right Arm, Patient Position: Sitting, Cuff Size: Large)   Pulse (!) 102   Ht 5\' 1"  (1.549 m)   Wt 164 lb 0.6 oz (74.4 kg)   LMP 08/22/2016   SpO2 98%   BMI 31.00 kg/m   Patient alert and oriented and in no cardiopulmonary distress.  HEENT: No facial asymmetry, EOMI,     Neck supple .  Chest: Clear to auscultation bilaterally.  CVS: S1, S2 no murmurs, no S3.Regular rate.  ABD: Soft non tender.   Ext: No edema  MS: Adequate ROM spine,  shoulders, hips and knees.  Skin: Intact, no ulcerations or rash noted.  Psych: Good eye contact, normal affect. Memory intact not anxious or depressed appearing.  CNS: CN 2-12 intact, power,  normal throughout.no focal deficits noted.   Assessment & Plan  HTN (hypertension) Elevated at visit, will monitor , and may need to inc med dose  DASH diet and commitment to daily physical activity for a minimum of 30 minutes discussed and encouraged, as a part of hypertension management. The importance of attaining a healthy weight is also discussed.     01/21/2023    2:41 PM 10/21/2022    4:00 PM 08/26/2022    8:23 AM 08/18/2022    2:43 PM 06/10/2022    9:52 AM 05/27/2022    8:24 AM 02/25/2022    8:28 AM  BP/Weight  Systolic BP 137 132 104 119 132 108 122  Diastolic BP 71 70 69 75 84 72 82  Wt. (Lbs) 164.04 165 160.12 157 162.8 163 163.12  BMI 31 kg/m2 31.18 kg/m2 30.25 kg/m2 29.66 kg/m2 30.76 kg/m2 30.8 kg/m2 30.82 kg/m2       Encounter for immunization After obtaining informed consent, the TdAP vaccine is  administered , with no adverse effect noted at the time of administration.   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  Overweight (BMI 25.0-29.9)  Patient re-educated about  the importance of commitment to a  minimum of 150 minutes of exercise per week as able.  The importance of healthy food choices with portion control discussed, as well as eating regularly and within a 12 hour window most days. The need to choose "clean , green" food 50 to 75% of the time is discussed, as well as to make water the primary drink and set a goal of 64 ounces water daily.       01/21/2023    2:41 PM 10/21/2022    4:00 PM 08/26/2022    8:23 AM  Weight /BMI  Weight 164 lb 0.6 oz 165 lb 160 lb 1.9 oz  Height 5\' 1"  (1.549 m) 5'  1" (1.549 m) 5\' 1"  (1.549 m)  BMI 31 kg/m2 31.18 kg/m2 30.25 kg/m2    Unchanged  Multiple lung nodules on CT Being followed by Pulmonary  Chronic fatigue disorder Dose increase in short acting adderall long acting the same , pt will send message to see if this improves her level of function  Hyperlipidemia LDL goal <70 Hyperlipidemia:Low fat diet discussed and encouraged.   Lipid Panel  Lab Results  Component Value Date   CHOL 132 08/25/2022   HDL 50 08/25/2022   LDLCALC 55 08/25/2022   TRIG 162 (H) 08/25/2022   CHOLHDL 2.6 08/25/2022     Updated lab needed at/ before next visit.

## 2023-02-09 NOTE — Assessment & Plan Note (Signed)
Elevated at visit, will monitor , and may need to inc med dose  DASH diet and commitment to daily physical activity for a minimum of 30 minutes discussed and encouraged, as a part of hypertension management. The importance of attaining a healthy weight is also discussed.     01/21/2023    2:41 PM 10/21/2022    4:00 PM 08/26/2022    8:23 AM 08/18/2022    2:43 PM 06/10/2022    9:52 AM 05/27/2022    8:24 AM 02/25/2022    8:28 AM  BP/Weight  Systolic BP 137 132 104 119 132 108 122  Diastolic BP 71 70 69 75 84 72 82  Wt. (Lbs) 164.04 165 160.12 157 162.8 163 163.12  BMI 31 kg/m2 31.18 kg/m2 30.25 kg/m2 29.66 kg/m2 30.76 kg/m2 30.8 kg/m2 30.82 kg/m2

## 2023-02-09 NOTE — Assessment & Plan Note (Signed)
Reports new upper extremity tingling at tips of digits, has had CVA in the past and has abnormalities on brain scan Updated lab needed at/ before next visit.

## 2023-02-09 NOTE — Assessment & Plan Note (Signed)
Being followed by Pulmonary

## 2023-02-09 NOTE — Assessment & Plan Note (Signed)
 After obtaining informed consent, the TdAP vaccine is  administered , with no adverse effect noted at the time of administration.

## 2023-02-11 ENCOUNTER — Other Ambulatory Visit: Payer: Self-pay | Admitting: Family Medicine

## 2023-02-11 ENCOUNTER — Other Ambulatory Visit (HOSPITAL_BASED_OUTPATIENT_CLINIC_OR_DEPARTMENT_OTHER): Payer: Self-pay

## 2023-02-11 MED ORDER — SEMAGLUTIDE (2 MG/DOSE) 8 MG/3ML ~~LOC~~ SOPN
2.0000 mg | PEN_INJECTOR | SUBCUTANEOUS | 1 refills | Status: DC
  Filled 2023-02-11: qty 9, 84d supply, fill #0

## 2023-02-11 MED ORDER — OZEMPIC (1 MG/DOSE) 4 MG/3ML ~~LOC~~ SOPN
1.0000 mg | PEN_INJECTOR | SUBCUTANEOUS | 2 refills | Status: DC
  Filled 2023-02-11: qty 3, 28d supply, fill #0

## 2023-02-12 ENCOUNTER — Other Ambulatory Visit (HOSPITAL_BASED_OUTPATIENT_CLINIC_OR_DEPARTMENT_OTHER): Payer: Self-pay

## 2023-02-12 ENCOUNTER — Other Ambulatory Visit: Payer: Self-pay | Admitting: Family Medicine

## 2023-02-12 MED ORDER — AMPHETAMINE-DEXTROAMPHETAMINE 20 MG PO TABS
20.0000 mg | ORAL_TABLET | Freq: Every day | ORAL | 0 refills | Status: DC
  Filled 2023-04-15 – 2023-04-23 (×2): qty 90, 90d supply, fill #0
  Filled ????-??-??: fill #0

## 2023-02-20 ENCOUNTER — Ambulatory Visit
Admission: RE | Admit: 2023-02-20 | Discharge: 2023-02-20 | Disposition: A | Discharge status: Home / Self Care | Payer: No Typology Code available for payment source | Source: Ambulatory Visit | Attending: Family Medicine | Admitting: Family Medicine

## 2023-02-20 DIAGNOSIS — Z1231 Encounter for screening mammogram for malignant neoplasm of breast: Secondary | ICD-10-CM

## 2023-02-25 ENCOUNTER — Ambulatory Visit: Payer: No Typology Code available for payment source | Admitting: Adult Health

## 2023-02-26 ENCOUNTER — Other Ambulatory Visit: Payer: Self-pay

## 2023-02-26 DIAGNOSIS — R918 Other nonspecific abnormal finding of lung field: Secondary | ICD-10-CM

## 2023-03-30 ENCOUNTER — Other Ambulatory Visit: Payer: Self-pay | Admitting: Family Medicine

## 2023-04-02 ENCOUNTER — Encounter: Payer: Self-pay | Admitting: Adult Health

## 2023-04-02 ENCOUNTER — Ambulatory Visit: Payer: No Typology Code available for payment source | Admitting: Adult Health

## 2023-04-02 VITALS — BP 135/80 | HR 107 | Ht 60.0 in | Wt 161.5 lb

## 2023-04-02 DIAGNOSIS — Z1331 Encounter for screening for depression: Secondary | ICD-10-CM | POA: Diagnosis not present

## 2023-04-02 DIAGNOSIS — N951 Menopausal and female climacteric states: Secondary | ICD-10-CM | POA: Diagnosis not present

## 2023-04-02 DIAGNOSIS — Z01419 Encounter for gynecological examination (general) (routine) without abnormal findings: Secondary | ICD-10-CM

## 2023-04-02 DIAGNOSIS — Z1211 Encounter for screening for malignant neoplasm of colon: Secondary | ICD-10-CM | POA: Insufficient documentation

## 2023-04-02 DIAGNOSIS — I10 Essential (primary) hypertension: Secondary | ICD-10-CM | POA: Diagnosis not present

## 2023-04-02 LAB — HEMOCCULT GUIAC POC 1CARD (OFFICE): Fecal Occult Blood, POC: NEGATIVE

## 2023-04-02 NOTE — Progress Notes (Signed)
Patient ID: Carmen Mcintyre, female   DOB: [birth year], 58 y.o.   MRN: [id number] History of Present Illness: Carmen Mcintyre is a 58 year old white female,married, PM in for a well woman gyn exam. She is retired.     Component Value Date/Time   DIAGPAP  12/10/2021 1428    - Negative for intraepithelial lesion or malignancy (NILM)   DIAGPAP  02/10/2019 0859    - Negative for intraepithelial lesion or malignancy (NILM)   HPVHIGH Negative 12/10/2021 1428   HPVHIGH Negative 02/10/2019 0859   ADEQPAP  12/10/2021 1428    Satisfactory for evaluation; transformation zone component PRESENT.   ADEQPAP  02/10/2019 0859    Satisfactory for evaluation; transformation zone component PRESENT.    PCP is Dr Lodema Hong.    Current Medications, Allergies, Past Medical History, Past Surgical History, Family History and Social History were reviewed in [employer] record.     Review of Systems: Patient denies any headaches, hearing loss, fatigue, blurred vision, shortness of breath, chest pain, abdominal pain, problems with bowel movements, urination, or intercourse. No joint pain or mood swings.  Vaginal dryness is much better using PVC Denies any vaginal bleeding  Physical Exam:BP 135/80 (BP Location: Right Arm, Patient Position: Sitting, Cuff Size: Normal)   Pulse (!) 107   Ht 5' (1.524 m)   Wt 161 lb 8 oz (73.3 kg)   LMP 08/22/2016   BMI 31.54 kg/m   General:  Well developed, well nourished, no acute distress Skin:  Warm and dry Neck:  Midline trachea, normal thyroid, good ROM, no lymphadenopathy Lungs; Clear to auscultation bilaterally Breast:  No dominant palpable mass, retraction, or nipple discharge Cardiovascular: Regular rate and rhythm Abdomen:  Soft, non tender, no hepatosplenomegaly Pelvic:  External genitalia is normal in appearance, no lesions.  The vagina is pale pink. Urethra has no lesions or masses. The cervix is smooth. Uterus is felt to be normal size, shape, and  contour.  No adnexal masses or tenderness noted.Bladder is non tender, no masses felt. Rectal: Good sphincter tone, no polyps, or hemorrhoids felt.  Hemoccult negative. Extremities/musculoskeletal:  No swelling or varicosities noted, no clubbing or cyanosis Psych:  No mood changes, alert and cooperative,seems happy AA is 1 Fall risk is low    04/02/2023   11:34 AM 01/21/2023    2:42 PM 08/26/2022    8:24 AM  Depression screen PHQ 2/9  Decreased Interest 0 0 0  Down, Depressed, Hopeless 0 0 0  PHQ - 2 Score 0 0 0  Altered sleeping 0  0  Tired, decreased energy 0  0  Change in appetite 0  0  Feeling bad or failure about yourself  0  0  Trouble concentrating 0  0  Moving slowly or fidgety/restless 0  0  Suicidal thoughts 0  0  PHQ-9 Score 0  0  Difficult doing work/chores   Not difficult at all       04/02/2023   11:35 AM 08/26/2022    8:24 AM 08/18/2022    2:44 PM 05/27/2022    8:26 AM  GAD 7 : Generalized Anxiety Score  Nervous, Anxious, on Edge 0 0 2 0  Control/stop worrying 0 0 0 0  Worry too much - different things 0 0 0 0  Trouble relaxing 0 0 0 0  Restless 0 0 0 0  Easily annoyed or irritable 0 0 0 0  Afraid - awful might happen 0 0 0 0  Total GAD  7 Score 0 0 2 0  Anxiety Difficulty  Not difficult at all Not difficult at all Not difficult at all    Upstream - 04/02/23 1134       Pregnancy Intention Screening   Does the patient want to become pregnant in the next year? N/A    Does the patient's partner want to become pregnant in the next year? N/A    Would the patient like to discuss contraceptive options today? N/A      Contraception Wrap Up   Current Method No Method - Other Reason   PM   End Method No Method - Other Reason   PM   Contraception Counseling Provided No              Examination chaperoned by Malachy Mood LPN  Impression and plan: 1. Encounter for well woman exam with routine gynecological exam (Primary) Pap and physical in 1 year Labs with  PCP Mammogram was negative 02/20/23 Colonoscopy per GI   2. Encounter for screening fecal occult blood testing Hemoccult was negative  - POCT occult blood stool  3. Vaginal dryness, menopausal Much better on premarin vaginal cream   4. Primary hypertension Take meds and follow up with PCP

## 2023-04-06 ENCOUNTER — Ambulatory Visit: Payer: No Typology Code available for payment source | Admitting: Internal Medicine

## 2023-04-09 LAB — HM DIABETES EYE EXAM

## 2023-04-15 ENCOUNTER — Other Ambulatory Visit (HOSPITAL_BASED_OUTPATIENT_CLINIC_OR_DEPARTMENT_OTHER): Payer: Self-pay

## 2023-04-15 ENCOUNTER — Other Ambulatory Visit: Payer: Self-pay | Admitting: Family Medicine

## 2023-04-19 ENCOUNTER — Other Ambulatory Visit: Payer: Self-pay | Admitting: Adult Health

## 2023-04-20 ENCOUNTER — Other Ambulatory Visit (HOSPITAL_BASED_OUTPATIENT_CLINIC_OR_DEPARTMENT_OTHER): Payer: Self-pay

## 2023-04-22 ENCOUNTER — Other Ambulatory Visit (HOSPITAL_BASED_OUTPATIENT_CLINIC_OR_DEPARTMENT_OTHER): Payer: Self-pay

## 2023-04-22 ENCOUNTER — Other Ambulatory Visit: Payer: Self-pay | Admitting: Family Medicine

## 2023-04-22 MED ORDER — LISINOPRIL 20 MG PO TABS
20.0000 mg | ORAL_TABLET | Freq: Every day | ORAL | 2 refills | Status: DC
  Filled 2023-04-22: qty 90, 90d supply, fill #0
  Filled 2023-07-15: qty 90, 90d supply, fill #1
  Filled 2023-10-30: qty 90, 90d supply, fill #2

## 2023-04-23 ENCOUNTER — Other Ambulatory Visit (HOSPITAL_BASED_OUTPATIENT_CLINIC_OR_DEPARTMENT_OTHER): Payer: Self-pay

## 2023-05-07 ENCOUNTER — Other Ambulatory Visit (HOSPITAL_BASED_OUTPATIENT_CLINIC_OR_DEPARTMENT_OTHER): Payer: Self-pay

## 2023-05-07 ENCOUNTER — Other Ambulatory Visit (HOSPITAL_COMMUNITY): Payer: Self-pay

## 2023-05-14 ENCOUNTER — Other Ambulatory Visit: Payer: Self-pay | Admitting: Family Medicine

## 2023-05-14 ENCOUNTER — Other Ambulatory Visit (HOSPITAL_BASED_OUTPATIENT_CLINIC_OR_DEPARTMENT_OTHER): Payer: Self-pay

## 2023-05-14 ENCOUNTER — Encounter: Payer: Self-pay | Admitting: Family Medicine

## 2023-05-14 ENCOUNTER — Ambulatory Visit: Payer: No Typology Code available for payment source | Admitting: Family Medicine

## 2023-05-14 VITALS — BP 121/73 | HR 69 | Resp 16 | Ht 61.0 in | Wt 163.0 lb

## 2023-05-14 DIAGNOSIS — E66811 Obesity, class 1: Secondary | ICD-10-CM

## 2023-05-14 DIAGNOSIS — G9332 Myalgic encephalomyelitis/chronic fatigue syndrome: Secondary | ICD-10-CM

## 2023-05-14 DIAGNOSIS — Z7985 Long-term (current) use of injectable non-insulin antidiabetic drugs: Secondary | ICD-10-CM

## 2023-05-14 DIAGNOSIS — E1149 Type 2 diabetes mellitus with other diabetic neurological complication: Secondary | ICD-10-CM | POA: Diagnosis not present

## 2023-05-14 DIAGNOSIS — F419 Anxiety disorder, unspecified: Secondary | ICD-10-CM

## 2023-05-14 DIAGNOSIS — K753 Granulomatous hepatitis, not elsewhere classified: Secondary | ICD-10-CM | POA: Diagnosis not present

## 2023-05-14 DIAGNOSIS — R918 Other nonspecific abnormal finding of lung field: Secondary | ICD-10-CM

## 2023-05-14 DIAGNOSIS — I10 Essential (primary) hypertension: Secondary | ICD-10-CM

## 2023-05-14 DIAGNOSIS — R748 Abnormal levels of other serum enzymes: Secondary | ICD-10-CM | POA: Diagnosis not present

## 2023-05-14 DIAGNOSIS — E785 Hyperlipidemia, unspecified: Secondary | ICD-10-CM

## 2023-05-14 LAB — LIPID PANEL
Chol/HDL Ratio: 2.7 ratio (ref 0.0–4.4)
Cholesterol, Total: 148 mg/dL (ref 100–199)
HDL: 55 mg/dL (ref >39)
LDL Chol Calc (NIH): 74 mg/dL (ref 0–99)
Triglycerides: 102 mg/dL (ref 0–149)
VLDL Cholesterol Cal: 19 mg/dL (ref 5–40)

## 2023-05-14 LAB — CMP14+EGFR
ALT: 21 IU/L (ref 0–32)
AST: 20 IU/L (ref 0–40)
Albumin: 4.7 g/dL (ref 3.8–4.9)
Alkaline Phosphatase: 122 IU/L — ABNORMAL HIGH (ref 44–121)
BUN/Creatinine Ratio: 17 (ref 9–23)
BUN: 15 mg/dL (ref 6–24)
Bilirubin Total: 0.5 mg/dL (ref 0.0–1.2)
CO2: 25 mmol/L (ref 20–29)
Calcium: 9.9 mg/dL (ref 8.7–10.2)
Chloride: 100 mmol/L (ref 96–106)
Creatinine, Ser: 0.86 mg/dL (ref 0.57–1.00)
Globulin, Total: 2.5 g/dL (ref 1.5–4.5)
Glucose: 93 mg/dL (ref 70–99)
Potassium: 5 mmol/L (ref 3.5–5.2)
Sodium: 137 mmol/L (ref 134–144)
Total Protein: 7.2 g/dL (ref 6.0–8.5)
eGFR: 78 mL/min/{1.73_m2} (ref >59)

## 2023-05-14 LAB — HEMOGLOBIN A1C
Est. average glucose Bld gHb Est-mCnc: 114 mg/dL
Hgb A1c MFr Bld: 5.6 % (ref 4.8–5.6)

## 2023-05-14 MED ORDER — AMPHETAMINE-DEXTROAMPHET ER 30 MG PO CP24: 30.0000 mg | ORAL_CAPSULE | ORAL | 0 refills | Status: AC

## 2023-05-14 MED ORDER — OZEMPIC (2 MG/DOSE) 8 MG/3ML ~~LOC~~ SOPN
2.0000 mg | PEN_INJECTOR | SUBCUTANEOUS | 1 refills | Status: DC
  Filled 2023-05-14: qty 3, 28d supply, fill #0

## 2023-05-14 NOTE — Assessment & Plan Note (Signed)
 Diabetes associated with hypertension, hyperlipidemia, obesity, and neurological disease  Carmen Mcintyre is reminded of the importance of commitment to daily physical activity for 30 minutes or more, as able and the need to limit carbohydrate intake to 30 to 60 grams per meal to help with blood sugar control.   The need to take medication as prescribed, test blood sugar as directed, and to call between visits if there is a concern that blood sugar is uncontrolled is also discussed.   Carmen Mcintyre is reminded of the importance of daily foot exam, annual eye examination, and good blood sugar, blood pressure and cholesterol control.     Latest Ref Rng & Units 05/13/2023   11:17 AM 08/25/2022    9:24 AM 05/26/2022   12:59 PM 02/25/2022    9:11 AM 02/21/2022    8:37 AM  Diabetic Labs  HbA1c 4.8 - 5.6 % 5.6  5.8  5.6   6.3   Micro/Creat Ratio 0 - 29 mg/g creat    7    Chol 100 - 199 mg/dL 161  096    045   HDL >40 mg/dL 55  50    52   Calc LDL 0 - 99 mg/dL 74  55    78   Triglycerides 0 - 149 mg/dL 981  191    478   Creatinine 0.57 - 1.00 mg/dL 2.95  6.21  3.08   6.57       05/14/2023    1:28 PM 04/02/2023   12:15 PM 04/02/2023   11:30 AM 01/21/2023    2:41 PM 10/21/2022    4:00 PM 08/26/2022    8:23 AM 08/18/2022    2:43 PM  BP/Weight  Systolic BP 121 135 143 137 132 104 119  Diastolic BP 73 80 80 71 70 69 75  Wt. (Lbs) 163  161.5 164.04 165 160.12 157  BMI 30.8 kg/m2  31.54 kg/m2 31 kg/m2 31.18 kg/m2 30.25 kg/m2 29.66 kg/m2      Latest Ref Rng & Units 04/09/2023   12:00 AM 04/07/2022   12:00 AM  Foot/eye exam completion dates  Eye Exam No Retinopathy No Retinopathy     No Retinopathy         This result is from an external source.

## 2023-05-14 NOTE — Patient Instructions (Addendum)
 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 If you develop chest pain, have severe difficulty breathing, or feel very tired, stop exercising immediately and seek medical attention   Thanks for choosing [facility], we consider it a privelige to serve you.

## 2023-05-18 ENCOUNTER — Other Ambulatory Visit (HOSPITAL_BASED_OUTPATIENT_CLINIC_OR_DEPARTMENT_OTHER): Payer: Self-pay

## 2023-05-18 ENCOUNTER — Other Ambulatory Visit: Payer: Self-pay

## 2023-05-18 ENCOUNTER — Other Ambulatory Visit: Payer: Self-pay | Admitting: Family Medicine

## 2023-05-18 LAB — MICROALBUMIN / CREATININE URINE RATIO
Creatinine, Urine: 117.6 mg/dL
Microalb/Creat Ratio: 3 mg/g{creat} (ref 0–29)
Microalbumin, Urine: 3.6 ug/mL

## 2023-05-18 MED ORDER — VENLAFAXINE HCL ER 75 MG PO CP24
75.0000 mg | ORAL_CAPSULE | Freq: Every day | ORAL | 2 refills | Status: DC
Start: 2023-05-18 — End: 2023-10-13
  Filled 2023-05-18 (×2): qty 90, 90d supply, fill #0
  Filled 2023-08-24: qty 90, 90d supply, fill #1

## 2023-05-18 NOTE — Telephone Encounter (Signed)
 Copied from CRM [id number]. Topic: Clinical - Medication Refill >> May 18, 2023  2:43 PM Alessandra Bevels wrote: Most Recent Primary Care Visit:  Provider: Kerri Perches  Department: [facility]  Visit Type: OFFICE VISIT  Date: 05/14/2023  Medication:  venlafaxine XR (EFFEXOR-XR) 75 MG 24 hr capsule    Has the patient contacted their pharmacy? Yes (Agent: If no, request that the patient contact the pharmacy for the refill. If patient does not wish to contact the pharmacy document the reason why and proceed with request.) (Agent: If yes, when and what did the pharmacy advise?)  Is this the correct pharmacy for this prescription? Yes If no, delete pharmacy and type the correct one.  This is the patient's preferred pharmacy:  [facility] [address] [place] [address] Phone: [phone] Fax: [phone]    Has the prescription been filled recently? Yes  Is the patient out of the medication? Yes  Has the patient been seen for an appointment in the last year OR does the patient have an upcoming appointment? Yes  Can we respond through MyChart? Yes  Agent: Please be advised that Rx refills may take up to 3 business days. We ask that you follow-up with your pharmacy.

## 2023-06-07 DIAGNOSIS — K753 Granulomatous hepatitis, not elsewhere classified: Secondary | ICD-10-CM | POA: Insufficient documentation

## 2023-06-07 MED ORDER — ALPRAZOLAM 0.25 MG PO TABS
0.2500 mg | ORAL_TABLET | Freq: Two times a day (BID) | ORAL | 0 refills | Status: DC | PRN
  Filled 2023-06-07 – 2023-06-22 (×2): qty 42, 21d supply, fill #0

## 2023-06-07 MED ORDER — AMPHETAMINE-DEXTROAMPHET ER 30 MG PO CP24: 30.0000 mg | ORAL_CAPSULE | ORAL | 0 refills | Status: AC

## 2023-06-07 NOTE — Assessment & Plan Note (Signed)
  Patient re-educated about  the importance of commitment to a  minimum of 150 minutes of exercise per week as able.  The importance of healthy food choices with portion control discussed, as well as eating regularly and within a 12 hour window most days. The need to choose "clean , green" food 50 to 75% of the time is discussed, as well as to make water the primary drink and set a goal of 64 ounces water daily.       05/14/2023    1:28 PM 04/02/2023   11:30 AM 01/21/2023    2:41 PM  Weight /BMI  Weight 163 lb 161 lb 8 oz 164 lb 0.6 oz  Height 5\' 1"  (1.549 m) 5' (1.524 m) 5\' 1"  (1.549 m)  BMI 30.8 kg/m2 31.54 kg/m2 31 kg/m2    Essentially unchanged

## 2023-06-07 NOTE — Progress Notes (Signed)
 Carmen Mcintyre     MRN: [id number]      DOB: 14-Feb-1966  Chief Complaint  Patient presents with   Hypertension    Follow up visit    Diabetes    Has been off the ozempic for 6 weeks and needs it called in. Either the same dose or if she has to start back over     HPI Carmen Mcintyre is here for follow up and re-evaluation of chronic medical conditions, medication management and review of any available recent lab and radiology data.  Preventive health is updated, specifically  Cancer screening and Immunization.   Will be  travelling out of the country , again to [place] for extended period, requests xanax for anxity , also needs early refill on chronic medications Abnormal alk phos and granulaoma liver discussed decision taken to have GI evaL Wants and needs to resume pozempic, needs refill ROS Denies recent fever or chills. Denies sinus pressure, nasal congestion, ear pain or sore throat. Denies chest congestion, productive cough or wheezing. Denies chest pains, palpitations and leg swelling Denies abdominal pain, nausea, vomiting,diarrhea or constipation.   Denies dysuria, frequency, hesitancy or incontinence. Denies joint pain, swelling and limitation in mobility. Denies headaches, seizures, numbness, or tingling. Denies depression,uncontrolled anxiety  anxiety or insomnia. Denies skin break down or rash.   PE  BP 121/73   Pulse 69   Resp 16   Ht 5\' 1"  (1.549 m)   Wt 163 lb (73.9 kg)   LMP 08/22/2016   SpO2 95%   BMI 30.80 kg/m   Patient alert and oriented and in no cardiopulmonary distress.  HEENT: No facial asymmetry, EOMI,     Neck supple .  Chest: Clear to auscultation bilaterally.  CVS: S1, S2 no murmurs, no S3.Regular rate.  ABD: Soft non tender.   Ext: No edema  MS: Adequate ROM spine, shoulders, hips and knees.  Skin: Intact, no ulcerations or rash noted.  Psych: Good eye contact, normal affect. Memory intact not anxious or depressed appearing.  CNS:  CN 2-12 intact, power,  normal throughout.no focal deficits noted.   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 is reminded of the importance of commitment to daily physical activity for 30 minutes or more, as able and the need to limit carbohydrate intake to 30 to 60 grams per meal to help with blood sugar control.   The need to take medication as prescribed, test blood sugar as directed, and to call between visits if there is a concern that blood sugar is uncontrolled is also discussed.   [name] is reminded of the importance of daily foot exam, annual eye examination, and good blood sugar, blood pressure and cholesterol control.     Latest Ref Rng & Units 05/13/2023   11:17 AM 08/25/2022    9:24 AM 05/26/2022   12:59 PM 02/25/2022    9:11 AM 02/21/2022    8:37 AM  Diabetic Labs  HbA1c 4.8 - 5.6 % 5.6  5.8  5.6   6.3   Micro/Creat Ratio 0 - 29 mg/g creat    7    Chol 100 - 199 mg/dL 782  956    213   HDL >08 mg/dL 55  50    52   Calc LDL 0 - 99 mg/dL 74  55    78   Triglycerides 0 - 149 mg/dL 657  162    117   Creatinine 0.57 - 1.00 mg/dL 1.61  0.96  0.45   4.09       05/14/2023    1:28 PM 04/02/2023   12:15 PM 04/02/2023   11:30 AM 01/21/2023    2:41 PM 10/21/2022    4:00 PM 08/26/2022    8:23 AM 08/18/2022    2:43 PM  BP/Weight  Systolic BP 121 135 143 137 132 104 119  Diastolic BP 73 80 80 71 70 69 75  Wt. (Lbs) 163  161.5 164.04 165 160.12 157  BMI 30.8 kg/m2  31.54 kg/m2 31 kg/m2 31.18 kg/m2 30.25 kg/m2 29.66 kg/m2      Latest Ref Rng & Units 04/09/2023   12:00 AM 04/07/2022   12:00 AM  Foot/eye exam completion dates  Eye Exam No Retinopathy No Retinopathy     No Retinopathy         This result is from an external source.

## 2023-06-07 NOTE — Assessment & Plan Note (Signed)
 Hyperlipidemia:Low fat diet discussed and encouraged.   Lipid Panel  Lab Results  Component Value Date   CHOL 148 05/13/2023   HDL 55 05/13/2023   LDLCALC 74 05/13/2023   TRIG 102 05/13/2023   CHOLHDL 2.7 05/13/2023     Controlled, no change in medication

## 2023-06-07 NOTE — Assessment & Plan Note (Signed)
 Refer GI for eval

## 2023-06-07 NOTE — Assessment & Plan Note (Signed)
 Increase dose of adderall

## 2023-06-07 NOTE — Assessment & Plan Note (Signed)
 Followed by pulmonary

## 2023-06-07 NOTE — Assessment & Plan Note (Signed)
 Controlled, no change in medication DASH diet and commitment to daily physical activity for a minimum of 30 minutes discussed and encouraged, as a part of hypertension management. The importance of attaining a healthy weight is also discussed.     05/14/2023    1:28 PM 04/02/2023   12:15 PM 04/02/2023   11:30 AM 01/21/2023    2:41 PM 10/21/2022    4:00 PM 08/26/2022    8:23 AM 08/18/2022    2:43 PM  BP/Weight  Systolic BP 121 135 143 137 132 104 119  Diastolic BP 73 80 80 71 70 69 75  Wt. (Lbs) 163  161.5 164.04 165 160.12 157  BMI 30.8 kg/m2  31.54 kg/m2 31 kg/m2 31.18 kg/m2 30.25 kg/m2 29.66 kg/m2

## 2023-06-07 NOTE — Assessment & Plan Note (Signed)
 Increased anxiety with upcoming travel outside of the country, returning for 1 month to [place]. Requests limited xanax as before , both for flying as well as for use if needed while [place] seas. Limited quantity prescribed as before

## 2023-06-07 NOTE — Assessment & Plan Note (Addendum)
 Refer GI, granuloma noted on Ct scan of abdomen in 2024, elevated alk phos persists refer GI

## 2023-06-08 ENCOUNTER — Other Ambulatory Visit: Payer: Self-pay

## 2023-06-08 ENCOUNTER — Other Ambulatory Visit (HOSPITAL_BASED_OUTPATIENT_CLINIC_OR_DEPARTMENT_OTHER): Payer: Self-pay

## 2023-06-18 ENCOUNTER — Other Ambulatory Visit (HOSPITAL_BASED_OUTPATIENT_CLINIC_OR_DEPARTMENT_OTHER): Payer: Self-pay

## 2023-06-22 ENCOUNTER — Other Ambulatory Visit: Payer: Self-pay | Admitting: Family Medicine

## 2023-06-22 ENCOUNTER — Other Ambulatory Visit (HOSPITAL_BASED_OUTPATIENT_CLINIC_OR_DEPARTMENT_OTHER): Payer: Self-pay

## 2023-06-22 ENCOUNTER — Encounter (HOSPITAL_BASED_OUTPATIENT_CLINIC_OR_DEPARTMENT_OTHER): Payer: Self-pay

## 2023-06-22 ENCOUNTER — Encounter: Payer: Self-pay | Admitting: Family Medicine

## 2023-06-23 ENCOUNTER — Other Ambulatory Visit: Payer: Self-pay | Admitting: Internal Medicine

## 2023-06-23 ENCOUNTER — Other Ambulatory Visit (HOSPITAL_BASED_OUTPATIENT_CLINIC_OR_DEPARTMENT_OTHER): Payer: Self-pay

## 2023-06-23 ENCOUNTER — Ambulatory Visit: Payer: No Typology Code available for payment source | Admitting: Family Medicine

## 2023-06-23 DIAGNOSIS — T753XXS Motion sickness, sequela: Secondary | ICD-10-CM

## 2023-06-23 MED ORDER — SCOPOLAMINE 1 MG/3DAYS TD PT72
1.0000 | MEDICATED_PATCH | TRANSDERMAL | 2 refills | Status: DC
  Filled 2023-06-23: qty 10, 30d supply, fill #0

## 2023-07-03 ENCOUNTER — Other Ambulatory Visit (HOSPITAL_BASED_OUTPATIENT_CLINIC_OR_DEPARTMENT_OTHER): Payer: Self-pay

## 2023-07-05 ENCOUNTER — Other Ambulatory Visit: Payer: Self-pay | Admitting: Medical Genetics

## 2023-07-13 ENCOUNTER — Telehealth: Payer: Self-pay | Admitting: Family Medicine

## 2023-07-13 NOTE — Telephone Encounter (Signed)
 Patient says she needs refills on adderall  and everything else that is due for a refill sent to [facility] says she is going out of the country next week for 3 weeks. Please advise Thank you

## 2023-07-14 ENCOUNTER — Other Ambulatory Visit (HOSPITAL_BASED_OUTPATIENT_CLINIC_OR_DEPARTMENT_OTHER): Payer: Self-pay

## 2023-07-14 ENCOUNTER — Encounter: Payer: Self-pay | Admitting: Family Medicine

## 2023-07-14 MED ORDER — AMPHETAMINE-DEXTROAMPHETAMINE 20 MG PO TABS
20.0000 mg | ORAL_TABLET | Freq: Every day | ORAL | 0 refills | Status: DC
  Filled 2023-07-14: qty 15, 15d supply, fill #0
  Filled 2023-07-14: qty 75, 75d supply, fill #0

## 2023-07-14 MED ORDER — AMPHETAMINE-DEXTROAMPHET ER 30 MG PO CP24
30.0000 mg | ORAL_CAPSULE | ORAL | 0 refills | Status: AC
  Filled 2023-07-14: qty 90, 90d supply, fill #0

## 2023-07-15 ENCOUNTER — Other Ambulatory Visit (HOSPITAL_BASED_OUTPATIENT_CLINIC_OR_DEPARTMENT_OTHER): Payer: Self-pay

## 2023-07-22 ENCOUNTER — Other Ambulatory Visit (HOSPITAL_BASED_OUTPATIENT_CLINIC_OR_DEPARTMENT_OTHER): Payer: Self-pay

## 2023-07-22 MED ORDER — TRETINOIN 0.1 % EX CREA
1.0000 | TOPICAL_CREAM | Freq: Every evening | CUTANEOUS | 3 refills | Status: AC
  Filled 2023-07-22: qty 45, 30d supply, fill #0

## 2023-08-01 ENCOUNTER — Other Ambulatory Visit (HOSPITAL_BASED_OUTPATIENT_CLINIC_OR_DEPARTMENT_OTHER): Payer: Self-pay

## 2023-08-19 ENCOUNTER — Encounter: Payer: Self-pay | Admitting: Family Medicine

## 2023-08-24 ENCOUNTER — Other Ambulatory Visit (HOSPITAL_BASED_OUTPATIENT_CLINIC_OR_DEPARTMENT_OTHER): Payer: Self-pay

## 2023-09-21 ENCOUNTER — Other Ambulatory Visit (HOSPITAL_COMMUNITY)
Admission: RE | Admit: 2023-09-21 | Discharge: 2023-09-21 | Disposition: A | Discharge status: Home / Self Care | Payer: Self-pay | Source: Ambulatory Visit | Attending: Medical Genetics | Admitting: Medical Genetics

## 2023-09-28 LAB — GENECONNECT MOLECULAR SCREEN: Genetic Analysis Overall Interpretation: NEGATIVE

## 2023-10-12 ENCOUNTER — Telehealth: Payer: Self-pay

## 2023-10-12 DIAGNOSIS — E785 Hyperlipidemia, unspecified: Secondary | ICD-10-CM

## 2023-10-12 DIAGNOSIS — E1169 Type 2 diabetes mellitus with other specified complication: Secondary | ICD-10-CM

## 2023-10-12 DIAGNOSIS — E1159 Type 2 diabetes mellitus with other circulatory complications: Secondary | ICD-10-CM

## 2023-10-12 DIAGNOSIS — R7989 Other specified abnormal findings of blood chemistry: Secondary | ICD-10-CM

## 2023-10-12 NOTE — Telephone Encounter (Signed)
Order given to [place].

## 2023-10-12 NOTE — Telephone Encounter (Signed)
 I have printed lab order for Carmen Mcintyre, please give this to lab  tech, as the pt has already had blood drawn earlier today so lab tech is waiting on the printed lab order Thank you

## 2023-10-12 NOTE — Addendum Note (Signed)
 Addended by: ANTONETTA QUANT E on: 10/12/2023 12:50 PM   Modules accepted: Orders

## 2023-10-12 NOTE — Telephone Encounter (Signed)
 Pt stopped by to have blood work drawn before appt tomorrow. States she always has labs before an appt. No labs were ordered last OV, did you want labs obtained? Lab tech went ahead and drew her blood. Please advise

## 2023-10-12 NOTE — Telephone Encounter (Signed)
 CBC, TSH, Vit D, HBA1C, lipid, cmp and EGFr

## 2023-10-13 ENCOUNTER — Other Ambulatory Visit (HOSPITAL_BASED_OUTPATIENT_CLINIC_OR_DEPARTMENT_OTHER): Payer: Self-pay

## 2023-10-13 ENCOUNTER — Encounter: Payer: Self-pay | Admitting: Family Medicine

## 2023-10-13 ENCOUNTER — Ambulatory Visit (INDEPENDENT_AMBULATORY_CARE_PROVIDER_SITE_OTHER): Admitting: Family Medicine

## 2023-10-13 ENCOUNTER — Other Ambulatory Visit: Payer: Self-pay

## 2023-10-13 ENCOUNTER — Ambulatory Visit: Payer: Self-pay | Admitting: Family Medicine

## 2023-10-13 VITALS — BP 109/70 | HR 83 | Resp 18 | Ht 61.0 in | Wt 163.1 lb

## 2023-10-13 DIAGNOSIS — E785 Hyperlipidemia, unspecified: Secondary | ICD-10-CM | POA: Diagnosis not present

## 2023-10-13 DIAGNOSIS — G9332 Myalgic encephalomyelitis/chronic fatigue syndrome: Secondary | ICD-10-CM

## 2023-10-13 DIAGNOSIS — K753 Granulomatous hepatitis, not elsewhere classified: Secondary | ICD-10-CM

## 2023-10-13 DIAGNOSIS — R9402 Abnormal brain scan: Secondary | ICD-10-CM

## 2023-10-13 DIAGNOSIS — E66811 Obesity, class 1: Secondary | ICD-10-CM | POA: Diagnosis not present

## 2023-10-13 DIAGNOSIS — I10 Essential (primary) hypertension: Secondary | ICD-10-CM

## 2023-10-13 DIAGNOSIS — Z8673 Personal history of transient ischemic attack (TIA), and cerebral infarction without residual deficits: Secondary | ICD-10-CM

## 2023-10-13 DIAGNOSIS — E1169 Type 2 diabetes mellitus with other specified complication: Secondary | ICD-10-CM

## 2023-10-13 DIAGNOSIS — R748 Abnormal levels of other serum enzymes: Secondary | ICD-10-CM

## 2023-10-13 LAB — CMP14+EGFR
ALT: 18 IU/L (ref 0–32)
AST: 19 IU/L (ref 0–40)
Albumin: 4.4 g/dL (ref 3.8–4.9)
Alkaline Phosphatase: 123 IU/L — ABNORMAL HIGH (ref 44–121)
BUN/Creatinine Ratio: 16 (ref 9–23)
BUN: 13 mg/dL (ref 6–24)
Bilirubin Total: 0.6 mg/dL (ref 0.0–1.2)
CO2: 24 mmol/L (ref 20–29)
Calcium: 9.7 mg/dL (ref 8.7–10.2)
Chloride: 100 mmol/L (ref 96–106)
Creatinine, Ser: 0.8 mg/dL (ref 0.57–1.00)
Globulin, Total: 2.5 g/dL (ref 1.5–4.5)
Glucose: 126 mg/dL — ABNORMAL HIGH (ref 70–99)
Potassium: 5 mmol/L (ref 3.5–5.2)
Sodium: 139 mmol/L (ref 134–144)
Total Protein: 6.9 g/dL (ref 6.0–8.5)
eGFR: 85 mL/min/1.73 (ref >59)

## 2023-10-13 LAB — CBC WITH DIFFERENTIAL/PLATELET
Basophils Absolute: 0.1 x10E3/uL (ref 0.0–0.2)
Basos: 1 %
EOS (ABSOLUTE): 0.1 x10E3/uL (ref 0.0–0.4)
Eos: 1 %
Hematocrit: 44.3 % (ref 34.0–46.6)
Hemoglobin: 14.6 g/dL (ref 11.1–15.9)
Immature Grans (Abs): 0 x10E3/uL (ref 0.0–0.1)
Immature Granulocytes: 0 %
Lymphocytes Absolute: 2.4 x10E3/uL (ref 0.7–3.1)
Lymphs: 39 %
MCH: 31.8 pg (ref 26.6–33.0)
MCHC: 33 g/dL (ref 31.5–35.7)
MCV: 97 fL (ref 79–97)
Monocytes Absolute: 0.4 x10E3/uL (ref 0.1–0.9)
Monocytes: 6 %
Neutrophils Absolute: 3.3 x10E3/uL (ref 1.4–7.0)
Neutrophils: 53 %
Platelets: 263 x10E3/uL (ref 150–450)
RBC: 4.59 x10E6/uL (ref 3.77–5.28)
RDW: 11.9 % (ref 11.7–15.4)
WBC: 6.2 x10E3/uL (ref 3.4–10.8)

## 2023-10-13 LAB — LIPID PANEL W/O CHOL/HDL RATIO
Cholesterol, Total: 141 mg/dL (ref 100–199)
HDL: 53 mg/dL (ref >39)
LDL Chol Calc (NIH): 71 mg/dL (ref 0–99)
Triglycerides: 91 mg/dL (ref 0–149)
VLDL Cholesterol Cal: 17 mg/dL (ref 5–40)

## 2023-10-13 LAB — TSH: TSH: 0.859 u[IU]/mL (ref 0.450–4.500)

## 2023-10-13 LAB — VITAMIN D 25 HYDROXY (VIT D DEFICIENCY, FRACTURES): Vit D, 25-Hydroxy: 43.2 ng/mL (ref 30.0–100.0)

## 2023-10-13 MED ORDER — AMPHETAMINE-DEXTROAMPHET ER 30 MG PO CP24
30.0000 mg | ORAL_CAPSULE | ORAL | 0 refills | Status: AC
  Filled 2024-03-09 (×2): qty 90, 90d supply, fill #0

## 2023-10-13 MED ORDER — AMPHETAMINE-DEXTROAMPHETAMINE 20 MG PO TABS
20.0000 mg | ORAL_TABLET | Freq: Every day | ORAL | 0 refills | Status: AC
  Filled 2023-10-13: qty 90, 90d supply, fill #0

## 2023-10-13 MED ORDER — AMPHETAMINE-DEXTROAMPHET ER 30 MG PO CP24
30.0000 mg | ORAL_CAPSULE | ORAL | 0 refills | Status: AC
  Filled 2023-10-13: qty 90, 90d supply, fill #0

## 2023-10-13 MED ORDER — AMPHETAMINE-DEXTROAMPHETAMINE 20 MG PO TABS
20.0000 mg | ORAL_TABLET | Freq: Every day | ORAL | 0 refills | Status: AC
  Filled 2024-01-16: qty 90, 90d supply, fill #0

## 2023-10-13 MED ORDER — VENLAFAXINE HCL ER 75 MG PO CP24
75.0000 mg | ORAL_CAPSULE | Freq: Every day | ORAL | 3 refills | Status: AC
  Filled 2023-10-13 – 2023-12-03 (×2): qty 90, 90d supply, fill #0
  Filled 2024-03-09: qty 90, 90d supply, fill #1

## 2023-10-13 NOTE — Assessment & Plan Note (Signed)
 Diabetes associated with hypertension, hyperlipidemia, obesity, and neurological disease  Ms. Tyrell is reminded of the importance of commitment to daily physical activity for 30 minutes or more, as able and the need to limit carbohydrate intake to 30 to 60 grams per meal to help with blood sugar control.   The need to take medication as prescribed, test blood sugar as directed, and to call between visits if there is a concern that blood sugar is uncontrolled is also discussed.   Ms. Olenick is reminded of the importance of daily foot exam, annual eye examination, and good blood sugar, blood pressure and cholesterol control.     Latest Ref Rng & Units 10/12/2023   11:01 AM 05/14/2023    3:52 PM 05/13/2023   11:17 AM 08/25/2022    9:24 AM 05/26/2022   12:59 PM  Diabetic Labs  HbA1c 4.8 - 5.6 %   5.6  5.8  5.6   Micro/Creat Ratio 0 - 29 mg/g creat  3      Chol 100 - 199 mg/dL 858   851  867    HDL >60 mg/dL 53   55  50    Calc LDL 0 - 99 mg/dL 71   74  55    Triglycerides 0 - 149 mg/dL 91   897  837    Creatinine 0.57 - 1.00 mg/dL 9.19   9.13  9.23  9.23       10/13/2023    8:45 AM 05/14/2023    1:28 PM 04/02/2023   12:15 PM 04/02/2023   11:30 AM 01/21/2023    2:41 PM 10/21/2022    4:00 PM 08/26/2022    8:23 AM  BP/Weight  Systolic BP 109 121 135 143 137 132 104  Diastolic BP 70 73 80 80 71 70 69  Wt. (Lbs) 163.08 163  161.5 164.04 165 160.12  BMI 30.81 kg/m2 30.8 kg/m2  31.54 kg/m2 31 kg/m2 31.18 kg/m2 30.25 kg/m2      Latest Ref Rng & Units 04/09/2023   12:00 AM 04/07/2022   12:00 AM  Foot/eye exam completion dates  Eye Exam No Retinopathy No Retinopathy     No Retinopathy         This result is from an external source.

## 2023-10-13 NOTE — Assessment & Plan Note (Signed)
 Controlled, no change in medication DASH diet and commitment to daily physical activity for a minimum of 30 minutes discussed and encouraged, as a part of hypertension management. The importance of attaining a healthy weight is also discussed.     10/13/2023    8:45 AM 05/14/2023    1:28 PM 04/02/2023   12:15 PM 04/02/2023   11:30 AM 01/21/2023    2:41 PM 10/21/2022    4:00 PM 08/26/2022    8:23 AM  BP/Weight  Systolic BP 109 121 135 143 137 132 104  Diastolic BP 70 73 80 80 71 70 69  Wt. (Lbs) 163.08 163  161.5 164.04 165 160.12  BMI 30.81 kg/m2 30.8 kg/m2  31.54 kg/m2 31 kg/m2 31.18 kg/m2 30.25 kg/m2

## 2023-10-13 NOTE — Assessment & Plan Note (Signed)
 Has GI evaluation scheduled in 11/2023

## 2023-10-13 NOTE — Assessment & Plan Note (Signed)
 Hyperlipidemia:Low fat diet discussed and encouraged.   Lipid Panel  Lab Results  Component Value Date   CHOL 141 10/12/2023   HDL 53 10/12/2023   LDLCALC 71 10/12/2023   TRIG 91 10/12/2023   CHOLHDL 2.7 05/13/2023     Controlled, no change in medication

## 2023-10-13 NOTE — Patient Instructions (Addendum)
 F/U in 4 months  Schedule annual exam  WITH GYNE AS DISCUSSED, correction, has had gyne exam in 03/2023  Appointment upcoming with GI  YOU WILL BE REFERRED FOR BRAIN SCAN  Best with healthier lifestyle changes  Thanks for choosing [facility], we consider it a privelige to serve you.    Nurse pls add HBa1C to recent labs  Thanks for choosing [facility], we consider it a privelige to serve you.

## 2023-10-13 NOTE — Assessment & Plan Note (Signed)
 Abnormal brain scan reported in 2021, scattered punctate white matter intensities,re eval with imaging study

## 2023-10-13 NOTE — Assessment & Plan Note (Signed)
 Controlled, no change in medication

## 2023-10-13 NOTE — Progress Notes (Signed)
 Carmen Mcintyre     MRN: [id number]      DOB: [birth year]  Chief Complaint  Patient presents with   Medical Management of Chronic Issues    4 month follow up     HPI [name] is here for follow up and re-evaluation of chronic medical conditions, medication management and review of any available recent lab and radiology data.  Preventive health is updated, specifically  Cancer screening and Immunization.   Has upcoming GI evaluation, alk phos still slightly elevated Ready to get rept brain scan  The PT intolerant of all GLP1 meds, has been managing diabetes with lifestyle, will consider jardiance if hBA1C is above 7 for sure    ROS Denies recent fever or chills. Denies sinus pressure, nasal congestion, ear pain or sore throat. Denies chest congestion, productive cough or wheezing. Denies chest pains, palpitations and leg swelling Denies abdominal pain, nausea, vomiting,diarrhea or constipation.   Denies dysuria, frequency, hesitancy or incontinence. Denies joint pain, swelling and limitation in mobility. Denies headaches, seizures, numbness, or tingling. Denies depression, anxiety or insomnia. Denies skin break down or rash.   PE  BP 109/70   Pulse 83   Resp 18   Ht 5' 1 (1.549 m)   Wt 163 lb 1.3 oz (74 kg)   LMP 08/22/2016   SpO2 97%   BMI 30.81 kg/m   Patient alert and oriented and in no cardiopulmonary distress.  HEENT: No facial asymmetry, EOMI,     Neck supple .  Chest: Clear to auscultation bilaterally.  CVS: S1, S2 no murmurs, no S3.Regular rate.   Ext: No edema  MS: Adequate ROM spine, shoulders, hips and knees.  Skin: Intact, no ulcerations or rash noted.  Psych: Good eye contact, normal affect. Memory intact not anxious or depressed appearing.  CNS: CN 2-12 intact, power,  normal throughout.no focal deficits noted.   Assessment & Plan  Hyperlipidemia LDL goal <70 Hyperlipidemia:Low fat diet discussed and encouraged.   Lipid Panel  Lab  Results  Component Value Date   CHOL 141 10/12/2023   HDL 53 10/12/2023   LDLCALC 71 10/12/2023   TRIG 91 10/12/2023   CHOLHDL 2.7 05/13/2023     Controlled, no change in medication   Obesity (BMI 30.0-34.9)  Patient re-educated about  the importance of commitment to a  minimum of 150 minutes of exercise per week as able.  The importance of healthy food choices with portion control discussed, as well as eating regularly and within a 12 hour window most days. The need to choose clean , green food 50 to 75% of the time is discussed, as well as to make water the primary drink and set a goal of 64 ounces water daily.       10/13/2023    8:45 AM 05/14/2023    1:28 PM 04/02/2023   11:30 AM  Weight /BMI  Weight 163 lb 1.3 oz 163 lb 161 lb 8 oz  Height 5' 1 (1.549 m) 5' 1 (1.549 m) 5' (1.524 m)  BMI 30.81 kg/m2 30.8 kg/m2 31.54 kg/m2    Increase exercise planned  Type 2 diabetes mellitus with other specified complication (HCC) Diabetes associated with hypertension, hyperlipidemia, obesity, and neurological disease  [name] is reminded of the importance of commitment to daily physical activity for 30 minutes or more, as able and the need to limit carbohydrate intake to 30 to 60 grams per meal to help with blood sugar control.   The need to  take medication as prescribed, test blood sugar as directed, and to call between visits if there is a concern that blood sugar is uncontrolled is also discussed.   [name] is reminded of the importance of daily foot exam, annual eye examination, and good blood sugar, blood pressure and cholesterol control.     Latest Ref Rng & Units 10/12/2023   11:01 AM 05/14/2023    3:52 PM 05/13/2023   11:17 AM 08/25/2022    9:24 AM 05/26/2022   12:59 PM  Diabetic Labs  HbA1c 4.8 - 5.6 %   5.6  5.8  5.6   Micro/Creat Ratio 0 - 29 mg/g creat  3      Chol 100 - 199 mg/dL 858   851  867    HDL >60 mg/dL 53   55  50    Calc LDL 0 - 99 mg/dL 71   74  55     Triglycerides 0 - 149 mg/dL 91   897  837    Creatinine 0.57 - 1.00 mg/dL 9.19   9.13  9.23  9.23       10/13/2023    8:45 AM 05/14/2023    1:28 PM 04/02/2023   12:15 PM 04/02/2023   11:30 AM 01/21/2023    2:41 PM 10/21/2022    4:00 PM 08/26/2022    8:23 AM  BP/Weight  Systolic BP 109 121 135 143 137 132 104  Diastolic BP 70 73 80 80 71 70 69  Wt. (Lbs) 163.08 163  161.5 164.04 165 160.12  BMI 30.81 kg/m2 30.8 kg/m2  31.54 kg/m2 31 kg/m2 31.18 kg/m2 30.25 kg/m2      Latest Ref Rng & Units 04/09/2023   12:00 AM 04/07/2022   12:00 AM  Foot/eye exam completion dates  Eye Exam No Retinopathy No Retinopathy     No Retinopathy         This result is from an external source.        Chronic fatigue disorder Controlled, no change in medication   HTN (hypertension) Controlled, no change in medication DASH diet and commitment to daily physical activity for a minimum of 30 minutes discussed and encouraged, as a part of hypertension management. The importance of attaining a healthy weight is also discussed.     10/13/2023    8:45 AM 05/14/2023    1:28 PM 04/02/2023   12:15 PM 04/02/2023   11:30 AM 01/21/2023    2:41 PM 10/21/2022    4:00 PM 08/26/2022    8:23 AM  BP/Weight  Systolic BP 109 121 135 143 137 132 104  Diastolic BP 70 73 80 80 71 70 69  Wt. (Lbs) 163.08 163  161.5 164.04 165 160.12  BMI 30.81 kg/m2 30.8 kg/m2  31.54 kg/m2 31 kg/m2 31.18 kg/m2 30.25 kg/m2       Elevated alkaline phosphatase level Has GI evaluation scheduled in 11/2023  Granuloma of liver Has GI appt scheduled in 11/2023  History of stroke Abnormal brain scan reported in 2021, scattered punctate white matter intensities,re eval with imaging study

## 2023-10-13 NOTE — Assessment & Plan Note (Signed)
  Patient re-educated about  the importance of commitment to a  minimum of 150 minutes of exercise per week as able.  The importance of healthy food choices with portion control discussed, as well as eating regularly and within a 12 hour window most days. The need to choose clean , green food 50 to 75% of the time is discussed, as well as to make water the primary drink and set a goal of 64 ounces water daily.       10/13/2023    8:45 AM 05/14/2023    1:28 PM 04/02/2023   11:30 AM  Weight /BMI  Weight 163 lb 1.3 oz 163 lb 161 lb 8 oz  Height 5' 1 (1.549 m) 5' 1 (1.549 m) 5' (1.524 m)  BMI 30.81 kg/m2 30.8 kg/m2 31.54 kg/m2    Increase exercise planned

## 2023-10-13 NOTE — Assessment & Plan Note (Signed)
 Has GI appt scheduled in 11/2023

## 2023-10-16 ENCOUNTER — Other Ambulatory Visit: Payer: Self-pay

## 2023-10-16 DIAGNOSIS — I10 Essential (primary) hypertension: Secondary | ICD-10-CM

## 2023-10-16 DIAGNOSIS — E785 Hyperlipidemia, unspecified: Secondary | ICD-10-CM

## 2023-10-16 DIAGNOSIS — E1169 Type 2 diabetes mellitus with other specified complication: Secondary | ICD-10-CM

## 2023-10-16 LAB — SPECIMEN STATUS REPORT

## 2023-10-16 LAB — HEMOGLOBIN A1C
Est. average glucose Bld gHb Est-mCnc: 134 mg/dL
Hgb A1c MFr Bld: 6.3 % — ABNORMAL HIGH (ref 4.8–5.6)

## 2023-10-20 ENCOUNTER — Encounter: Payer: Self-pay | Admitting: Family Medicine

## 2023-10-30 ENCOUNTER — Other Ambulatory Visit (HOSPITAL_BASED_OUTPATIENT_CLINIC_OR_DEPARTMENT_OTHER): Payer: Self-pay

## 2023-11-02 ENCOUNTER — Other Ambulatory Visit (HOSPITAL_BASED_OUTPATIENT_CLINIC_OR_DEPARTMENT_OTHER): Payer: Self-pay

## 2023-11-04 ENCOUNTER — Ambulatory Visit (HOSPITAL_COMMUNITY)

## 2023-11-15 NOTE — Progress Notes (Unsigned)
 11/15/2023 Carmen Mcintyre [id number] May 29, 1965   CHIEF COMPLAINT: Elevated alk phos level   HISTORY OF PRESENT ILLNESS: Carmen Mcintyre is a 58 year old  female with a past medical history of anxiety, depression, hyperlipidemia, hypertension, CVA,  Past cholecystectomy.      Latest Ref Rng & Units 10/12/2023   11:01 AM 08/25/2022    9:24 AM 07/08/2021    2:32 PM  CBC  WBC 3.4 - 10.8 x10E3/uL 6.2  7.8  7.4   Hemoglobin 11.1 - 15.9 g/dL 85.3  85.5  85.3   Hematocrit 34.0 - 46.6 % 44.3  43.1  42.4   Platelets 150 - 450 x10E3/uL 263  281  283        Latest Ref Rng & Units 10/12/2023   11:01 AM 05/13/2023   11:17 AM 08/25/2022    9:24 AM  Hepatic Function  Total Protein 6.0 - 8.5 g/dL 6.9  7.2  6.7   Albumin 3.8 - 4.9 g/dL 4.4  4.7  4.4   AST 0 - 40 IU/L 19  20  16    ALT 0 - 32 IU/L 18  21  17    Alk Phosphatase 44 - 121 IU/L 123  122  119   Total Bilirubin 0.0 - 1.2 mg/dL 0.6  0.5  0.5      Colonoscopy 07/09/2017 by Dr. Albertus: - The entire examined colon is normal on direct and retroflexion views.  - No specimens collected. - 10 year recall colonoscopy  Past Medical History:  Diagnosis Date   Allergy    Anxiety    Depression    Dyslipidemia    Hyperlipidemia    Hypertension    Insomnia    Stroke [facility])    Urinary incontinence    Past Surgical History:  Procedure Laterality Date   BREAST BIOPSY Left    benign   BUBBLE STUDY  02/25/2019   Procedure: BUBBLE STUDY;  Surgeon: Raford Riggs, MD;  Location: [facility] ENDOSCOPY;  Service: Cardiovascular;;   CAESAREAN  1997/2003   CESAREAN SECTION N/A    [employer] 03/17/2020   CHOLECYSTECTOMY  2005   CHOLECYSTECTOMY     FOOT SURGERY Right 01/2021   plantar fascitis   LEFT BREAST MASS REMOVED  1998   RT LOWER LOBECTOMY SECONDARY IN INFECTION  2006   TEE WITHOUT CARDIOVERSION N/A 02/25/2019   Procedure: TRANSESOPHAGEAL ECHOCARDIOGRAM (TEE);  Surgeon: Raford Riggs, MD;  Location: [facility] ENDOSCOPY;  Service: Cardiovascular;   Laterality: N/A;   Social History:  Family History:    reports that she has never smoked. She has never used smokeless tobacco. She reports current alcohol use. She reports that she does not use drugs. family history includes Arthritis in an other family member; Breast cancer in her maternal aunt; Cancer in her paternal grandmother; Diabetes in her mother.  Allergies  Allergen Reactions   Hydrocodone  Nausea Only   Mounjaro  [Tirzepatide ] Nausea And Vomiting      Outpatient Encounter Medications as of 11/16/2023  Medication Sig   amphetamine -dextroamphetamine  (ADDERALL  XR) 30 MG 24 hr capsule Take 1 capsule (30 mg total) by mouth every morning.   amphetamine -dextroamphetamine  (ADDERALL  XR) 30 MG 24 hr capsule Take 1 capsule (30 mg total) by mouth every morning.   amphetamine -dextroamphetamine  (ADDERALL  XR) 30 MG 24 hr capsule Take 1 capsule (30 mg total) by mouth every morning.   amphetamine -dextroamphetamine  (ADDERALL  XR) 30 MG 24 hr capsule Take 1 capsule (30 mg total) by mouth every morning.  amphetamine -dextroamphetamine  (ADDERALL  XR) 30 MG 24 hr capsule Take 1 capsule (30 mg total) by mouth every morning.   [START ON 01/11/2024] amphetamine -dextroamphetamine  (ADDERALL  XR) 30 MG 24 hr capsule Take 1 capsule (30 mg total) by mouth every morning.   amphetamine -dextroamphetamine  (ADDERALL ) 20 MG tablet Take 1 tablet (20 mg total) by mouth daily.   [START ON 01/11/2024] amphetamine -dextroamphetamine  (ADDERALL ) 20 MG tablet Take 1 tablet (20 mg total) by mouth daily.   aspirin  81 MG tablet Take 1 tablet (81 mg total) by mouth daily.   atorvastatin  (LIPITOR) 20 MG tablet Take 1 tablet (20 mg total) by mouth daily.   conjugated estrogens  (PREMARIN ) vaginal cream USE 0.5 GRAM VAGINALLY EVERY NIGHT FOR 2 WEEKS THEN 2 TO 3 TIMES WEEKLY   lisinopril  (ZESTRIL ) 20 MG tablet Take 1 tablet (20 mg total) by mouth daily.   tretinoin  (RETIN-A ) 0.1 % cream Apply 1 application topically to face every  evening.   tretinoin  (RETIN-A ) 0.1 % cream Apply topically to the face every evening.   venlafaxine  XR (EFFEXOR -XR) 75 MG 24 hr capsule Take 1 capsule (75 mg total) by mouth daily with breakfast.   No facility-administered encounter medications on file as of 11/16/2023.    REVIEW OF SYSTEMS:  Gen: Denies fever, sweats or chills. No weight loss.  CV: Denies chest pain, palpitations or edema. Resp: Denies cough, shortness of breath of hemoptysis.  GI: Denies heartburn, dysphagia, stomach or lower abdominal pain. No diarrhea or constipation.  GU: Denies urinary burning, blood in urine, increased urinary frequency or incontinence. MS: Denies joint pain, muscles aches or weakness. Derm: Denies rash, itchiness, skin lesions or unhealing ulcers. Psych: Denies depression, anxiety, memory loss or confusion. Heme: Denies bruising, easy bleeding. Neuro:  Denies headaches, dizziness or paresthesias. Endo:  Denies any problems with DM, thyroid  or adrenal function.  PHYSICAL EXAM: LMP 08/22/2016  General: in no acute distress. Head: Normocephalic and atraumatic. Eyes:  Sclerae non-icteric, conjunctive pink. Ears: Normal auditory acuity. Mouth: Dentition intact. No ulcers or lesions.  Neck: Supple, no lymphadenopathy or thyromegaly.  Lungs: Clear bilaterally to auscultation without wheezes, crackles or rhonchi. Heart: Regular rate and rhythm. No murmur, rub or gallop appreciated.  Abdomen: Soft, nontender, nondistended. No masses. No hepatosplenomegaly. Normoactive bowel sounds x 4 quadrants.  Rectal: Deferred.  Musculoskeletal: Symmetrical with no gross deformities. Skin: Warm and dry. No rash or lesions on visible extremities. Extremities: No edema. Neurological: Alert oriented x 4, no focal deficits.  Psychological: Alert and cooperative. Normal mood and affect.  ASSESSMENT AND PLAN:    CC:  Carmen Rollene BRAVO, MD

## 2023-11-16 ENCOUNTER — Encounter: Payer: Self-pay | Admitting: Nurse Practitioner

## 2023-11-16 ENCOUNTER — Ambulatory Visit (INDEPENDENT_AMBULATORY_CARE_PROVIDER_SITE_OTHER): Admitting: Nurse Practitioner

## 2023-11-16 ENCOUNTER — Other Ambulatory Visit (INDEPENDENT_AMBULATORY_CARE_PROVIDER_SITE_OTHER)

## 2023-11-16 VITALS — BP 142/78 | HR 106 | Ht 61.0 in | Wt 166.2 lb

## 2023-11-16 DIAGNOSIS — Z800 Family history of malignant neoplasm of digestive organs: Secondary | ICD-10-CM | POA: Diagnosis not present

## 2023-11-16 DIAGNOSIS — R748 Abnormal levels of other serum enzymes: Secondary | ICD-10-CM

## 2023-11-16 LAB — HEPATIC FUNCTION PANEL
ALT: 19 U/L (ref 0–35)
AST: 21 U/L (ref 0–37)
Albumin: 4.5 g/dL (ref 3.5–5.2)
Alkaline Phosphatase: 98 U/L (ref 39–117)
Bilirubin, Direct: 0.1 mg/dL (ref 0.0–0.3)
Total Bilirubin: 0.6 mg/dL (ref 0.2–1.2)
Total Protein: 7.5 g/dL (ref 6.0–8.3)

## 2023-11-16 LAB — GAMMA GT: GGT: 19 U/L (ref 7–51)

## 2023-11-16 NOTE — Patient Instructions (Signed)
 Your provider has requested that you go to the basement level for lab work before leaving today. Press B on the elevator. The lab is located at the first door on the left as you exit the elevator.  Due to recent changes in healthcare laws, you may see the results of your imaging and laboratory studies on MyChart before your provider has had a chance to review them.  We understand that in some cases there may be results that are confusing or concerning to you. Not all laboratory results come back in the same time frame and the provider may be waiting for multiple results in order to interpret others.  Please give us  48 hours in order for your provider to thoroughly review all the results before contacting the office for clarification of your results.    Once your labs are reviewed, we will let you kow if there are any additional recommendations.   Thank you for trusting me with your gastrointestinal care!   Elida Shawl, CRNP    _______________________________________________________  If your blood pressure at your visit was 140/90 or greater, please contact your primary care physician to follow up on this.  _______________________________________________________  If you are age 65 or older, your body mass index should be between 23-30. Your Body mass index is 31.41 kg/m. If this is out of the aforementioned range listed, please consider follow up with your Primary Care Provider.  If you are age 15 or younger, your body mass index should be between 19-25. Your Body mass index is 31.41 kg/m. If this is out of the aformentioned range listed, please consider follow up with your Primary Care Provider.   ________________________________________________________  The [facility] providers would like to encourage you to use MYCHART to communicate with providers for non-urgent requests or questions.  Due to long hold times on the telephone, sending your provider a message by [facility] may be  a faster and more efficient way to get a response.  Please allow 48 business hours for a response.  Please remember that this is for non-urgent requests.  _______________________________________________________  [facility] is using a team-based approach to care.  Your team is made up of your doctor and two to three APPS. Our APPS (Nurse Practitioners and Physician Assistants) work with your physician to ensure care continuity for you. They are fully qualified to address your health concerns and develop a treatment plan. They communicate directly with your gastroenterologist to care for you. Seeing the Advanced Practice Practitioners on your physician's team can help you by facilitating care more promptly, often allowing for earlier appointments, access to diagnostic testing, procedures, and other specialty referrals.

## 2023-11-18 ENCOUNTER — Ambulatory Visit: Payer: Self-pay | Admitting: Nurse Practitioner

## 2023-11-22 NOTE — Progress Notes (Signed)
 Linda/Dottie, pls contact patient and let her know Dr. Pamula recommendations per his addendum note. No further liver imaging required. LFS are normal. Patient to follow up with pulmonologist regarding pulmonary nodules. THX.

## 2023-11-22 NOTE — Progress Notes (Signed)
 Addendum: Reviewed and agree with assessment and management plan. 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 Liver enzymes have normalized and therefore no further imaging is felt necessary She will have follow-up with pulmonary regarding her lung nodules Carmen Mcintyre, Gordy HERO, MD

## 2023-11-23 ENCOUNTER — Telehealth: Payer: Self-pay

## 2023-11-23 NOTE — Telephone Encounter (Signed)
 Pt notified of comments below via mychart.:  Cara Elida HERO, NP at 11/16/2023  9:30 AM  Status: Signed  Angell Honse/Dottie, pls contact patient and let her know Dr. Pamula recommendations per his addendum note. No further liver imaging required. LFS are normal. Patient to follow up with pulmonologist regarding pulmonary nodules. THX.     Albertus Gordy HERO, MD at 11/16/2023  9:30 AM  Status: Signed  Addendum: Reviewed and agree with assessment and management plan. 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 Liver enzymes have normalized and therefore no further imaging is felt necessary She will have follow-up with pulmonary regarding her lung nodules Pyrtle, Gordy HERO, MD

## 2023-11-23 NOTE — Telephone Encounter (Signed)
 Carmen Mcintyre

## 2023-11-25 ENCOUNTER — Telehealth: Payer: Self-pay

## 2023-11-25 NOTE — Telephone Encounter (Signed)
 Copied from CRM [id number]. Topic: Clinical - Lab/Test Results >> Nov 25, 2023 11:17 AM Emylou G wrote: Reason for CRM: Vonnie HERO W/[facility] called in regards to referral.. needs clinical notes in order to do the MRI ( authorization ) anything related to the stroke.[facility] fax: [phone] Call back if need: [phone] .[facility] attn Kaylah M

## 2023-11-25 NOTE — Telephone Encounter (Signed)
 Faxed notes and brain MRI attn Kaylah to number provided

## 2023-12-03 ENCOUNTER — Other Ambulatory Visit (HOSPITAL_BASED_OUTPATIENT_CLINIC_OR_DEPARTMENT_OTHER): Payer: Self-pay

## 2023-12-03 MED FILL — Estrogens, Conjugated Vaginal Cream 0.625 MG/GM: VAGINAL | 30 days supply | Qty: 30 | Fill #0 | Status: AC

## 2024-01-05 ENCOUNTER — Encounter: Payer: Self-pay | Admitting: Family Medicine

## 2024-01-15 ENCOUNTER — Other Ambulatory Visit (HOSPITAL_BASED_OUTPATIENT_CLINIC_OR_DEPARTMENT_OTHER): Payer: Self-pay

## 2024-01-15 ENCOUNTER — Other Ambulatory Visit: Payer: Self-pay | Admitting: Family Medicine

## 2024-01-15 MED ORDER — LISINOPRIL 20 MG PO TABS
20.0000 mg | ORAL_TABLET | Freq: Every day | ORAL | 2 refills | Status: AC
  Filled 2024-01-15: qty 90, 90d supply, fill #0

## 2024-01-16 ENCOUNTER — Other Ambulatory Visit (HOSPITAL_BASED_OUTPATIENT_CLINIC_OR_DEPARTMENT_OTHER): Payer: Self-pay

## 2024-01-26 ENCOUNTER — Ambulatory Visit: Payer: Self-pay | Admitting: Family Medicine

## 2024-02-15 ENCOUNTER — Other Ambulatory Visit: Payer: Self-pay

## 2024-02-15 DIAGNOSIS — R748 Abnormal levels of other serum enzymes: Secondary | ICD-10-CM

## 2024-03-01 ENCOUNTER — Other Ambulatory Visit (HOSPITAL_BASED_OUTPATIENT_CLINIC_OR_DEPARTMENT_OTHER): Payer: Self-pay

## 2024-03-01 MED ORDER — ALBUTEROL SULFATE HFA 108 (90 BASE) MCG/ACT IN AERS
2.0000 | INHALATION_SPRAY | Freq: Four times a day (QID) | RESPIRATORY_TRACT | 5 refills | Status: AC | PRN
  Filled 2024-03-01: qty 6.7, 25d supply, fill #0

## 2024-03-09 ENCOUNTER — Other Ambulatory Visit (HOSPITAL_BASED_OUTPATIENT_CLINIC_OR_DEPARTMENT_OTHER): Payer: Self-pay

## 2024-03-18 ENCOUNTER — Ambulatory Visit: Admitting: Family Medicine

## 2024-03-29 ENCOUNTER — Other Ambulatory Visit: Payer: Self-pay | Admitting: Family Medicine

## 2024-03-29 ENCOUNTER — Other Ambulatory Visit (HOSPITAL_BASED_OUTPATIENT_CLINIC_OR_DEPARTMENT_OTHER): Payer: Self-pay

## 2024-03-29 ENCOUNTER — Other Ambulatory Visit: Payer: Self-pay

## 2024-03-29 ENCOUNTER — Ambulatory Visit: Admitting: Family Medicine

## 2024-03-29 MED ORDER — ATORVASTATIN CALCIUM 20 MG PO TABS
20.0000 mg | ORAL_TABLET | Freq: Every day | ORAL | 3 refills | Status: AC
  Filled 2024-03-29: qty 90, 90d supply, fill #0

## 2024-03-31 ENCOUNTER — Encounter: Payer: Self-pay | Admitting: Family Medicine

## 2024-03-31 ENCOUNTER — Other Ambulatory Visit (HOSPITAL_BASED_OUTPATIENT_CLINIC_OR_DEPARTMENT_OTHER): Payer: Self-pay

## 2024-03-31 MED ORDER — ALPRAZOLAM 0.25 MG PO TABS
0.2500 mg | ORAL_TABLET | Freq: Two times a day (BID) | ORAL | 1 refills | Status: AC | PRN
  Filled 2024-03-31: qty 30, 15d supply, fill #0

## 2024-04-01 ENCOUNTER — Other Ambulatory Visit (HOSPITAL_BASED_OUTPATIENT_CLINIC_OR_DEPARTMENT_OTHER): Payer: Self-pay

## 2024-04-14 ENCOUNTER — Ambulatory Visit: Admitting: Family Medicine

## 2024-06-09 ENCOUNTER — Ambulatory Visit: Admitting: Family Medicine
